# Patient Record
Sex: Male | Born: 1935
Health system: Southern US, Community
[De-identification: ages and names within clinical notes are randomized; demographics above are authoritative.]

## PROBLEM LIST (undated history)

## (undated) DIAGNOSIS — J90 Pleural effusion, not elsewhere classified: Secondary | ICD-10-CM

## (undated) DIAGNOSIS — R55 Syncope and collapse: Secondary | ICD-10-CM

## (undated) DIAGNOSIS — I4819 Other persistent atrial fibrillation: Secondary | ICD-10-CM

## (undated) DIAGNOSIS — N400 Enlarged prostate without lower urinary tract symptoms: Secondary | ICD-10-CM

## (undated) DIAGNOSIS — I10 Essential (primary) hypertension: Secondary | ICD-10-CM

## (undated) DIAGNOSIS — Z8719 Personal history of other diseases of the digestive system: Secondary | ICD-10-CM

## (undated) DIAGNOSIS — K219 Gastro-esophageal reflux disease without esophagitis: Secondary | ICD-10-CM

## (undated) DIAGNOSIS — I5032 Chronic diastolic (congestive) heart failure: Secondary | ICD-10-CM

## (undated) DIAGNOSIS — N281 Cyst of kidney, acquired: Secondary | ICD-10-CM

## (undated) DIAGNOSIS — I428 Other cardiomyopathies: Secondary | ICD-10-CM

## (undated) DIAGNOSIS — T8859XA Other complications of anesthesia, initial encounter: Secondary | ICD-10-CM

## (undated) DIAGNOSIS — G629 Polyneuropathy, unspecified: Secondary | ICD-10-CM

## (undated) DIAGNOSIS — T4145XA Adverse effect of unspecified anesthetic, initial encounter: Secondary | ICD-10-CM

## (undated) DIAGNOSIS — I5042 Chronic combined systolic (congestive) and diastolic (congestive) heart failure: Secondary | ICD-10-CM

## (undated) DIAGNOSIS — C4490 Unspecified malignant neoplasm of skin, unspecified: Secondary | ICD-10-CM

## (undated) DIAGNOSIS — M1990 Unspecified osteoarthritis, unspecified site: Secondary | ICD-10-CM

## (undated) DIAGNOSIS — I2510 Atherosclerotic heart disease of native coronary artery without angina pectoris: Secondary | ICD-10-CM

## (undated) DIAGNOSIS — R0600 Dyspnea, unspecified: Secondary | ICD-10-CM

## (undated) DIAGNOSIS — I441 Atrioventricular block, second degree: Secondary | ICD-10-CM

## (undated) HISTORY — PX: APPENDECTOMY: SHX54

## (undated) HISTORY — DX: Chronic combined systolic (congestive) and diastolic (congestive) heart failure: I50.42

## (undated) HISTORY — PX: CATARACT EXTRACTION, BILATERAL: SHX1313

## (undated) HISTORY — PX: TOTAL HIP ARTHROPLASTY: SHX124

## (undated) HISTORY — PX: BACK SURGERY: SHX140

## (undated) HISTORY — PX: LUMBAR DISC SURGERY: SHX700

## (undated) HISTORY — PX: CHOLECYSTECTOMY: SHX55

## (undated) HISTORY — PX: SKIN CANCER EXCISION: SHX779

## (undated) HISTORY — PX: JOINT REPLACEMENT: SHX530

## (undated) HISTORY — DX: Other cardiomyopathies: I42.8

## (undated) HISTORY — PX: TONSILLECTOMY: SUR1361

## (undated) HISTORY — DX: Atherosclerotic heart disease of native coronary artery without angina pectoris: I25.10

## (undated) HISTORY — DX: Other persistent atrial fibrillation: I48.19

---

## 1997-12-01 ENCOUNTER — Other Ambulatory Visit: Admission: RE | Admit: 1997-12-01 | Discharge: 1997-12-01 | Payer: Self-pay | Admitting: Urology

## 1998-12-29 ENCOUNTER — Ambulatory Visit (HOSPITAL_COMMUNITY): Admission: RE | Admit: 1998-12-29 | Discharge: 1998-12-29 | Payer: Self-pay | Admitting: Gastroenterology

## 2001-03-09 ENCOUNTER — Encounter: Payer: Self-pay | Admitting: Family Medicine

## 2001-03-09 ENCOUNTER — Encounter: Admission: RE | Admit: 2001-03-09 | Discharge: 2001-03-09 | Payer: Self-pay | Admitting: Family Medicine

## 2001-03-16 ENCOUNTER — Encounter: Payer: Self-pay | Admitting: Family Medicine

## 2001-03-16 ENCOUNTER — Encounter: Admission: RE | Admit: 2001-03-16 | Discharge: 2001-03-16 | Payer: Self-pay | Admitting: Family Medicine

## 2001-05-18 ENCOUNTER — Encounter: Payer: Self-pay | Admitting: Neurosurgery

## 2001-05-20 ENCOUNTER — Encounter: Payer: Self-pay | Admitting: Neurosurgery

## 2001-05-20 ENCOUNTER — Inpatient Hospital Stay (HOSPITAL_COMMUNITY): Admission: RE | Admit: 2001-05-20 | Discharge: 2001-05-21 | Payer: Self-pay | Admitting: Neurosurgery

## 2008-05-11 ENCOUNTER — Encounter: Admission: RE | Admit: 2008-05-11 | Discharge: 2008-05-11 | Payer: Self-pay | Admitting: Neurosurgery

## 2008-07-12 ENCOUNTER — Encounter: Admission: RE | Admit: 2008-07-12 | Discharge: 2008-07-28 | Payer: Self-pay | Admitting: Neurology

## 2010-09-28 NOTE — Op Note (Signed)
[place]  Patient:    Frank Elliott, NOHR Visit Number: [id number] MRN: [id number]          Service Type: SUR Location: [id number] Attending Physician:  Barton Fanny Dictated by:   Hewitt Shorts, M.D. Proc. Date: 05/20/01 Admit Date:  05/20/2001                             Operative Report  PREOPERATIVE DIAGNOSIS:  Lumbar stenosis.  POSTOPERATIVE DIAGNOSIS:  Lumbar stenosis.  OPERATION PERFORMED:  L3 to S1 lumbar laminectomy with microdissection.  SURGEON:  Hewitt Shorts, M.D.  ASSISTANT:  Stefani Dama, M.D.  ANESTHESIA:  General endotracheal.  INDICATIONS FOR PROCEDURE:  The patient is a 77 year old man who presented with left lumbar radiculopathy and was found to have severe stenosis of the L4-5 level, marked stenosis at the L3-4 level and moderate stenosis at the L5-S1 level.  A decision was made to proceed with a multilevel decompressive lumbar laminectomy.  DESCRIPTION OF PROCEDURE:  The patient was brought to the operating room and placed under general endotracheal anesthesia.  The patient was turned to prone position.  The lumbar region was prepped with Betadine soap and solution and draped in a sterile fashion.  The midline was infiltrated with local anesthetic with epinephrine.  An x-ray was taken and the L3 to S1 levels identified.  A midline incision was made and carried down to subcutaneous tissues.  Bipolar cautery and electrocautery were used to maintain hemostasis. The lumbar fascia was incised bilaterally and the paraspinal muscles were dissected from the spinous processes and lamina in subperiosteal fascia. Self-retaining retractors were placed and another x-ray was taken to confirm the localization of the L3, L4, L5 and S1 spinous process and lamina.  We then proceeded with a multilevel lumbar laminectomy using double action rongeurs, the [facility] Max drill and Kerrison punches.  The microscope was  draped and brought into the field to provide additional magnification, illumination and visualization.  The remainder of the procedure was performed using microdissection and microsurgical technique.  There was a synovial cyst to the right side at L4-5 that was adherent to the dura and as the laminectomy was performed, a small rent in the dura occurred.  This was closed with a running 6-0 Prolene suture.  A watertight closure was achieved.  Tisseal was placed over the repair.  The laminectomy was extended from the inferior aspect of L3 to the superior aspect of S1 and laterally so as to decompress the foramina. Exuberant ligamentum flavum was carefully removed and in the end, good decompression was achieved throughout.  Hemostasis was established with the use of Gelfoam soaked in thrombin and once decompression was completed and hemostasis established, we proceeded with closure.  The paraspinal muscles were approximated with interrupted undyed 1 Vicryl suture and the vertebral fascia was closed with interrupted undyed 1 Vicryl sutures and the subcutaneous and subcuticular layer were closed with interrupted inverted 2-0 undyed Vicryl sutures.  Skin edges were approximated with Dermabond.  The patient tolerated the procedure well.  Estimated blood loss for this procedure was 300 cc.  Sponge, needle and instrument counts were correct.  Following surgery, the patient was turned back to supine position to be reversed from anesthetic, extubated and transferred to the recovery room for further care. ictated by:   Hewitt Shorts, M.D. Attending Physician:  Barton Fanny DD:  05/20/01 TD:  05/20/01  Job: [id number]

## 2011-09-09 DIAGNOSIS — K219 Gastro-esophageal reflux disease without esophagitis: Secondary | ICD-10-CM | POA: Diagnosis not present

## 2011-09-09 DIAGNOSIS — N401 Enlarged prostate with lower urinary tract symptoms: Secondary | ICD-10-CM | POA: Diagnosis not present

## 2011-09-09 DIAGNOSIS — I10 Essential (primary) hypertension: Secondary | ICD-10-CM | POA: Diagnosis not present

## 2011-09-09 DIAGNOSIS — Z125 Encounter for screening for malignant neoplasm of prostate: Secondary | ICD-10-CM | POA: Diagnosis not present

## 2011-09-09 DIAGNOSIS — Z1331 Encounter for screening for depression: Secondary | ICD-10-CM | POA: Diagnosis not present

## 2011-10-18 DIAGNOSIS — J209 Acute bronchitis, unspecified: Secondary | ICD-10-CM | POA: Diagnosis not present

## 2011-10-18 DIAGNOSIS — N401 Enlarged prostate with lower urinary tract symptoms: Secondary | ICD-10-CM | POA: Diagnosis not present

## 2011-10-31 DIAGNOSIS — Z85828 Personal history of other malignant neoplasm of skin: Secondary | ICD-10-CM | POA: Diagnosis not present

## 2011-10-31 DIAGNOSIS — L570 Actinic keratosis: Secondary | ICD-10-CM | POA: Diagnosis not present

## 2011-11-02 DIAGNOSIS — K5900 Constipation, unspecified: Secondary | ICD-10-CM | POA: Diagnosis not present

## 2011-11-02 DIAGNOSIS — R110 Nausea: Secondary | ICD-10-CM | POA: Diagnosis not present

## 2011-11-08 DIAGNOSIS — R110 Nausea: Secondary | ICD-10-CM | POA: Diagnosis not present

## 2011-11-08 DIAGNOSIS — R197 Diarrhea, unspecified: Secondary | ICD-10-CM | POA: Diagnosis not present

## 2011-11-15 DIAGNOSIS — R110 Nausea: Secondary | ICD-10-CM | POA: Diagnosis not present

## 2011-11-17 ENCOUNTER — Emergency Department (HOSPITAL_BASED_OUTPATIENT_CLINIC_OR_DEPARTMENT_OTHER): Payer: Medicare Other

## 2011-11-17 ENCOUNTER — Emergency Department (HOSPITAL_BASED_OUTPATIENT_CLINIC_OR_DEPARTMENT_OTHER)
Admission: EM | Admit: 2011-11-17 | Discharge: 2011-11-18 | Disposition: A | Discharge status: Home / Self Care | Payer: Medicare Other | Attending: Emergency Medicine | Admitting: Emergency Medicine

## 2011-11-17 ENCOUNTER — Encounter (HOSPITAL_BASED_OUTPATIENT_CLINIC_OR_DEPARTMENT_OTHER): Payer: Self-pay | Admitting: *Deleted

## 2011-11-17 DIAGNOSIS — N509 Disorder of male genital organs, unspecified: Secondary | ICD-10-CM | POA: Diagnosis not present

## 2011-11-17 DIAGNOSIS — Z79899 Other long term (current) drug therapy: Secondary | ICD-10-CM | POA: Diagnosis not present

## 2011-11-17 DIAGNOSIS — K219 Gastro-esophageal reflux disease without esophagitis: Secondary | ICD-10-CM | POA: Diagnosis not present

## 2011-11-17 DIAGNOSIS — I10 Essential (primary) hypertension: Secondary | ICD-10-CM | POA: Insufficient documentation

## 2011-11-17 DIAGNOSIS — N23 Unspecified renal colic: Secondary | ICD-10-CM | POA: Diagnosis not present

## 2011-11-17 DIAGNOSIS — N201 Calculus of ureter: Secondary | ICD-10-CM | POA: Diagnosis not present

## 2011-11-17 DIAGNOSIS — K5730 Diverticulosis of large intestine without perforation or abscess without bleeding: Secondary | ICD-10-CM | POA: Diagnosis not present

## 2011-11-17 DIAGNOSIS — R319 Hematuria, unspecified: Secondary | ICD-10-CM | POA: Diagnosis not present

## 2011-11-17 HISTORY — DX: Polyneuropathy, unspecified: G62.9

## 2011-11-17 HISTORY — DX: Gastro-esophageal reflux disease without esophagitis: K21.9

## 2011-11-17 HISTORY — DX: Essential (primary) hypertension: I10

## 2011-11-17 HISTORY — DX: Benign prostatic hyperplasia without lower urinary tract symptoms: N40.0

## 2011-11-17 LAB — URINALYSIS, ROUTINE W REFLEX MICROSCOPIC
Bilirubin Urine: NEGATIVE
Ketones, ur: 15 mg/dL — AB
Protein, ur: NEGATIVE mg/dL
Urobilinogen, UA: 2 mg/dL — ABNORMAL HIGH (ref 0.0–1.0)

## 2011-11-17 NOTE — ED Notes (Signed)
Pt c/o testicular pain since 3 pm with some pain with urination

## 2011-11-17 NOTE — ED Notes (Signed)
MD at bedside. 

## 2011-11-17 NOTE — ED Provider Notes (Signed)
History     CSN: [id number]  Arrival date & time 11/17/11  2245   First MD Initiated Contact with Patient 11/17/11 2322      Chief Complaint  Patient presents with  . Dysuria    (Consider location/radiation/quality/duration/timing/severity/associated sxs/prior treatment) HPI This is an 78 year old male who has been on finasteride for 7 months due to BPH. He is complaining of dysuria and urinating small amounts since this afternoon. The dysuria is described as pain inside his penis when he urinates. He denies fever or chills. He has nausea but this is chronic and he has an upper endoscopy scheduled later this week to evaluate that. The discomfort is moderate. He is not aware of any hematuria.  Past Medical History  Diagnosis Date  . GERD (gastroesophageal reflux disease)   . Hypertension   . Neuropathy   . BPH (benign prostatic hyperplasia)     Past Surgical History  Procedure Date  . Joint replacement   . Appendectomy   . Cholecystectomy   . Tonsillectomy   . Back surgery     History reviewed. No pertinent family history.  History  Substance Use Topics  . Smoking status: Not on file  . Smokeless tobacco: Not on file  . Alcohol Use: No      Review of Systems  All other systems reviewed and are negative.    Allergies  Amoxicillin; Aspirin; Sulfa antibiotics; and Whiskey  Home Medications   Current Outpatient Rx  Name Route Sig Dispense Refill  . AMLODIPINE BESYLATE 10 MG PO TABS Oral Take 10 mg by mouth daily.    Marland Kitchen FINASTERIDE 5 MG PO TABS Oral Take 5 mg by mouth daily.    Marland Kitchen GABAPENTIN 100 MG PO CAPS Oral Take 100 mg by mouth 3 (three) times daily.    Marland Kitchen OMEPRAZOLE 10 MG PO CPDR Oral Take 10 mg by mouth daily.      BP 147/86  Pulse 88  Temp 97.8 F (36.6 C) (Oral)  Resp 19  Ht 5\' 9"  (1.753 m)  Wt 168 lb (76.204 kg)  BMI 24.81 kg/m2  SpO2 99%  Physical Exam General: Well-developed, well-nourished male in no acute distress; appearance consistent  with age of record HENT: normocephalic, atraumatic Eyes: pupils equal round and reactive to light; extraocular muscles intact; lens implants Neck: supple Heart: regular rate and rhythm Lungs: clear to auscultation bilaterally Abdomen: soft; nondistended; nontender; no masses or hepatosplenomegaly; bowel sounds present GU: No flank tenderness; no testicular tenderness or mass; Tanner 4 male, circumcised; urethral meatal stenosis Rectal: Normal sphincter tone; prostate mildly tender, enlarged but symmetric Extremities: No deformity; full range of motion; possible edema of lower leg Neurologic: Awake, alert, mildly confused; motor function intact in all extremities and symmetric; no facial droop Skin: Warm and dry Psychiatric: Normal mood and affect    ED Course  Procedures (including critical care time)    MDM   Nursing notes and vitals signs, including pulse oximetry, reviewed.  Summary of this visit's results, reviewed by myself:  Labs:  Results for orders placed during the hospital encounter of 11/17/11  URINALYSIS, ROUTINE W REFLEX MICROSCOPIC      Component Value Range   Color, Urine YELLOW  YELLOW   APPearance CLEAR  CLEAR   Specific Gravity, Urine 1.015  1.005 - 1.030   pH 6.5  5.0 - 8.0   Glucose, UA NEGATIVE  NEGATIVE mg/dL   Hgb urine dipstick LARGE (*) NEGATIVE   Bilirubin Urine NEGATIVE  NEGATIVE  Ketones, ur 15 (*) NEGATIVE mg/dL   Protein, ur NEGATIVE  NEGATIVE mg/dL   Urobilinogen, UA 2.0 (*) 0.0 - 1.0 mg/dL   Nitrite NEGATIVE  NEGATIVE   Leukocytes, UA NEGATIVE  NEGATIVE  URINE MICROSCOPIC-ADD ON      Component Value Range   Squamous Epithelial / LPF RARE  RARE   WBC, UA 0-2  <3 WBC/hpf   RBC / HPF TOO NUMEROUS TO COUNT  <3 RBC/hpf   Bacteria, UA RARE  RARE   Ct Abdomen Pelvis Wo Contrast  11/18/2011  *RADIOLOGY REPORT*  Clinical Data: Hematuria and testicular pain.  CT ABDOMEN AND PELVIS WITHOUT CONTRAST  Technique:  Multidetector CT imaging of the  abdomen and pelvis was performed following the standard protocol without intravenous contrast.  Comparison: None.  Findings: Several low density lesions are associated with both kidneys.  Some are central and others are in the parenchyma.  Some of these can be characterized as simple cysts.  It would be difficult to exclude hydronephrosis.  No evidence of hydroureter.  2 mm calculus at the left ureteropelvic junction.  Post cholecystectomy.  Liver, spleen, pancreas, adrenal glands are within normal limits.  Diverticulosis of the sigmoid colon without evidence of diverticulitis.  Degenerative and postoperative changes in the lumbar spine.  L4 compression fracture of indeterminate age.  There is a collection of calcifications within the mesentery on image 45 associated with soft tissue stranding but no evidence of an associated mass.  This is of unknown significance.  It is felt to represent a benign finding.  IMPRESSION: 2 mm calculus at the left ureteral vesicle junction.  There are several hypodensities in both kidneys likely representing benign cysts.  Left sided hydronephrosis is difficult to exclude.  Original Report Authenticated By: Donavan Burnet, M.D.             Hanley Seamen, MD 11/18/11 [phone]

## 2011-11-18 MED ORDER — HYDROCODONE-ACETAMINOPHEN 5-325 MG PO TABS: 1.0000 | ORAL_TABLET | Freq: Four times a day (QID) | ORAL | Status: AC | PRN

## 2011-11-18 MED ORDER — IBUPROFEN 400 MG PO TABS
400.0000 mg | ORAL_TABLET | Freq: Once | ORAL | Status: AC
  Administered 2011-11-18: 400 mg via ORAL
  Filled 2011-11-18: qty 1

## 2011-11-18 MED ORDER — IBUPROFEN 400 MG PO TABS: ORAL_TABLET | ORAL | Status: DC

## 2011-11-19 LAB — URINE CULTURE
Colony Count: NO GROWTH
Culture: NO GROWTH

## 2011-11-21 DIAGNOSIS — N201 Calculus of ureter: Secondary | ICD-10-CM | POA: Diagnosis not present

## 2011-11-21 DIAGNOSIS — N281 Cyst of kidney, acquired: Secondary | ICD-10-CM | POA: Diagnosis not present

## 2011-11-21 DIAGNOSIS — N401 Enlarged prostate with lower urinary tract symptoms: Secondary | ICD-10-CM | POA: Diagnosis not present

## 2011-11-22 DIAGNOSIS — R110 Nausea: Secondary | ICD-10-CM | POA: Diagnosis not present

## 2011-11-27 DIAGNOSIS — K2960 Other gastritis without bleeding: Secondary | ICD-10-CM | POA: Diagnosis not present

## 2011-11-27 DIAGNOSIS — K222 Esophageal obstruction: Secondary | ICD-10-CM | POA: Diagnosis not present

## 2011-11-27 DIAGNOSIS — Z1211 Encounter for screening for malignant neoplasm of colon: Secondary | ICD-10-CM | POA: Diagnosis not present

## 2011-11-27 DIAGNOSIS — K319 Disease of stomach and duodenum, unspecified: Secondary | ICD-10-CM | POA: Diagnosis not present

## 2011-11-28 DIAGNOSIS — R110 Nausea: Secondary | ICD-10-CM | POA: Diagnosis not present

## 2011-11-28 DIAGNOSIS — Z96649 Presence of unspecified artificial hip joint: Secondary | ICD-10-CM | POA: Diagnosis not present

## 2011-11-28 DIAGNOSIS — R109 Unspecified abdominal pain: Secondary | ICD-10-CM | POA: Diagnosis not present

## 2011-12-12 DIAGNOSIS — K649 Unspecified hemorrhoids: Secondary | ICD-10-CM | POA: Diagnosis not present

## 2012-01-01 DIAGNOSIS — Z1211 Encounter for screening for malignant neoplasm of colon: Secondary | ICD-10-CM | POA: Diagnosis not present

## 2012-01-01 DIAGNOSIS — K6289 Other specified diseases of anus and rectum: Secondary | ICD-10-CM | POA: Diagnosis not present

## 2012-01-01 DIAGNOSIS — K599 Functional intestinal disorder, unspecified: Secondary | ICD-10-CM | POA: Diagnosis not present

## 2012-01-01 DIAGNOSIS — D126 Benign neoplasm of colon, unspecified: Secondary | ICD-10-CM | POA: Diagnosis not present

## 2012-01-23 DIAGNOSIS — Z23 Encounter for immunization: Secondary | ICD-10-CM | POA: Diagnosis not present

## 2012-02-06 DIAGNOSIS — H40019 Open angle with borderline findings, low risk, unspecified eye: Secondary | ICD-10-CM | POA: Diagnosis not present

## 2012-02-06 DIAGNOSIS — Z961 Presence of intraocular lens: Secondary | ICD-10-CM | POA: Diagnosis not present

## 2012-03-10 DIAGNOSIS — R609 Edema, unspecified: Secondary | ICD-10-CM | POA: Diagnosis not present

## 2012-03-10 DIAGNOSIS — N401 Enlarged prostate with lower urinary tract symptoms: Secondary | ICD-10-CM | POA: Diagnosis not present

## 2012-03-10 DIAGNOSIS — G609 Hereditary and idiopathic neuropathy, unspecified: Secondary | ICD-10-CM | POA: Diagnosis not present

## 2012-03-10 DIAGNOSIS — I10 Essential (primary) hypertension: Secondary | ICD-10-CM | POA: Diagnosis not present

## 2012-04-01 DIAGNOSIS — R0602 Shortness of breath: Secondary | ICD-10-CM | POA: Diagnosis not present

## 2012-04-06 DIAGNOSIS — R0609 Other forms of dyspnea: Secondary | ICD-10-CM | POA: Diagnosis not present

## 2012-04-06 DIAGNOSIS — R609 Edema, unspecified: Secondary | ICD-10-CM | POA: Diagnosis not present

## 2012-04-06 DIAGNOSIS — R0989 Other specified symptoms and signs involving the circulatory and respiratory systems: Secondary | ICD-10-CM | POA: Diagnosis not present

## 2012-04-06 DIAGNOSIS — J90 Pleural effusion, not elsewhere classified: Secondary | ICD-10-CM | POA: Diagnosis not present

## 2012-04-06 DIAGNOSIS — Z79899 Other long term (current) drug therapy: Secondary | ICD-10-CM | POA: Diagnosis not present

## 2012-04-16 DIAGNOSIS — I509 Heart failure, unspecified: Secondary | ICD-10-CM | POA: Diagnosis not present

## 2012-04-16 DIAGNOSIS — J90 Pleural effusion, not elsewhere classified: Secondary | ICD-10-CM | POA: Diagnosis not present

## 2012-04-17 ENCOUNTER — Emergency Department (HOSPITAL_COMMUNITY): Payer: Medicare Other

## 2012-04-17 ENCOUNTER — Encounter (HOSPITAL_COMMUNITY): Payer: Self-pay | Admitting: *Deleted

## 2012-04-17 ENCOUNTER — Inpatient Hospital Stay (HOSPITAL_COMMUNITY)
Admission: EM | Admit: 2012-04-17 | Discharge: 2012-04-20 | DRG: 292 | Disposition: A | Discharge status: Home Health | Payer: Medicare Other | Attending: Internal Medicine | Admitting: Internal Medicine

## 2012-04-17 DIAGNOSIS — I509 Heart failure, unspecified: Secondary | ICD-10-CM | POA: Diagnosis not present

## 2012-04-17 DIAGNOSIS — I10 Essential (primary) hypertension: Secondary | ICD-10-CM | POA: Diagnosis present

## 2012-04-17 DIAGNOSIS — J4489 Other specified chronic obstructive pulmonary disease: Secondary | ICD-10-CM | POA: Diagnosis not present

## 2012-04-17 DIAGNOSIS — K219 Gastro-esophageal reflux disease without esophagitis: Secondary | ICD-10-CM | POA: Diagnosis present

## 2012-04-17 DIAGNOSIS — R0602 Shortness of breath: Secondary | ICD-10-CM

## 2012-04-17 DIAGNOSIS — I5021 Acute systolic (congestive) heart failure: Principal | ICD-10-CM | POA: Diagnosis present

## 2012-04-17 DIAGNOSIS — G589 Mononeuropathy, unspecified: Secondary | ICD-10-CM | POA: Diagnosis present

## 2012-04-17 DIAGNOSIS — Z79899 Other long term (current) drug therapy: Secondary | ICD-10-CM

## 2012-04-17 DIAGNOSIS — Z886 Allergy status to analgesic agent status: Secondary | ICD-10-CM

## 2012-04-17 DIAGNOSIS — N400 Enlarged prostate without lower urinary tract symptoms: Secondary | ICD-10-CM | POA: Diagnosis present

## 2012-04-17 DIAGNOSIS — Z880 Allergy status to penicillin: Secondary | ICD-10-CM

## 2012-04-17 DIAGNOSIS — I428 Other cardiomyopathies: Secondary | ICD-10-CM | POA: Diagnosis present

## 2012-04-17 DIAGNOSIS — Z881 Allergy status to other antibiotic agents status: Secondary | ICD-10-CM

## 2012-04-17 DIAGNOSIS — J449 Chronic obstructive pulmonary disease, unspecified: Secondary | ICD-10-CM | POA: Diagnosis not present

## 2012-04-17 DIAGNOSIS — Z882 Allergy status to sulfonamides status: Secondary | ICD-10-CM

## 2012-04-17 DIAGNOSIS — I5032 Chronic diastolic (congestive) heart failure: Secondary | ICD-10-CM | POA: Diagnosis present

## 2012-04-17 HISTORY — DX: Pleural effusion, not elsewhere classified: J90

## 2012-04-17 HISTORY — DX: Dyspnea, unspecified: R06.00

## 2012-04-17 LAB — POCT I-STAT TROPONIN I: Troponin i, poc: 0.03 ng/mL (ref 0.00–0.08)

## 2012-04-17 LAB — URINALYSIS, ROUTINE W REFLEX MICROSCOPIC
Hgb urine dipstick: NEGATIVE
Protein, ur: NEGATIVE mg/dL
Specific Gravity, Urine: 1.02 (ref 1.005–1.030)
Urobilinogen, UA: 1 mg/dL (ref 0.0–1.0)

## 2012-04-17 LAB — COMPREHENSIVE METABOLIC PANEL
ALT: 15 U/L (ref 0–53)
BUN: 24 mg/dL — ABNORMAL HIGH (ref 6–23)
Calcium: 9.1 mg/dL (ref 8.4–10.5)
Creatinine, Ser: 1.1 mg/dL (ref 0.50–1.35)
GFR calc Af Amer: 70 mL/min — ABNORMAL LOW (ref >90)
Glucose, Bld: 105 mg/dL — ABNORMAL HIGH (ref 70–99)
Sodium: 141 mEq/L (ref 135–145)
Total Protein: 6.6 g/dL (ref 6.0–8.3)

## 2012-04-17 LAB — CBC WITH DIFFERENTIAL/PLATELET
Eosinophils Absolute: 0.2 10*3/uL (ref 0.0–0.7)
Eosinophils Relative: 2 % (ref 0–5)
Lymphs Abs: 1.4 10*3/uL (ref 0.7–4.0)
MCH: 34.7 pg — ABNORMAL HIGH (ref 26.0–34.0)
MCV: 96.8 fL (ref 78.0–100.0)
Monocytes Absolute: 0.6 10*3/uL (ref 0.1–1.0)
Platelets: 254 10*3/uL (ref 150–400)
RBC: 4.32 MIL/uL (ref 4.22–5.81)

## 2012-04-17 LAB — PRO B NATRIURETIC PEPTIDE: Pro B Natriuretic peptide (BNP): 6386 pg/mL — ABNORMAL HIGH (ref 0–450)

## 2012-04-17 MED ORDER — FUROSEMIDE 10 MG/ML IJ SOLN
40.0000 mg | Freq: Once | INTRAMUSCULAR | Status: AC
  Administered 2012-04-18: 40 mg via INTRAVENOUS
  Filled 2012-04-17: qty 4

## 2012-04-17 NOTE — ED Notes (Signed)
Pt was dx with right sided pleural effusion on 11/20, family was told it was fairly large.  Tonight when checked by nurse, pt was tachycardia and SOB on exertion.  Has lost approx 10 lbs in 1 week.

## 2012-04-17 NOTE — ED Notes (Signed)
MD at bedside. 

## 2012-04-17 NOTE — ED Provider Notes (Addendum)
History     CSN: [id number]  Arrival date & time 04/17/12  2107   First MD Initiated Contact with Patient 04/17/12 2303      Chief Complaint  Patient presents with  . Tachycardia  . Pleural Effusion    (Consider location/radiation/quality/duration/timing/severity/associated sxs/prior treatment) HPI... increasing shortness of breath over the past several weeks. Seen by primary care Dr. and placed on Lasix for "fluid in his lungs."  10 pound weight loss in past week.  No previous MI or congestive heart failure. Exertion makes symptoms worse. Severity is moderate  Past Medical History  Diagnosis Date  . GERD (gastroesophageal reflux disease)   . Hypertension   . Neuropathy   . BPH (benign prostatic hyperplasia)   . Pleural effusion     dx 04/01/12  . Dyspnea     Past Surgical History  Procedure Date  . Joint replacement   . Appendectomy   . Cholecystectomy   . Tonsillectomy   . Back surgery     History reviewed. No pertinent family history.  History  Substance Use Topics  . Smoking status: Not on file  . Smokeless tobacco: Not on file  . Alcohol Use: No      Review of Systems  All other systems reviewed and are negative.    Allergies  Aspirin; Whiskey; Amoxicillin; Avelox; Lisinopril; Penicillins; Sulfa antibiotics; and Zithromax  Home Medications   Current Outpatient Rx  Name  Route  Sig  Dispense  Refill  . GABAPENTIN 300 MG PO CAPS   Oral   Take 300 mg by mouth 3 (three) times daily.         Marland Kitchen OMEPRAZOLE 20 MG PO CPDR   Oral   Take 20 mg by mouth 2 (two) times daily.         Marland Kitchen CALCIUM POLYCARBOPHIL 625 MG PO TABS   Oral   Take 625 mg by mouth at bedtime as needed. For constipation         . ALIGN PO   Oral   Take 1 capsule by mouth daily.         Marland Kitchen TAMSULOSIN HCL 0.4 MG PO CAPS   Oral   Take 0.4 mg by mouth daily.         . TRIAMCINOLONE ACETONIDE 55 MCG/ACT NA INHA   Nasal   Place 2 sprays into the nose at bedtime.            BP 154/86  Pulse 101  Temp 98.2 F (36.8 C)  Resp 21  SpO2 97%  Physical Exam  Nursing note and vitals reviewed. Constitutional: He is oriented to person, place, and time. He appears well-developed and well-nourished.       Patient is thin, no obvious dyspnea  HENT:  Head: Normocephalic and atraumatic.  Eyes: Conjunctivae normal and EOM are normal. Pupils are equal, round, and reactive to light.  Neck: Normal range of motion. Neck supple.  Cardiovascular: Normal rate, regular rhythm and normal heart sounds.   Pulmonary/Chest: Effort normal and breath sounds normal.  Abdominal: Soft. Bowel sounds are normal.  Musculoskeletal: Normal range of motion.  Neurological: He is alert and oriented to person, place, and time.  Skin: Skin is warm and dry.       2+ peripheral edema  Psychiatric: He has a normal mood and affect.    ED Course  Procedures (including critical care time)  Labs Reviewed  URINALYSIS, ROUTINE W REFLEX MICROSCOPIC - Abnormal; Notable for the following:  Bilirubin Urine SMALL (*)     All other components within normal limits  CBC WITH DIFFERENTIAL - Abnormal; Notable for the following:    MCH 34.7 (*)     All other components within normal limits  COMPREHENSIVE METABOLIC PANEL - Abnormal; Notable for the following:    Glucose, Bld 105 (*)     BUN 24 (*)     GFR calc non Af Amer 60 (*)     GFR calc Af Amer 70 (*)     All other components within normal limits  POCT I-STAT TROPONIN I  PRO B NATRIURETIC PEPTIDE   Dg Chest 2 View  04/17/2012  *RADIOLOGY REPORT*  Clinical Data: Shortness of breath.  Tachycardia.  CHEST - 2 VIEW  Comparison: 04/01/2012.  Findings: The cardiac silhouette remains borderline enlarged. Increased prominence of the pulmonary vasculature and interstitial markings with Kerley lines.  Small bilateral pleural effusions without significant change.  The lungs remain mildly hyperexpanded. Thoracic spine degenerative changes.   IMPRESSION: Mildly progressive changes of congestive heart failure superimposed on COPD.   Original Report Authenticated By: Beckie Salts, M.D.      No diagnosis found.    MDM  Suspect congestive heart failure versus pleural effusion.  Will most likely admit patient secondary to age and worsening symptoms  0015     chest x-ray suggests mild failure.  IV Lasix.  Admit to hospitalist      Donnetta Hutching, MD 04/17/12 7846  Donnetta Hutching, MD 04/18/12 [phone]

## 2012-04-18 ENCOUNTER — Encounter (HOSPITAL_COMMUNITY): Payer: Self-pay | Admitting: Nurse Practitioner

## 2012-04-18 DIAGNOSIS — J449 Chronic obstructive pulmonary disease, unspecified: Secondary | ICD-10-CM | POA: Diagnosis present

## 2012-04-18 DIAGNOSIS — Z880 Allergy status to penicillin: Secondary | ICD-10-CM | POA: Diagnosis not present

## 2012-04-18 DIAGNOSIS — I509 Heart failure, unspecified: Secondary | ICD-10-CM | POA: Diagnosis not present

## 2012-04-18 DIAGNOSIS — Z886 Allergy status to analgesic agent status: Secondary | ICD-10-CM | POA: Diagnosis not present

## 2012-04-18 DIAGNOSIS — K219 Gastro-esophageal reflux disease without esophagitis: Secondary | ICD-10-CM | POA: Diagnosis present

## 2012-04-18 DIAGNOSIS — I10 Essential (primary) hypertension: Secondary | ICD-10-CM | POA: Diagnosis not present

## 2012-04-18 DIAGNOSIS — R0602 Shortness of breath: Secondary | ICD-10-CM | POA: Diagnosis not present

## 2012-04-18 DIAGNOSIS — I5032 Chronic diastolic (congestive) heart failure: Secondary | ICD-10-CM | POA: Diagnosis present

## 2012-04-18 DIAGNOSIS — I5021 Acute systolic (congestive) heart failure: Secondary | ICD-10-CM | POA: Diagnosis not present

## 2012-04-18 DIAGNOSIS — I428 Other cardiomyopathies: Secondary | ICD-10-CM | POA: Diagnosis present

## 2012-04-18 DIAGNOSIS — Z79899 Other long term (current) drug therapy: Secondary | ICD-10-CM | POA: Diagnosis not present

## 2012-04-18 DIAGNOSIS — J4489 Other specified chronic obstructive pulmonary disease: Secondary | ICD-10-CM | POA: Diagnosis not present

## 2012-04-18 DIAGNOSIS — Z881 Allergy status to other antibiotic agents status: Secondary | ICD-10-CM | POA: Diagnosis not present

## 2012-04-18 DIAGNOSIS — N400 Enlarged prostate without lower urinary tract symptoms: Secondary | ICD-10-CM | POA: Diagnosis present

## 2012-04-18 DIAGNOSIS — Z882 Allergy status to sulfonamides status: Secondary | ICD-10-CM | POA: Diagnosis not present

## 2012-04-18 DIAGNOSIS — G589 Mononeuropathy, unspecified: Secondary | ICD-10-CM | POA: Diagnosis present

## 2012-04-18 LAB — CBC
HCT: 39.8 % (ref 39.0–52.0)
MCH: 32.7 pg (ref 26.0–34.0)
MCHC: 34.2 g/dL (ref 30.0–36.0)
MCV: 95.7 fL (ref 78.0–100.0)
Platelets: 231 10*3/uL (ref 150–400)
RDW: 13.3 % (ref 11.5–15.5)
WBC: 7 10*3/uL (ref 4.0–10.5)

## 2012-04-18 LAB — BASIC METABOLIC PANEL
BUN: 22 mg/dL (ref 6–23)
CO2: 27 mEq/L (ref 19–32)
Calcium: 8.7 mg/dL (ref 8.4–10.5)
Chloride: 105 mEq/L (ref 96–112)
Creatinine, Ser: 1.14 mg/dL (ref 0.50–1.35)
Glucose, Bld: 86 mg/dL (ref 70–99)

## 2012-04-18 MED ORDER — FLUTICASONE PROPIONATE 50 MCG/ACT NA SUSP
1.0000 | Freq: Every day | NASAL | Status: DC
  Administered 2012-04-20: 1 via NASAL
  Filled 2012-04-18: qty 16

## 2012-04-18 MED ORDER — CALCIUM POLYCARBOPHIL 625 MG PO TABS
625.0000 mg | ORAL_TABLET | Freq: Every evening | ORAL | Status: DC | PRN
  Administered 2012-04-18 – 2012-04-20 (×2): 625 mg via ORAL
  Filled 2012-04-18 (×3): qty 1

## 2012-04-18 MED ORDER — ENOXAPARIN SODIUM 30 MG/0.3ML ~~LOC~~ SOLN
30.0000 mg | SUBCUTANEOUS | Status: DC
  Administered 2012-04-18 – 2012-04-19 (×2): 30 mg via SUBCUTANEOUS
  Filled 2012-04-18 (×2): qty 0.3

## 2012-04-18 MED ORDER — HYDROCODONE-ACETAMINOPHEN 5-325 MG PO TABS: 1.0000 | ORAL_TABLET | Freq: Four times a day (QID) | ORAL | Status: DC | PRN

## 2012-04-18 MED ORDER — FUROSEMIDE 10 MG/ML IJ SOLN
40.0000 mg | Freq: Three times a day (TID) | INTRAMUSCULAR | Status: DC
  Administered 2012-04-18 – 2012-04-19 (×4): 40 mg via INTRAVENOUS
  Filled 2012-04-18 (×7): qty 4

## 2012-04-18 MED ORDER — ONDANSETRON HCL 4 MG/2ML IJ SOLN: 4.0000 mg | Freq: Four times a day (QID) | INTRAMUSCULAR | Status: DC | PRN

## 2012-04-18 MED ORDER — SODIUM CHLORIDE 0.9 % IJ SOLN
3.0000 mL | Freq: Two times a day (BID) | INTRAMUSCULAR | Status: DC
  Administered 2012-04-18 – 2012-04-20 (×6): 3 mL via INTRAVENOUS

## 2012-04-18 MED ORDER — PANTOPRAZOLE SODIUM 40 MG PO TBEC
40.0000 mg | DELAYED_RELEASE_TABLET | Freq: Every day | ORAL | Status: DC
  Administered 2012-04-18 – 2012-04-20 (×3): 40 mg via ORAL
  Filled 2012-04-18 (×3): qty 1

## 2012-04-18 MED ORDER — FUROSEMIDE 10 MG/ML IJ SOLN: 40.0000 mg | Freq: Two times a day (BID) | INTRAMUSCULAR | Status: DC

## 2012-04-18 MED ORDER — HYDROCODONE-ACETAMINOPHEN 5-325 MG PO TABS: 1.0000 | ORAL_TABLET | ORAL | Status: DC | PRN

## 2012-04-18 MED ORDER — POTASSIUM CHLORIDE CRYS ER 20 MEQ PO TBCR
40.0000 meq | EXTENDED_RELEASE_TABLET | Freq: Two times a day (BID) | ORAL | Status: DC
  Administered 2012-04-18 – 2012-04-20 (×5): 40 meq via ORAL
  Filled 2012-04-18 (×7): qty 2

## 2012-04-18 MED ORDER — GABAPENTIN 300 MG PO CAPS
300.0000 mg | ORAL_CAPSULE | Freq: Three times a day (TID) | ORAL | Status: DC
  Administered 2012-04-18 – 2012-04-20 (×7): 300 mg via ORAL
  Filled 2012-04-18 (×9): qty 1

## 2012-04-18 MED ORDER — ACETAMINOPHEN 325 MG PO TABS
650.0000 mg | ORAL_TABLET | Freq: Four times a day (QID) | ORAL | Status: DC | PRN
  Administered 2012-04-18 (×2): 650 mg via ORAL
  Filled 2012-04-18 (×2): qty 2

## 2012-04-18 MED ORDER — TAMSULOSIN HCL 0.4 MG PO CAPS
0.4000 mg | ORAL_CAPSULE | Freq: Every day | ORAL | Status: DC
  Administered 2012-04-18 – 2012-04-20 (×3): 0.4 mg via ORAL
  Filled 2012-04-18 (×3): qty 1

## 2012-04-18 MED ORDER — ONDANSETRON HCL 4 MG PO TABS: 4.0000 mg | ORAL_TABLET | Freq: Four times a day (QID) | ORAL | Status: DC | PRN

## 2012-04-18 MED ORDER — RISAQUAD PO CAPS
1.0000 | ORAL_CAPSULE | Freq: Every day | ORAL | Status: DC
  Administered 2012-04-18 – 2012-04-20 (×3): 1 via ORAL
  Filled 2012-04-18 (×3): qty 1

## 2012-04-18 NOTE — H&P (Signed)
[facility] History and Physical  Frank Elliott ZOX:[id number] DOB: [birth year] DOA: 04/17/2012  Referring physician: ED physician PCP: No primary provider on file.   Chief Complaint: Shortness of breath   HPI:  Pt is 78 yo male who presents with main concern or progressively worsening shortness of breath that initially started 2-3 weeks prior to admission and have been associated with generalized fatigue and poor oral intake. Shortness of breath was initially only exertional but now more frequent at rest. Pt denies fevers, chills, abdominal or urinary concerns, no similar events in the past. Pt reports seeing PCP and was started on Lasix and has lost some weight but is not sure how many pounds.  Assessment and Plan:  Principal Problem:  *SOB (shortness of breath) - likely multifactorial and secondary to progressive CHF and imposed on COPD - will admit the pt to telemetry floor - please note that pt has gotten one dose of Lasix 40 mg IV - will monitor daily weights, I's and O's - monitor renal function Active Problems:  CHF (congestive heart failure) - will obtain 2 D ECHO for further analysis - management as noted above  COPD (chronic obstructive pulmonary disease) - monitor vitals per floor protocol - pt maintaining oxygen saturations at target range   Code Status: Full Family Communication: Pt at bedside Disposition Plan: Admit to telemetry floor    Review of Systems:  Constitutional: Negative for fever, chills and malaise/fatigue. Negative for diaphoresis.  HENT: Negative for hearing loss, ear pain, nosebleeds, congestion, sore throat, neck pain, tinnitus and ear discharge.   Eyes: Negative for blurred vision, double vision, photophobia, pain, discharge and redness.  Respiratory: Negative for cough, hemoptysis, sputum production, positive for shortness of breath   Cardiovascular: Negative for chest pain, palpitations, orthopnea, claudication and leg swelling.   Gastrointestinal: Negative for nausea, vomiting and abdominal pain. Negative for heartburn, constipation, blood in stool and melena.  Genitourinary: Negative for dysuria, urgency, frequency, hematuria and flank pain.  Musculoskeletal: Negative for myalgias, back pain, joint pain and falls.  Skin: Negative for itching and rash.  Neurological: Negative for dizziness and weakness. Negative for tingling, tremors, sensory change, speech change, focal weakness, loss of consciousness and headaches.  Endo/Heme/Allergies: Negative for environmental allergies and polydipsia. Does not bruise/bleed easily.  Psychiatric/Behavioral: Negative for suicidal ideas. The patient is not nervous/anxious.      Past Medical History  Diagnosis Date  . GERD (gastroesophageal reflux disease)   . Hypertension   . Neuropathy   . BPH (benign prostatic hyperplasia)   . Pleural effusion     dx 04/01/12  . Dyspnea     Past Surgical History  Procedure Date  . Joint replacement   . Appendectomy   . Cholecystectomy   . Tonsillectomy   . Back surgery     Social History:  does not have a smoking history on file. He does not have any smokeless tobacco history on file. He reports that he does not drink alcohol. His drug history not on file.  Allergies  Allergen Reactions  . Aspirin Anaphylaxis, Hives and Swelling  . Whiskey (Alcohol) Anaphylaxis and Hives  . Amoxicillin Other (See Comments)    Causes sore throat  . Avelox (Moxifloxacin Hcl In Nacl) Other (See Comments)    Caused diarrhea & constipation  . Lisinopril Cough  . Penicillins Other (See Comments)    Unknown reaction  . Sulfa Antibiotics Hives  . Zithromax (Azithromycin) Other (See Comments)    Sore throat  No family history of cancers.  Prior to Admission medications   Medication Sig Start Date End Date Taking? Authorizing Provider  gabapentin (NEURONTIN) 300 MG capsule Take 300 mg by mouth 3 (three) times daily.   Yes Historical Provider,  MD  omeprazole (PRILOSEC) 20 MG capsule Take 20 mg by mouth 2 (two) times daily.   Yes Historical Provider, MD  polycarbophil (FIBERCON) 625 MG tablet Take 625 mg by mouth at bedtime as needed. For constipation   Yes Historical Provider, MD  Probiotic Product (ALIGN PO) Take 1 capsule by mouth daily.   Yes Historical Provider, MD  Tamsulosin HCl (FLOMAX) 0.4 MG CAPS Take 0.4 mg by mouth daily.   Yes Historical Provider, MD  triamcinolone (NASACORT) 55 MCG/ACT nasal inhaler Place 2 sprays into the nose at bedtime.   Yes Historical Provider, MD    Physical Exam: Filed Vitals:   04/17/12 2300 04/17/12 2330 04/18/12 0000 04/18/12 0003  BP: 146/88 141/97 138/91 138/91  Pulse: 98 100 95 96  Temp:    98.5 F (36.9 C)  TempSrc:    Oral  Resp: 21 20 23 23   SpO2: 97% 99% 100% 99%    Physical Exam  Constitutional: Appears well-developed and well-nourished. No distress.  HENT: Normocephalic. External right and left ear normal. Oropharynx is clear and moist.  Eyes: Conjunctivae and EOM are normal. PERRLA, no scleral icterus.  Neck: Normal ROM. Neck supple. No JVD. No tracheal deviation. No thyromegaly.  CVS: RRR, S1/S2 +, no murmurs, no gallops, no carotid bruit.  Pulmonary: Bibasilar crackles, no wheezing Abdominal: Soft. BS +,  no distension, tenderness, rebound or guarding.  Musculoskeletal: Normal range of motion. No edema and no tenderness.  Lymphadenopathy: No lymphadenopathy noted, cervical, inguinal. Neuro: Alert. Normal reflexes, muscle tone coordination. No cranial nerve deficit. Skin: Skin is warm and dry. No rash noted. Not diaphoretic. No erythema. No pallor.  Psychiatric: Normal mood and affect. Behavior, judgment, thought content normal.   Labs on Admission:  Basic Metabolic Panel:  Lab 04/17/12 9604  NA 141  K 3.7  CL 105  CO2 22  GLUCOSE 105*  BUN 24*  CREATININE 1.10  CALCIUM 9.1  MG --  PHOS --   Liver Function Tests:  Lab 04/17/12 2229  AST 20  ALT 15   ALKPHOS 82  BILITOT 0.6  PROT 6.6  ALBUMIN 3.6   CBC:  Lab 04/17/12 2229  WBC 8.4  NEUTROABS 6.2  HGB 15.0  HCT 41.8  MCV 96.8  PLT 254   Radiological Exams on Admission: Dg Chest 2 View  04/17/2012  *RADIOLOGY REPORT*  Clinical Data: Shortness of breath.  Tachycardia.  CHEST - 2 VIEW  Comparison: 04/01/2012.  Findings: The cardiac silhouette remains borderline enlarged. Increased prominence of the pulmonary vasculature and interstitial markings with Kerley lines.  Small bilateral pleural effusions without significant change.  The lungs remain mildly hyperexpanded. Thoracic spine degenerative changes.  IMPRESSION: Mildly progressive changes of congestive heart failure superimposed on COPD.   Original Report Authenticated By: Beckie Salts, M.D.     EKG: Normal sinus rhythm, no ST/T wave changes  Debbora Presto, MD  [facility] Pager [phone]  If 7PM-7AM, please contact night-coverage [email or web address] Password [email or web address] 04/18/2012, 12:16 AM

## 2012-04-18 NOTE — Progress Notes (Signed)
Pt seen and examined, admitted earlier this am with Dyspnea/CHF exacerbation WIll continue lasix 40mg  Q8, I/Os, weights Request ECHO report from [facility] cardiology  Zannie Cove, MD [phone]

## 2012-04-18 NOTE — ED Notes (Signed)
Family at bedside. 

## 2012-04-19 LAB — BASIC METABOLIC PANEL
BUN: 24 mg/dL — ABNORMAL HIGH (ref 6–23)
Calcium: 9.1 mg/dL (ref 8.4–10.5)
Chloride: 102 mEq/L (ref 96–112)
Creatinine, Ser: 1.35 mg/dL (ref 0.50–1.35)
GFR calc Af Amer: 54 mL/min — ABNORMAL LOW (ref >90)
GFR calc non Af Amer: 47 mL/min — ABNORMAL LOW (ref >90)

## 2012-04-19 MED ORDER — ENOXAPARIN SODIUM 40 MG/0.4ML ~~LOC~~ SOLN
40.0000 mg | SUBCUTANEOUS | Status: DC
  Administered 2012-04-20: 40 mg via SUBCUTANEOUS
  Filled 2012-04-19: qty 0.4

## 2012-04-19 MED ORDER — FUROSEMIDE 40 MG PO TABS
40.0000 mg | ORAL_TABLET | Freq: Two times a day (BID) | ORAL | Status: DC
  Administered 2012-04-19 – 2012-04-20 (×2): 40 mg via ORAL
  Filled 2012-04-19 (×5): qty 1

## 2012-04-19 NOTE — Progress Notes (Signed)
[facility]             Progress Note   Subjective: Breathing much better, wants to go home  Objective: Vital signs in last 24 hours: Temp:  [97.2 F (36.2 C)-98.3 F (36.8 C)] 97.2 F (36.2 C) (12/08 0502) Pulse Rate:  [78-82] 78  (12/08 0502) Resp:  [18-20] 18  (12/08 0502) BP: (115-120)/(67-74) 115/67 mmHg (12/08 0502) SpO2:  [95 %-98 %] 98 % (12/08 0502) Weight:  [73.5 kg (162 lb 0.6 oz)] 73.5 kg (162 lb 0.6 oz) (12/08 0502) Weight change: -1.797 kg (-3 lb 15.4 oz) Last BM Date: 04/17/12  Intake/Output from previous day: 12/07 0701 - 12/08 0700 In: 1080 [P.O.:1080] Out: 3100 [Urine:3100] Total I/O In: -  Out: 100 [Urine:100]   Physical Exam: General: Alert, awake, oriented x3, in no acute distress. HEENT: No bruits, no goiter. Heart: Regular rate and rhythm, without murmurs, rubs, gallops. Lungs: Clear to auscultation bilaterally. Abdomen: Soft, nontender, nondistended, positive bowel sounds. Extremities: No clubbing cyanosis or edema with positive pedal pulses. Neuro: Grossly intact, nonfocal.    Lab Results: Basic Metabolic Panel:  Basename 04/19/12 0632 04/18/12 0535  NA 141 142  K 3.9 3.4*  CL 102 105  CO2 27 27  GLUCOSE 99 86  BUN 24* 22  CREATININE 1.35 1.14  CALCIUM 9.1 8.7  MG -- --  PHOS -- --   Liver Function Tests:  [facility] 04/17/12 2229  AST 20  ALT 15  ALKPHOS 82  BILITOT 0.6  PROT 6.6  ALBUMIN 3.6   No results found for this basename: LIPASE:2,AMYLASE:2 in the last 72 hours No results found for this basename: AMMONIA:2 in the last 72 hours CBC:  Basename 04/18/12 0535 04/17/12 2229  WBC 7.0 8.4  NEUTROABS -- 6.2  HGB 13.6 15.0  HCT 39.8 41.8  MCV 95.7 96.8  PLT 231 254   Cardiac Enzymes: No results found for this basename: CKTOTAL:3,CKMB:3,CKMBINDEX:3,TROPONINI:3 in the last 72 hours BNP:  [facility] 04/17/12 2229  PROBNP 6386.0*   D-Dimer: No results found for this basename: DDIMER:2 in the last 72  hours CBG: No results found for this basename: GLUCAP:6 in the last 72 hours Hemoglobin A1C: No results found for this basename: HGBA1C in the last 72 hours Fasting Lipid Panel: No results found for this basename: CHOL,HDL,LDLCALC,TRIG,CHOLHDL,LDLDIRECT in the last 72 hours Thyroid Function Tests: No results found for this basename: TSH,T4TOTAL,FREET4,T3FREE,THYROIDAB in the last 72 hours Anemia Panel: No results found for this basename: VITAMINB12,FOLATE,FERRITIN,TIBC,IRON,RETICCTPCT in the last 72 hours Coagulation: No results found for this basename: LABPROT:2,INR:2 in the last 72 hours Urine Drug Screen: Drugs of Abuse  No results found for this basename: labopia, cocainscrnur, labbenz, amphetmu, thcu, labbarb    Alcohol Level: No results found for this basename: ETH:2 in the last 72 hours Urinalysis:  Basename 04/17/12 2242  COLORURINE YELLOW  LABSPEC 1.020  PHURINE 5.5  GLUCOSEU NEGATIVE  HGBUR NEGATIVE  BILIRUBINUR SMALL*  KETONESUR NEGATIVE  PROTEINUR NEGATIVE  UROBILINOGEN 1.0  NITRITE NEGATIVE  LEUKOCYTESUR NEGATIVE    No results found for this or any previous visit (from the past 240 hour(s)).  Studies/Results: Dg Chest 2 View  04/17/2012  *RADIOLOGY REPORT*  Clinical Data: Shortness of breath.  Tachycardia.  CHEST - 2 VIEW  Comparison: 04/01/2012.  Findings: The cardiac silhouette remains borderline enlarged. Increased prominence of the pulmonary vasculature and interstitial markings with Kerley lines.  Small bilateral pleural effusions without significant change.  The lungs remain mildly hyperexpanded. Thoracic spine  degenerative changes.  IMPRESSION: Mildly progressive changes of congestive heart failure superimposed on COPD.   Original Report Authenticated By: Beckie Salts, M.D.     Medications: Scheduled Meds:   . acidophilus  1 capsule Oral Daily  . enoxaparin (LOVENOX) injection  30 mg Subcutaneous Q24H  . fluticasone  1 spray Each Nare Daily  .  furosemide  40 mg Intravenous Q8H  . furosemide  40 mg Oral Q12H  . gabapentin  300 mg Oral TID  . pantoprazole  40 mg Oral Daily  . potassium chloride  40 mEq Oral BID  . sodium chloride  3 mL Intravenous Q12H  . Tamsulosin HCl  0.4 mg Oral Daily   Continuous Infusions:  PRN Meds:.acetaminophen, HYDROcodone-acetaminophen, ondansetron (ZOFRAN) IV, ondansetron, polycarbophil  Assessment/Plan:  *1. CHF exacerbation, likely diastolic,  ECHO done on Thursday at [facility], will get report Monday Diuresing well, change lasix to PO  - will monitor daily weights, I's and O's  - monitor renal function   2. COPD (chronic obstructive pulmonary disease)  - stable  3. HTN: stable  4. GERD: PPI  DVT proph: lovenox  Code Status: Full  Family Communication: Pt at bedside  Disposition Plan: Admit to telemetry floor    Time spent coordinating care:   LOS: 2 days   [facility] Pager: [phone] 04/19/2012, 11:46 AM

## 2012-04-20 DIAGNOSIS — I10 Essential (primary) hypertension: Secondary | ICD-10-CM | POA: Diagnosis present

## 2012-04-20 MED ORDER — LOSARTAN POTASSIUM 25 MG PO TABS: 25.0000 mg | ORAL_TABLET | Freq: Every day | ORAL | Status: DC

## 2012-04-20 MED ORDER — METOPROLOL TARTRATE 12.5 MG HALF TABLET
12.5000 mg | ORAL_TABLET | Freq: Two times a day (BID) | ORAL | Status: DC
  Administered 2012-04-20: 12.5 mg via ORAL
  Filled 2012-04-20 (×2): qty 1

## 2012-04-20 MED ORDER — POTASSIUM CHLORIDE CRYS ER 20 MEQ PO TBCR: 40.0000 meq | EXTENDED_RELEASE_TABLET | Freq: Two times a day (BID) | ORAL | Status: DC

## 2012-04-20 MED ORDER — FUROSEMIDE 40 MG PO TABS: 40.0000 mg | ORAL_TABLET | Freq: Two times a day (BID) | ORAL | Status: DC

## 2012-04-20 NOTE — Discharge Summary (Signed)
Physician Discharge Summary  Patient ID: Frank Elliott MRN: [id number] DOB/AGE: 01/08/1926 78 y.o.  Admit date: 04/17/2012 Discharge date: 04/20/2012  Primary Care Physician:  Dr.Fried  Cardiologist: Dr.Henry Halina Andreas cardiology  Disposition and Follow-up:  Dr.Smith, [facility] cardiology on  Wednesday or Thursday, office will call Bmet in 2-3 days at Follow up  Discharge Diagnoses:   Acute Systolic CHF Cardiomyopathy: EF 20-25% HTN COPD (chronic obstructive pulmonary disease) GERD     Medication List     As of 04/20/2012  9:38 AM    TAKE these medications         ALIGN PO   Take 1 capsule by mouth daily.      furosemide 40 MG tablet   Commonly known as: LASIX   Take 1 tablet (40 mg total) by mouth every 12 (twelve) hours.      gabapentin 300 MG capsule   Commonly known as: NEURONTIN   Take 300 mg by mouth 3 (three) times daily.      losartan 25 MG tablet   Commonly known as: COZAAR   Take 1 tablet (25 mg total) by mouth daily.      omeprazole 20 MG capsule   Commonly known as: PRILOSEC   Take 20 mg by mouth 2 (two) times daily.      polycarbophil 625 MG tablet   Commonly known as: FIBERCON   Take 625 mg by mouth at bedtime as needed. For constipation      potassium chloride SA 20 MEQ tablet   Commonly known as: K-DUR,KLOR-CON   Take 2 tablets (40 mEq total) by mouth 2 (two) times daily.      Tamsulosin HCl 0.4 MG Caps   Commonly known as: FLOMAX   Take 0.4 mg by mouth daily.      triamcinolone 55 MCG/ACT nasal inhaler   Commonly known as: NASACORT   Place 2 sprays into the nose at bedtime.         Significant Diagnostic Studies:  No results found. CHEST - 2 VIEW  Comparison: 04/01/2012.  Findings: The cardiac silhouette remains borderline enlarged. Increased prominence of the pulmonary vasculature and interstitial markings with Kerley lines. Small bilateral pleural effusions without significant change. The lungs remain mildly  hyperexpanded. Thoracic spine degenerative changes.  IMPRESSION: Mildly progressive changes of congestive heart failure superimposed on COPD.    Brief H and P: Pt is 78 yo male who presents with main concern or progressively worsening shortness of breath that initially started 2-3 weeks prior to admission and have been associated with generalized fatigue and poor oral intake. Shortness of breath was initially only exertional but now more frequent at rest. Pt denies fevers, chills, abdominal or urinary concerns, no similar events in the past. Pt reports seeing PCP and was started on Lasix and has lost some weight but is not sure how many pounds.    Hospital Course:  1. CHF exacerbation, Acute Systolic CHF ECHO done on Thursday at [facility], got report today 12/9, EF is 20-25% Diuresed well with IV lasix, 3.5L negative, weight down 11lbs, creatinine stable, normal Clinically improved, and anxious to go home since yesterday I called and discussed with Dr.Smith today, will discharge home on metoprolol, PO lasix, KCl and small dose of ARB, he will follow up with Dr.Smith in 2-3 days. Will need Bmet at follow up, defer need for ischemia eval to Dr.Smith  2. COPD (chronic obstructive pulmonary disease)  - stable   3. HTN: stable   4.  GERD: PPI      Time spent on Discharge:  Signed: [name] [facility]  04/20/2012, 9:38 AM

## 2012-04-20 NOTE — Progress Notes (Signed)
Discharge instructions, prescriptions, And appointments reviewed with pt, wife, and pastor at beside.  No questions or concerns.  Pt and wife discharged via wheelchairs from volunteer services. [address] [facility]

## 2012-04-21 NOTE — Progress Notes (Signed)
Utilization Review Completed.   Manvir Prabhu, RN, BSN Nurse Case Manager  [phone]  

## 2012-04-22 DIAGNOSIS — I5021 Acute systolic (congestive) heart failure: Secondary | ICD-10-CM | POA: Diagnosis not present

## 2012-04-22 DIAGNOSIS — G479 Sleep disorder, unspecified: Secondary | ICD-10-CM | POA: Diagnosis not present

## 2012-04-22 DIAGNOSIS — I447 Left bundle-branch block, unspecified: Secondary | ICD-10-CM | POA: Diagnosis not present

## 2012-04-22 DIAGNOSIS — N401 Enlarged prostate with lower urinary tract symptoms: Secondary | ICD-10-CM | POA: Diagnosis not present

## 2012-04-22 DIAGNOSIS — I10 Essential (primary) hypertension: Secondary | ICD-10-CM | POA: Diagnosis not present

## 2012-04-27 DIAGNOSIS — I5020 Unspecified systolic (congestive) heart failure: Secondary | ICD-10-CM | POA: Diagnosis not present

## 2012-04-27 DIAGNOSIS — I10 Essential (primary) hypertension: Secondary | ICD-10-CM | POA: Diagnosis not present

## 2012-04-27 DIAGNOSIS — N183 Chronic kidney disease, stage 3 unspecified: Secondary | ICD-10-CM | POA: Diagnosis not present

## 2012-04-27 DIAGNOSIS — J90 Pleural effusion, not elsewhere classified: Secondary | ICD-10-CM | POA: Diagnosis not present

## 2012-04-29 DIAGNOSIS — I447 Left bundle-branch block, unspecified: Secondary | ICD-10-CM | POA: Diagnosis not present

## 2012-04-29 DIAGNOSIS — I10 Essential (primary) hypertension: Secondary | ICD-10-CM | POA: Diagnosis not present

## 2012-04-29 DIAGNOSIS — I5021 Acute systolic (congestive) heart failure: Secondary | ICD-10-CM | POA: Diagnosis not present

## 2012-04-30 DIAGNOSIS — L570 Actinic keratosis: Secondary | ICD-10-CM | POA: Diagnosis not present

## 2012-05-10 ENCOUNTER — Other Ambulatory Visit (HOSPITAL_COMMUNITY): Payer: Self-pay | Admitting: Internal Medicine

## 2012-06-10 DIAGNOSIS — I447 Left bundle-branch block, unspecified: Secondary | ICD-10-CM | POA: Diagnosis not present

## 2012-06-10 DIAGNOSIS — I5022 Chronic systolic (congestive) heart failure: Secondary | ICD-10-CM | POA: Diagnosis not present

## 2012-06-10 DIAGNOSIS — I5021 Acute systolic (congestive) heart failure: Secondary | ICD-10-CM | POA: Diagnosis not present

## 2012-06-10 DIAGNOSIS — I10 Essential (primary) hypertension: Secondary | ICD-10-CM | POA: Diagnosis not present

## 2012-06-29 DIAGNOSIS — I5022 Chronic systolic (congestive) heart failure: Secondary | ICD-10-CM | POA: Diagnosis not present

## 2012-07-22 DIAGNOSIS — I5021 Acute systolic (congestive) heart failure: Secondary | ICD-10-CM | POA: Diagnosis not present

## 2012-07-22 DIAGNOSIS — I447 Left bundle-branch block, unspecified: Secondary | ICD-10-CM | POA: Diagnosis not present

## 2012-07-22 DIAGNOSIS — I10 Essential (primary) hypertension: Secondary | ICD-10-CM | POA: Diagnosis not present

## 2012-08-18 DIAGNOSIS — M25539 Pain in unspecified wrist: Secondary | ICD-10-CM | POA: Diagnosis not present

## 2012-10-07 DIAGNOSIS — K1329 Other disturbances of oral epithelium, including tongue: Secondary | ICD-10-CM | POA: Diagnosis not present

## 2012-10-07 DIAGNOSIS — K1370 Unspecified lesions of oral mucosa: Secondary | ICD-10-CM | POA: Diagnosis not present

## 2012-10-23 DIAGNOSIS — I10 Essential (primary) hypertension: Secondary | ICD-10-CM | POA: Diagnosis not present

## 2012-10-23 DIAGNOSIS — I5021 Acute systolic (congestive) heart failure: Secondary | ICD-10-CM | POA: Diagnosis not present

## 2012-10-23 DIAGNOSIS — I447 Left bundle-branch block, unspecified: Secondary | ICD-10-CM | POA: Diagnosis not present

## 2012-10-23 DIAGNOSIS — M25539 Pain in unspecified wrist: Secondary | ICD-10-CM | POA: Diagnosis not present

## 2012-10-26 DIAGNOSIS — I10 Essential (primary) hypertension: Secondary | ICD-10-CM | POA: Diagnosis not present

## 2012-10-26 DIAGNOSIS — Z1331 Encounter for screening for depression: Secondary | ICD-10-CM | POA: Diagnosis not present

## 2012-10-26 DIAGNOSIS — M25539 Pain in unspecified wrist: Secondary | ICD-10-CM | POA: Diagnosis not present

## 2012-11-06 DIAGNOSIS — I5021 Acute systolic (congestive) heart failure: Secondary | ICD-10-CM | POA: Diagnosis not present

## 2012-12-09 DIAGNOSIS — I10 Essential (primary) hypertension: Secondary | ICD-10-CM | POA: Diagnosis not present

## 2012-12-23 DIAGNOSIS — L708 Other acne: Secondary | ICD-10-CM | POA: Diagnosis not present

## 2012-12-23 DIAGNOSIS — L570 Actinic keratosis: Secondary | ICD-10-CM | POA: Diagnosis not present

## 2012-12-23 DIAGNOSIS — D485 Neoplasm of uncertain behavior of skin: Secondary | ICD-10-CM | POA: Diagnosis not present

## 2013-01-19 DIAGNOSIS — Z23 Encounter for immunization: Secondary | ICD-10-CM | POA: Diagnosis not present

## 2013-02-10 DIAGNOSIS — H40019 Open angle with borderline findings, low risk, unspecified eye: Secondary | ICD-10-CM | POA: Diagnosis not present

## 2013-02-10 DIAGNOSIS — Z961 Presence of intraocular lens: Secondary | ICD-10-CM | POA: Diagnosis not present

## 2013-04-16 ENCOUNTER — Telehealth: Payer: Self-pay

## 2013-04-16 MED ORDER — CARVEDILOL 6.25 MG PO TABS: 6.2500 mg | ORAL_TABLET | Freq: Two times a day (BID) | ORAL | Status: DC

## 2013-04-16 MED ORDER — LOSARTAN POTASSIUM 25 MG PO TABS: 25.0000 mg | ORAL_TABLET | Freq: Every day | ORAL | Status: DC

## 2013-04-16 NOTE — Telephone Encounter (Signed)
done

## 2013-04-21 ENCOUNTER — Encounter: Payer: Self-pay | Admitting: Interventional Cardiology

## 2013-04-22 ENCOUNTER — Other Ambulatory Visit: Payer: Self-pay

## 2013-04-22 ENCOUNTER — Other Ambulatory Visit: Payer: Self-pay | Admitting: *Deleted

## 2013-04-22 ENCOUNTER — Ambulatory Visit (INDEPENDENT_AMBULATORY_CARE_PROVIDER_SITE_OTHER): Payer: Medicare Other | Admitting: Interventional Cardiology

## 2013-04-22 VITALS — BP 134/76 | HR 60 | Ht 69.0 in | Wt 163.0 lb

## 2013-04-22 DIAGNOSIS — I509 Heart failure, unspecified: Secondary | ICD-10-CM

## 2013-04-22 DIAGNOSIS — I10 Essential (primary) hypertension: Secondary | ICD-10-CM

## 2013-04-22 DIAGNOSIS — I5022 Chronic systolic (congestive) heart failure: Secondary | ICD-10-CM

## 2013-04-22 DIAGNOSIS — J449 Chronic obstructive pulmonary disease, unspecified: Secondary | ICD-10-CM | POA: Diagnosis not present

## 2013-04-22 LAB — BASIC METABOLIC PANEL
CO2: 28 mEq/L (ref 19–32)
Chloride: 107 mEq/L (ref 96–112)
Sodium: 142 mEq/L (ref 135–145)

## 2013-04-22 MED ORDER — CARVEDILOL 6.25 MG PO TABS: 6.2500 mg | ORAL_TABLET | Freq: Two times a day (BID) | ORAL | Status: DC

## 2013-04-22 MED ORDER — LOSARTAN POTASSIUM 100 MG PO TABS: 100.0000 mg | ORAL_TABLET | Freq: Every day | ORAL | Status: DC

## 2013-04-22 NOTE — Telephone Encounter (Signed)
refill 

## 2013-04-22 NOTE — Patient Instructions (Signed)
Continue following a diet low in salt  You will need lab work today: BMP    We will call you with your results  Your physician recommends that you schedule a follow-up appointment in: 6 months with Dr. Katrinka Blazing

## 2013-04-22 NOTE — Progress Notes (Signed)
Patient ID: SEAMUS WAREHIME, male   DOB: 13-Jul-1925, 79 y.o.   MRN: [id number] Past Medical History  Congestive Heart Failure, systolic and EF 25% by echo 04/2012, improving to 35-40%; by echo 10/2012   BPH   GERD   Constipation      [address] [place], [place]  [address] Phone: [phone] Fax:  [phone]  Date:  04/22/2013   ID:  KAMARIUS BUCKBEE, DOB 01-29-1926, MRN [id number]  PCP:  No primary provider on file.   ASSESSMENT:  1. Chronic systolic heart failure, stable 2. Hypertension, stable  PLAN:  1. Continue current medical regimen 2. Six-month followup 3. Low salt diet   SUBJECTIVE: LONELL STAMOS is a 79 y.o. male who complains of decreased energy. He denies orthopnea and dyspnea on exertion. No significant edema. He has not had palpitations or syncope. He denies chest pain per   Wt Readings from Last 3 Encounters:  04/22/13 163 lb (73.936 kg)  04/20/12 156 lb 11.2 oz (71.079 kg)  11/17/11 168 lb (76.204 kg)     Past Medical History  Diagnosis Date  . GERD (gastroesophageal reflux disease)   . Hypertension   . Neuropathy   . BPH (benign prostatic hyperplasia)   . Pleural effusion     dx 04/01/12  . Dyspnea     Current Outpatient Prescriptions  Medication Sig Dispense Refill  . carvedilol (COREG) 6.25 MG tablet Take 1 tablet (6.25 mg total) by mouth 2 (two) times daily.  60 tablet  6  . finasteride (PROSCAR) 5 MG tablet       . furosemide (LASIX) 40 MG tablet Take 1 tablet (40 mg total) by mouth every 12 (twelve) hours.  30 tablet  0  . gabapentin (NEURONTIN) 300 MG capsule Take 300 mg by mouth 3 (three) times daily.      Marland Kitchen losartan (COZAAR) 100 MG tablet Take 100 mg by mouth daily.      Marland Kitchen omeprazole (PRILOSEC) 20 MG capsule Take 20 mg by mouth 2 (two) times daily.      . polycarbophil (FIBERCON) 625 MG tablet Take 625 mg by mouth at bedtime as needed. For constipation      . Probiotic Product (ALIGN PO) Take 1 capsule by mouth daily.       . Tamsulosin HCl (FLOMAX) 0.4 MG CAPS Take 0.4 mg by mouth daily.      Marland Kitchen triamcinolone (NASACORT) 55 MCG/ACT nasal inhaler Place 2 sprays into the nose at bedtime.       No current facility-administered medications for this visit.    Allergies:    Allergies  Allergen Reactions  . Aspirin Anaphylaxis, Hives and Swelling  . Whiskey [Alcohol] Anaphylaxis and Hives  . Amoxicillin Other (See Comments)    Causes sore throat  . Avelox [Moxifloxacin Hcl In Nacl] Other (See Comments)    Caused diarrhea & constipation  . Lisinopril Cough  . Penicillins Other (See Comments)    Unknown reaction  . Sulfa Antibiotics Hives  . Zithromax [Azithromycin] Other (See Comments)    Sore throat    Social History:  The patient  reports that he has never smoked. He has never used smokeless tobacco. He reports that he does not drink alcohol or use illicit drugs.   ROS:  Please see the history of present illness.   Appetite is good. Denies complaints.   All other systems reviewed and negative.   OBJECTIVE: VS:  BP 134/76  Pulse 60  Ht 5\' 9"  (1.753 m)  Wt 163 lb (73.936 kg)  BMI 24.06 kg/m2 Well nourished, well developed, in no acute distress, Elderly HEENT: normal Neck: JVD Flat. Carotid bruit 2+  Cardiac:  normal S1, S2; RRR; no murmur Lungs:  clear to auscultation bilaterally, no wheezing, rhonchi or rales Abd: soft, nontender, no hepatomegaly Ext: Edema absent. Pulses 2+ Skin: warm and dry Neuro:  CNs 2-12 intact, no focal abnormalities noted  EKG:  Sinus bradycardia at 60 beats per minute. LVH with strain.       Signed, Darci Needle III, MD 04/22/2013 2:00 PM

## 2013-04-25 ENCOUNTER — Encounter: Payer: Self-pay | Admitting: Interventional Cardiology

## 2013-04-25 DIAGNOSIS — I5022 Chronic systolic (congestive) heart failure: Secondary | ICD-10-CM | POA: Insufficient documentation

## 2013-04-26 ENCOUNTER — Telehealth: Payer: Self-pay

## 2013-04-26 NOTE — Telephone Encounter (Signed)
Message copied by Jarvis Newcomer on Mon Apr 26, 2013  8:27 AM ------      Message from: Verdis Prime      Created: Thu Apr 22, 2013  5:57 PM       The laboratory data is very stable. ------

## 2013-04-26 NOTE — Telephone Encounter (Signed)
pt wife aware of lab results.  The laboratory data is very stable. pt verbalized understanding.

## 2013-05-18 ENCOUNTER — Other Ambulatory Visit: Payer: Self-pay | Admitting: *Deleted

## 2013-05-18 MED ORDER — FUROSEMIDE 20 MG PO TABS: 20.0000 mg | ORAL_TABLET | Freq: Every day | ORAL | Status: DC

## 2013-10-28 ENCOUNTER — Other Ambulatory Visit: Payer: Self-pay | Admitting: *Deleted

## 2013-10-28 MED ORDER — LOSARTAN POTASSIUM 100 MG PO TABS: 100.0000 mg | ORAL_TABLET | Freq: Every day | ORAL | Status: DC

## 2013-11-01 ENCOUNTER — Ambulatory Visit (INDEPENDENT_AMBULATORY_CARE_PROVIDER_SITE_OTHER): Payer: Medicare Other | Admitting: Interventional Cardiology

## 2013-11-01 ENCOUNTER — Encounter: Payer: Self-pay | Admitting: Interventional Cardiology

## 2013-11-01 VITALS — BP 100/60 | HR 68 | Ht 69.0 in | Wt 163.0 lb

## 2013-11-01 DIAGNOSIS — I5022 Chronic systolic (congestive) heart failure: Secondary | ICD-10-CM | POA: Diagnosis not present

## 2013-11-01 DIAGNOSIS — I10 Essential (primary) hypertension: Secondary | ICD-10-CM

## 2013-11-01 DIAGNOSIS — J410 Simple chronic bronchitis: Secondary | ICD-10-CM | POA: Diagnosis not present

## 2013-11-01 MED ORDER — LOSARTAN POTASSIUM 50 MG PO TABS
50.0000 mg | ORAL_TABLET | Freq: Every day | ORAL | Status: DC
Start: 2013-11-01 — End: 2013-11-18

## 2013-11-01 NOTE — Progress Notes (Signed)
Patient ID: Frank Elliott, male   DOB: January 19, 1926, 80 y.o.   MRN: [id number]    [address] [place], [place]  [address] Phone: [phone] Fax:  [phone]  Date:  11/01/2013   ID:  Frank Elliott, DOB [birth year], MRN [id number]  PCP:  No primary provider on file.   ASSESSMENT:  1. Chronic systolic heart failure, stable with last EF in the 35-40% range nearly one year ago 2. Fatigue, possibly do to relatively low blood pressure 3. Blood pressure, well controlled  PLAN:  1. Decrease losartan to 50 mg daily 2. 2-D Doppler echocardiogram 3. Clinical followup in 6 months 4. If decreasing losartan does not improve fatigue, we'll need to look for other sources   SUBJECTIVE: Frank Elliott is a 80 y.o. male who complains of fatigue and lightheadedness particularly orthostatic. He denies orthopnea. There is exertional dyspnea. This is improved compared to when we first diagnosed heart failure. He has not had edema. His appetite is good.   Wt Readings from Last 3 Encounters:  11/01/13 163 lb (73.936 kg)  04/22/13 163 lb (73.936 kg)  04/20/12 156 lb 11.2 oz (71.079 kg)     Past Medical History  Diagnosis Date  . GERD (gastroesophageal reflux disease)   . Hypertension   . Neuropathy   . BPH (benign prostatic hyperplasia)   . Pleural effusion     dx 04/01/12  . Dyspnea     Current Outpatient Prescriptions  Medication Sig Dispense Refill  . carvedilol (COREG) 6.25 MG tablet Take 1 tablet (6.25 mg total) by mouth 2 (two) times daily.  60 tablet  6  . finasteride (PROSCAR) 5 MG tablet Take 5 mg by mouth daily.       . fluticasone (FLONASE) 50 MCG/ACT nasal spray Place 1 spray into both nostrils daily.       . furosemide (LASIX) 20 MG tablet Take 1 tablet (20 mg total) by mouth daily.  30 tablet  6  . gabapentin (NEURONTIN) 300 MG capsule Take 300 mg by mouth 3 (three) times daily.      Marland Kitchen losartan (COZAAR) 100 MG tablet Take 1 tablet (100 mg total) by mouth daily.   30 tablet  0  . omeprazole (PRILOSEC) 20 MG capsule Take 20 mg by mouth 2 (two) times daily.      . polycarbophil (FIBERCON) 625 MG tablet Take 625 mg by mouth at bedtime as needed. For constipation      . Probiotic Product (ALIGN PO) Take 1 capsule by mouth daily.      Marland Kitchen triamcinolone (NASACORT) 55 MCG/ACT nasal inhaler Place 2 sprays into the nose at bedtime.       No current facility-administered medications for this visit.    Allergies:    Allergies  Allergen Reactions  . Aspirin Anaphylaxis, Hives and Swelling  . Whiskey [Alcohol] Anaphylaxis and Hives  . Amoxicillin Other (See Comments)    Causes sore throat  . Avelox [Moxifloxacin Hcl In Nacl] Other (See Comments)    Caused diarrhea & constipation  . Lisinopril Cough  . Penicillins Other (See Comments)    Unknown reaction  . Sulfa Antibiotics Hives  . Zithromax [Azithromycin] Other (See Comments)    Sore throat    Social History:  The patient  reports that he has never smoked. He has never used smokeless tobacco. He reports that he does not drink alcohol or use illicit drugs.   ROS:  Please see the history of  present illness.     All other systems reviewed and negative.   OBJECTIVE: VS:  BP 100/60  Pulse 68  Ht 5\' 9"  (1.753 m)  Wt 163 lb (73.936 kg)  BMI 24.06 kg/m2 Well nourished, well developed, in no acute distress, elderly but healthy appearing HEENT: normal Neck: JVD flat. Carotid bruit absent  Cardiac:  normal S1, S2; RRR; no murmur Lungs:  clear to auscultation bilaterally, no wheezing, rhonchi or rales Abd: soft, nontender, no hepatomegaly Ext: Edema absent. Pulses 2+ Skin: warm and dry Neuro:  CNs 2-12 intact, no focal abnormalities noted  EKG:  Not repeated       Signed, Illene Labrador III, MD 11/01/2013 4:06 PM

## 2013-11-01 NOTE — Patient Instructions (Signed)
Your physician has recommended you make the following change in your medication:  1) DECREASE Losartan to 50mg  daily  Your physician has requested that you have an echocardiogram. Echocardiography is a painless test that uses sound waves to create images of your heart. It provides your doctor with information about the size and shape of your heart and how well your heart's chambers and valves are working. This procedure takes approximately one hour. There are no restrictions for this procedure.  Your physician wants you to follow-up in: 6 months You will receive a reminder letter in the mail two months in advance. If you don't receive a letter, please call our office to schedule the follow-up appointment.

## 2013-11-02 ENCOUNTER — Ambulatory Visit (HOSPITAL_COMMUNITY): Payer: Medicare Other | Attending: Cardiology | Admitting: Cardiology

## 2013-11-02 ENCOUNTER — Encounter: Payer: Self-pay | Admitting: Interventional Cardiology

## 2013-11-02 DIAGNOSIS — J449 Chronic obstructive pulmonary disease, unspecified: Secondary | ICD-10-CM | POA: Insufficient documentation

## 2013-11-02 DIAGNOSIS — I509 Heart failure, unspecified: Secondary | ICD-10-CM | POA: Insufficient documentation

## 2013-11-02 DIAGNOSIS — I10 Essential (primary) hypertension: Secondary | ICD-10-CM | POA: Diagnosis not present

## 2013-11-02 DIAGNOSIS — R0602 Shortness of breath: Secondary | ICD-10-CM | POA: Diagnosis not present

## 2013-11-02 DIAGNOSIS — R0609 Other forms of dyspnea: Secondary | ICD-10-CM | POA: Insufficient documentation

## 2013-11-02 DIAGNOSIS — J4489 Other specified chronic obstructive pulmonary disease: Secondary | ICD-10-CM | POA: Insufficient documentation

## 2013-11-02 DIAGNOSIS — I5022 Chronic systolic (congestive) heart failure: Secondary | ICD-10-CM

## 2013-11-02 DIAGNOSIS — R0989 Other specified symptoms and signs involving the circulatory and respiratory systems: Secondary | ICD-10-CM | POA: Insufficient documentation

## 2013-11-02 NOTE — Progress Notes (Signed)
Echo performed. 

## 2013-11-03 ENCOUNTER — Telehealth: Payer: Self-pay

## 2013-11-03 NOTE — Telephone Encounter (Signed)
Message copied by [facility]ar Laundry on Wed Nov 03, 2013  3:40 PM ------      Message from: Daneen Schick      Created: Wed Nov 03, 2013  1:43 PM       Frank Elliott  heart function has returned to normal and that we can further modify his medication by discontinuing furosemide. Please have him to call if he starts having edema or shortness of breath ------

## 2013-11-03 NOTE — Telephone Encounter (Signed)
pt called back.pt given echo and Dr.Smith's recommendations.Mr. Frank Elliott  heart function has returned to normal and that we can further modify his medication by discontinuing furosemide. Please have him to call if he starts having edema or shortness of breath.pt verbalized understanding.

## 2013-11-03 NOTE — Telephone Encounter (Signed)
called to give pt echo results and Dr.Smith's recommendations.lmtcb

## 2013-11-10 ENCOUNTER — Telehealth: Payer: Self-pay | Admitting: Interventional Cardiology

## 2013-11-10 NOTE — Telephone Encounter (Signed)
pt adv that Ispoke with Dr.Allred about his 3lb weight gain x1 wk after stopping Lasix. Pt instructed to continue to weigh daily. pt should use his current weigh as baseline. if weight goes up more than 3lbs, sob or edema devlops pt should call the office. Pt verbalized understanding

## 2013-11-10 NOTE — Telephone Encounter (Signed)
returned pt call. pt sts that since lasix was d/c 1 wk ago he is up 3lbs in weight. pt denies edema, sob. pt sts that he is concerned about weight gain and wnts to know if should resume lasix. adv pt Dr.Smith is out of the office, I will talk with another physician in the office and call back with their recommendations, pt agreeable with plan and verbalized understanding.

## 2013-11-10 NOTE — Telephone Encounter (Signed)
Patient has gained 3lbs within a weeks time. He wants to know if he should be put back on his Lasix. Please call and advise.

## 2013-11-13 NOTE — Telephone Encounter (Signed)
I agree with this approach. 

## 2013-11-18 ENCOUNTER — Other Ambulatory Visit: Payer: Self-pay | Admitting: *Deleted

## 2013-11-18 DIAGNOSIS — I10 Essential (primary) hypertension: Secondary | ICD-10-CM

## 2013-11-18 MED ORDER — LOSARTAN POTASSIUM 50 MG PO TABS: 50.0000 mg | ORAL_TABLET | Freq: Every day | ORAL | Status: DC

## 2013-11-24 NOTE — Telephone Encounter (Signed)
Spoke with Frank Elliott and he thinks his weight is about 161 lbs, which is about the same as what it was 2 weeks ago. Frank Elliott c/o of lower extremity edema in his feet and ankles and he gets "swimmy headed" at times (same as what he has had in the past). Frank Elliott denies SOB. Frank Elliott wants to know if Dr. [name] wants him to go back on fluid pill or if he can just stay off of it.

## 2013-11-24 NOTE — Telephone Encounter (Signed)
New Prob    Pt states he has been retaining fluid. Pt also mentions decreased appetite. He is requesting to speak to a nurse. Please call.

## 2013-11-26 NOTE — Telephone Encounter (Signed)
If ankle are without swelling when he awakens each morning and there is no worsening or new shortness of breath, he can stay of lasix. Needs to monitor weight, edema, and breathing. Call if any changes.

## 2013-11-29 DIAGNOSIS — R350 Frequency of micturition: Secondary | ICD-10-CM | POA: Diagnosis not present

## 2013-11-29 DIAGNOSIS — N410 Acute prostatitis: Secondary | ICD-10-CM | POA: Diagnosis not present

## 2013-11-29 NOTE — Telephone Encounter (Addendum)
Spoke with pt and he feels SOB is about the same and weight today was around 160lbs. Pt thinks his ankles and feet are slightly swollen. Pt will continue to monitor and call back if symptoms worsen.

## 2013-12-14 DIAGNOSIS — L259 Unspecified contact dermatitis, unspecified cause: Secondary | ICD-10-CM | POA: Diagnosis not present

## 2013-12-14 DIAGNOSIS — L570 Actinic keratosis: Secondary | ICD-10-CM | POA: Diagnosis not present

## 2013-12-18 ENCOUNTER — Other Ambulatory Visit: Payer: Self-pay | Admitting: Interventional Cardiology

## 2014-01-03 ENCOUNTER — Telehealth: Payer: Self-pay | Admitting: Interventional Cardiology

## 2014-01-03 DIAGNOSIS — I10 Essential (primary) hypertension: Secondary | ICD-10-CM

## 2014-01-03 MED ORDER — LOSARTAN POTASSIUM 100 MG PO TABS: 100.0000 mg | ORAL_TABLET | Freq: Every day | ORAL | Status: DC

## 2014-01-03 NOTE — Telephone Encounter (Signed)
New problem   Pt need to speak to nurse about his blood pressure being high. Please call pt.

## 2014-01-03 NOTE — Telephone Encounter (Signed)
Dr Johnsie Cancel DOD aware of pt's of high  BP, and recommend for pt to increased Losartan to 100 mg once a day.A prescription send to pt's pharmacy . Pt is aware.

## 2014-01-03 NOTE — Telephone Encounter (Signed)
Pt states that for the last 2 to 3 days his BP has been high usually has been 409 systolic, now is runing 811/91. Pt took his BP medication at 8:00 AM this morning. His BP now is 176/81. Pt said that Dr. [name] decreased his Lozartan medication from 100 mg to 50 mg on 11/01/13, because of C/O of fatigue. Pt states he  is fatigue since he was diagnosed with systolic heart failure.

## 2014-01-08 ENCOUNTER — Encounter: Payer: Self-pay | Admitting: Interventional Cardiology

## 2014-01-25 DIAGNOSIS — Z23 Encounter for immunization: Secondary | ICD-10-CM | POA: Diagnosis not present

## 2014-02-07 DIAGNOSIS — F4321 Adjustment disorder with depressed mood: Secondary | ICD-10-CM | POA: Diagnosis not present

## 2014-02-07 DIAGNOSIS — IMO0002 Reserved for concepts with insufficient information to code with codable children: Secondary | ICD-10-CM | POA: Diagnosis not present

## 2014-02-07 DIAGNOSIS — I10 Essential (primary) hypertension: Secondary | ICD-10-CM | POA: Diagnosis not present

## 2014-02-07 DIAGNOSIS — N401 Enlarged prostate with lower urinary tract symptoms: Secondary | ICD-10-CM | POA: Diagnosis not present

## 2014-02-07 DIAGNOSIS — D7589 Other specified diseases of blood and blood-forming organs: Secondary | ICD-10-CM | POA: Diagnosis not present

## 2014-02-07 DIAGNOSIS — R21 Rash and other nonspecific skin eruption: Secondary | ICD-10-CM | POA: Diagnosis not present

## 2014-02-07 DIAGNOSIS — N138 Other obstructive and reflux uropathy: Secondary | ICD-10-CM | POA: Diagnosis not present

## 2014-02-10 ENCOUNTER — Telehealth: Payer: Self-pay | Admitting: Interventional Cardiology

## 2014-02-10 NOTE — Telephone Encounter (Signed)
I would recommend that he speak to his primary care physician. If he is not short of breath with swelling I doubt this has anything to do with a heart.

## 2014-02-10 NOTE — Telephone Encounter (Signed)
Returned pt call. Pt sts that he has loss 3lbs in a 1 month period. pt weight this morning was 153lb. Pt denies sob or edema. Pt sts that he is concerned about his weight loss. Pt sts that he has been under a lot of stress lately, he son lost his job and he has been trying to help him out financially.pt measures his bp reguarly and his systolic usually runs 088'P-103'P. Pt st that a couple of weeks ago he did not have much of an appetite and was not eating much.pt sts that his appetite has returned to normal. Pt losartan was increased back to 100mg  daily and a month ago because of elevated bp and he relates the increase to his weight lost.adv pt that I dont think the increase and weight loss are related. Pt complains of being fatigued all the time fatigued all the time.adv pt to f/u with his pcp for weight loss. Adv pt I would f/u an update to Dr.Smith and call back with his recommendations. Pt agreeable and verbalized understanding.

## 2014-02-10 NOTE — Telephone Encounter (Signed)
New message     Talk to the nurse.  Pt is losing weight and he does not know why.  He was put on lorsartan and do not know if it is making him lose weight.  Please call

## 2014-02-11 NOTE — Telephone Encounter (Signed)
called to give pt Dr.Smith recommendations for pt to f/u with his pcp.pt sts that he did see Dr.Fried yessterday and he gave his was given a mediication to increase his appetite and something f.or anxiety.pt did not know the names of the medications because he has not picked them up as yet. Adv pt to bring all his meds with him at his next appt with Dr.Smith so we can make sure his med list id updated. Pt adv to call if cardiac symptoms develop. Pt agreeable and verbalized understanding.

## 2014-02-28 DIAGNOSIS — R21 Rash and other nonspecific skin eruption: Secondary | ICD-10-CM | POA: Diagnosis not present

## 2014-03-16 DIAGNOSIS — L859 Epidermal thickening, unspecified: Secondary | ICD-10-CM | POA: Diagnosis not present

## 2014-03-16 DIAGNOSIS — M2022 Hallux rigidus, left foot: Secondary | ICD-10-CM | POA: Diagnosis not present

## 2014-03-22 DIAGNOSIS — Z636 Dependent relative needing care at home: Secondary | ICD-10-CM | POA: Diagnosis not present

## 2014-03-22 DIAGNOSIS — N401 Enlarged prostate with lower urinary tract symptoms: Secondary | ICD-10-CM | POA: Diagnosis not present

## 2014-03-22 DIAGNOSIS — R21 Rash and other nonspecific skin eruption: Secondary | ICD-10-CM | POA: Diagnosis not present

## 2014-04-27 ENCOUNTER — Encounter: Payer: Self-pay | Admitting: Interventional Cardiology

## 2014-04-27 ENCOUNTER — Ambulatory Visit (INDEPENDENT_AMBULATORY_CARE_PROVIDER_SITE_OTHER): Payer: Medicare Other | Admitting: Interventional Cardiology

## 2014-04-27 VITALS — BP 160/68 | HR 68 | Ht 69.0 in | Wt 160.8 lb

## 2014-04-27 DIAGNOSIS — I5032 Chronic diastolic (congestive) heart failure: Secondary | ICD-10-CM | POA: Diagnosis not present

## 2014-04-27 DIAGNOSIS — I5022 Chronic systolic (congestive) heart failure: Secondary | ICD-10-CM

## 2014-04-27 NOTE — Patient Instructions (Signed)
Your physician recommends that you continue on your current medications as directed. Please refer to the Current Medication list given to you today.  Your physician wants you to follow-up in: 6-9 months You will receive a reminder letter in the mail two months in advance. If you don't receive a letter, please call our office to schedule the follow-up appointment.  

## 2014-04-27 NOTE — Progress Notes (Signed)
Patient ID: ISIAH SCHEEL, male   DOB: [birth year], 80 y.o.   MRN: [id number]    [address], [address], [place]  [address] Phone: [phone] Fax:  [phone]  Date:  04/27/2014   ID:  PEARLEY BARANEK, DOB 12/05/1925, MRN [id number]  PCP:  No primary care provider on file.   ASSESSMENT:  1. Chronic diastolic heart failure with conversion from systolic heart failure with EF in the 20s to normal EF in June 2015 with no heart failure symptoms 2. Hypertension, borderline controlled 3. Resolution of dyspnea  PLAN:  1. Continue current medical regimen and follow blood pressure closely 2. Blood pressure continues to be higher than 140/90, consider adding back a very low dose of diuretic 3. Clinical follow-up in one year   SUBJECTIVE: Abishai REEGAN BOUFFARD is a 80 y.o. male who is doing well. LV function has recovered back to normal systolic function. He has no dyspnea. He denies orthopnea PND. Both he and his wife spoke to me about taking her own is a patient. She sees Dr. Irish Lack. I will believe it would be appropriate for her to start seeing me as Dr. Irish Lack would be a better long-term physician for her.  He denies anorexia, orthopnea, PND, syncope, and palpitations.   Wt Readings from Last 3 Encounters:  04/27/14 160 lb 12.8 oz (72.938 kg)  11/01/13 163 lb (73.936 kg)  04/22/13 163 lb (73.936 kg)     Past Medical History  Diagnosis Date  . GERD (gastroesophageal reflux disease)   . Hypertension   . Neuropathy   . BPH (benign prostatic hyperplasia)   . Pleural effusion     dx 04/01/12  . Dyspnea     Current Outpatient Prescriptions  Medication Sig Dispense Refill  . carvedilol (COREG) 6.25 MG tablet Take 1 tablet (6.25 mg total) by mouth 2 (two) times daily. 60 tablet 6  . fluticasone (FLONASE) 50 MCG/ACT nasal spray Place 1 spray into both nostrils daily.     Marland Kitchen gabapentin (NEURONTIN) 300 MG capsule Take 300 mg by mouth 3 (three) times daily.    Marland Kitchen losartan  (COZAAR) 100 MG tablet Take 1 tablet (100 mg total) by mouth daily. 30 tablet 5  . omeprazole (PRILOSEC) 20 MG capsule Take 20 mg by mouth 2 (two) times daily.    . Probiotic Product (ALIGN PO) Take 1 capsule by mouth daily.     No current facility-administered medications for this visit.    Allergies:    Allergies  Allergen Reactions  . Aspirin Anaphylaxis, Hives and Swelling  . Whiskey [Alcohol] Anaphylaxis and Hives  . Amoxicillin Other (See Comments)    Causes sore throat  . Avelox [Moxifloxacin Hcl In Nacl] Other (See Comments)    Caused diarrhea & constipation  . Lisinopril Cough  . Penicillins Other (See Comments)    Unknown reaction  . Sulfa Antibiotics Hives  . Zithromax [Azithromycin] Other (See Comments)    Sore throat    Social History:  The patient  reports that he has never smoked. He has never used smokeless tobacco. He reports that he does not drink alcohol or use illicit drugs.   ROS:  Please see the history of present illness.   He was his diet very closely. His wife is concerned that some days he will need if his weight gets too high.   All other systems reviewed and negative.   OBJECTIVE: VS:  BP 160/68 mmHg  Pulse 68  Ht 5'  9" (1.753 m)  Wt 160 lb 12.8 oz (72.938 kg)  BMI 23.74 kg/m2 Well nourished, well developed, in no acute distress, elderly with difficulty ambulating HEENT: normal Neck: JVD flat lying at 30. Carotid bruit without bruits  Cardiac:  normal S1, S2; RRR; no murmur Lungs:  clear to auscultation bilaterally, no wheezing, rhonchi or rales Abd: soft, nontender, no hepatomegaly Ext: Edema none. Pulses 1-2+ bilateral Skin: warm and dry Neuro:  CNs 2-12 intact, no focal abnormalities noted  EKG:  Not repeated       Signed, Illene Labrador III, MD 04/27/2014 3:51 PM

## 2014-05-18 ENCOUNTER — Other Ambulatory Visit: Payer: Self-pay

## 2014-05-18 MED ORDER — CARVEDILOL 6.25 MG PO TABS: 6.2500 mg | ORAL_TABLET | Freq: Two times a day (BID) | ORAL | Status: DC

## 2014-06-14 DIAGNOSIS — L570 Actinic keratosis: Secondary | ICD-10-CM | POA: Diagnosis not present

## 2014-06-21 DIAGNOSIS — Z636 Dependent relative needing care at home: Secondary | ICD-10-CM | POA: Diagnosis not present

## 2014-06-21 DIAGNOSIS — N401 Enlarged prostate with lower urinary tract symptoms: Secondary | ICD-10-CM | POA: Diagnosis not present

## 2014-07-04 ENCOUNTER — Other Ambulatory Visit: Payer: Self-pay | Admitting: Cardiovascular Disease

## 2014-07-18 DIAGNOSIS — R509 Fever, unspecified: Secondary | ICD-10-CM | POA: Diagnosis not present

## 2014-07-21 ENCOUNTER — Emergency Department (HOSPITAL_BASED_OUTPATIENT_CLINIC_OR_DEPARTMENT_OTHER)
Admission: EM | Admit: 2014-07-21 | Discharge: 2014-07-22 | Disposition: A | Discharge status: Home / Self Care | Payer: Medicare Other | Attending: Emergency Medicine | Admitting: Emergency Medicine

## 2014-07-21 ENCOUNTER — Emergency Department (HOSPITAL_BASED_OUTPATIENT_CLINIC_OR_DEPARTMENT_OTHER): Payer: Medicare Other

## 2014-07-21 ENCOUNTER — Encounter (HOSPITAL_BASED_OUTPATIENT_CLINIC_OR_DEPARTMENT_OTHER): Payer: Self-pay | Admitting: *Deleted

## 2014-07-21 DIAGNOSIS — Z79899 Other long term (current) drug therapy: Secondary | ICD-10-CM | POA: Insufficient documentation

## 2014-07-21 DIAGNOSIS — I10 Essential (primary) hypertension: Secondary | ICD-10-CM | POA: Diagnosis not present

## 2014-07-21 DIAGNOSIS — E860 Dehydration: Secondary | ICD-10-CM | POA: Diagnosis not present

## 2014-07-21 DIAGNOSIS — R110 Nausea: Secondary | ICD-10-CM | POA: Diagnosis not present

## 2014-07-21 DIAGNOSIS — Z87438 Personal history of other diseases of male genital organs: Secondary | ICD-10-CM | POA: Diagnosis not present

## 2014-07-21 DIAGNOSIS — K219 Gastro-esophageal reflux disease without esophagitis: Secondary | ICD-10-CM | POA: Insufficient documentation

## 2014-07-21 DIAGNOSIS — Z7951 Long term (current) use of inhaled steroids: Secondary | ICD-10-CM | POA: Insufficient documentation

## 2014-07-21 DIAGNOSIS — J111 Influenza due to unidentified influenza virus with other respiratory manifestations: Secondary | ICD-10-CM | POA: Insufficient documentation

## 2014-07-21 DIAGNOSIS — R05 Cough: Secondary | ICD-10-CM | POA: Diagnosis present

## 2014-07-21 DIAGNOSIS — Z880 Allergy status to penicillin: Secondary | ICD-10-CM | POA: Diagnosis not present

## 2014-07-21 DIAGNOSIS — G629 Polyneuropathy, unspecified: Secondary | ICD-10-CM | POA: Insufficient documentation

## 2014-07-21 DIAGNOSIS — R059 Cough, unspecified: Secondary | ICD-10-CM

## 2014-07-21 DIAGNOSIS — J101 Influenza due to other identified influenza virus with other respiratory manifestations: Secondary | ICD-10-CM | POA: Diagnosis not present

## 2014-07-21 LAB — URINALYSIS, ROUTINE W REFLEX MICROSCOPIC
Bilirubin Urine: NEGATIVE
Glucose, UA: NEGATIVE mg/dL
Hgb urine dipstick: NEGATIVE
Ketones, ur: NEGATIVE mg/dL
LEUKOCYTES UA: NEGATIVE
NITRITE: NEGATIVE
PROTEIN: NEGATIVE mg/dL
Specific Gravity, Urine: 1.006 (ref 1.005–1.030)
UROBILINOGEN UA: 1 mg/dL (ref 0.0–1.0)
pH: 6.5 (ref 5.0–8.0)

## 2014-07-21 LAB — CBC WITH DIFFERENTIAL/PLATELET
BASOS ABS: 0 10*3/uL (ref 0.0–0.1)
Basophils Relative: 0 % (ref 0–1)
EOS ABS: 0.3 10*3/uL (ref 0.0–0.7)
EOS PCT: 7 % — AB (ref 0–5)
HEMATOCRIT: 40.8 % (ref 39.0–52.0)
HEMOGLOBIN: 13.8 g/dL (ref 13.0–17.0)
LYMPHS ABS: 1.8 10*3/uL (ref 0.7–4.0)
Lymphocytes Relative: 47 % — ABNORMAL HIGH (ref 12–46)
MCH: 32.9 pg (ref 26.0–34.0)
MCHC: 33.8 g/dL (ref 30.0–36.0)
MCV: 97.4 fL (ref 78.0–100.0)
MONO ABS: 0.5 10*3/uL (ref 0.1–1.0)
Monocytes Relative: 13 % — ABNORMAL HIGH (ref 3–12)
NEUTROS ABS: 1.3 10*3/uL — AB (ref 1.7–7.7)
Neutrophils Relative %: 33 % — ABNORMAL LOW (ref 43–77)
PLATELETS: 163 10*3/uL (ref 150–400)
RBC: 4.19 MIL/uL — ABNORMAL LOW (ref 4.22–5.81)
RDW: 12.6 % (ref 11.5–15.5)
WBC: 3.9 10*3/uL — ABNORMAL LOW (ref 4.0–10.5)

## 2014-07-21 LAB — COMPREHENSIVE METABOLIC PANEL
ALK PHOS: 71 U/L (ref 39–117)
ALT: 15 U/L (ref 0–53)
ANION GAP: 7 (ref 5–15)
AST: 26 U/L (ref 0–37)
Albumin: 3.6 g/dL (ref 3.5–5.2)
BUN: 22 mg/dL (ref 6–23)
CO2: 25 mmol/L (ref 19–32)
CREATININE: 1.17 mg/dL (ref 0.50–1.35)
Calcium: 8.4 mg/dL (ref 8.4–10.5)
Chloride: 107 mmol/L (ref 96–112)
GFR calc Af Amer: 63 mL/min — ABNORMAL LOW (ref >90)
GFR, EST NON AFRICAN AMERICAN: 55 mL/min — AB (ref >90)
GLUCOSE: 103 mg/dL — AB (ref 70–99)
POTASSIUM: 4.1 mmol/L (ref 3.5–5.1)
SODIUM: 139 mmol/L (ref 135–145)
Total Bilirubin: 0.7 mg/dL (ref 0.3–1.2)
Total Protein: 6.3 g/dL (ref 6.0–8.3)

## 2014-07-21 LAB — LIPASE, BLOOD: Lipase: 36 U/L (ref 11–59)

## 2014-07-21 MED ORDER — SODIUM CHLORIDE 0.9 % IV BOLUS (SEPSIS)
500.0000 mL | Freq: Once | INTRAVENOUS | Status: AC
  Administered 2014-07-21: 500 mL via INTRAVENOUS

## 2014-07-21 MED ORDER — ONDANSETRON 4 MG PO TBDP: 4.0000 mg | ORAL_TABLET | Freq: Three times a day (TID) | ORAL | Status: DC | PRN

## 2014-07-21 MED ORDER — ONDANSETRON HCL 4 MG/2ML IJ SOLN
4.0000 mg | Freq: Once | INTRAMUSCULAR | Status: AC
  Administered 2014-07-21: 4 mg via INTRAVENOUS
  Filled 2014-07-21: qty 2

## 2014-07-21 MED ORDER — SODIUM CHLORIDE 0.9 % IV SOLN
INTRAVENOUS | Status: DC
  Administered 2014-07-21: 22:00:00 via INTRAVENOUS

## 2014-07-21 NOTE — ED Provider Notes (Signed)
CSN: [id number]     Arrival date & time 07/21/14  1839 History  This chart was scribed for Fredia Sorrow, MD by Rayfield Citizen, ED Scribe. This patient was seen in room MH07/MH07 and the patient's care was started at 8:32 PM.   Level 5 Caveat; Confusion   Chief Complaint  Patient presents with  . Influenza   Patient is a 81 y.o. male presenting with flu symptoms. The history is provided by the patient. No language interpreter was used.  Influenza Presenting symptoms: cough, fever and nausea   Presenting symptoms: no diarrhea, no headaches, no myalgias, no sore throat and no vomiting   Severity:  Moderate Onset quality:  Gradual Duration:  5 days Progression:  Worsening Chronicity:  New Relieved by:  None tried Worsened by:  Nothing tried Ineffective treatments:  Prescription medications Associated symptoms: decreased appetite and decreased physical activity   Associated symptoms: no chills and no congestion   Risk factors: being elderly and sick contacts      HPI Comments: Frank Elliott is a 81 y.o. male with past medical history of HTN, pleural effusion, dyspnea who presents to the Emergency Department complaining of 5 days of flu-like symptoms. Patient explains that his symptoms began with "high fever," dry cough and weakness. He was seen by PCP 3 days PTA and diagnosed with flu; prescribed Tamiflu and hycodan. He states he has been compliant with this medication since. Family reports that patient's continues to complain of general malaise; his symptoms have "worsened" and he now complains of nausea, appetite change, and insomnia, in addition to his original symptoms. He denies vomiting, generalized myalgias, chest pain, headache. Patient is able to utilize his extremities normally; family denies unilateral weakness.   Family explains that patient's wife became ill with similar symptoms 6 days PTA; she has since improved and her symptoms have resolved without treatment.   PCP is Dr.  Freddrick March in [place]. He has had a flu shot this year.   Past Medical History  Diagnosis Date  . GERD (gastroesophageal reflux disease)   . Hypertension   . Neuropathy   . BPH (benign prostatic hyperplasia)   . Pleural effusion     dx 04/01/12  . Dyspnea    Past Surgical History  Procedure Laterality Date  . Joint replacement    . Appendectomy    . Cholecystectomy    . Tonsillectomy    . Back surgery     Family History  Problem Relation Age of Onset  . Heart disease Father    History  Substance Use Topics  . Smoking status: Never Smoker   . Smokeless tobacco: Never Used  . Alcohol Use: No    Review of Systems  Constitutional: Positive for fever and decreased appetite. Negative for chills.  HENT: Negative for congestion and sore throat.   Respiratory: Positive for cough.   Cardiovascular: Negative for chest pain.  Gastrointestinal: Positive for nausea. Negative for vomiting, abdominal pain and diarrhea.  Genitourinary: Negative for dysuria, frequency and hematuria.  Musculoskeletal: Negative for myalgias and back pain.  Skin: Negative for rash.  Neurological: Negative for headaches.  Hematological: Does not bruise/bleed easily.  Psychiatric/Behavioral: Negative for confusion.   Allergies  Aspirin; Whiskey; Amoxicillin; Avelox; Lisinopril; Penicillins; Sulfa antibiotics; and Zithromax  Home Medications   Prior to Admission medications   Medication Sig Start Date End Date Taking? Authorizing Provider  clotrimazole (LOTRIMIN) 1 % cream Apply 1 application topically 2 (two) times daily.   Yes Historical Provider,  MD  tamsulosin (FLOMAX) 0.4 MG CAPS capsule Take 0.4 mg by mouth.   Yes Historical Provider, MD  carvedilol (COREG) 6.25 MG tablet Take 1 tablet (6.25 mg total) by mouth 2 (two) times daily. 05/18/14   Belva Crome, MD  fluticasone (FLONASE) 50 MCG/ACT nasal spray Place 1 spray into both nostrils daily.  07/27/13   Historical Provider, MD  gabapentin (NEURONTIN)  300 MG capsule Take 300 mg by mouth 3 (three) times daily.    Historical Provider, MD  losartan (COZAAR) 100 MG tablet TAKE 1 TABLET BY MOUTH EVERY DAY 07/05/14   Belva Crome, MD  omeprazole (PRILOSEC) 20 MG capsule Take 20 mg by mouth 2 (two) times daily.    Historical Provider, MD  ondansetron (ZOFRAN ODT) 4 MG disintegrating tablet Take 1 tablet (4 mg total) by mouth every 8 (eight) hours as needed for nausea or vomiting. 07/21/14   Fredia Sorrow, MD  Probiotic Product (ALIGN PO) Take 1 capsule by mouth daily.    Historical Provider, MD   BP 164/71 mmHg  Pulse 65  Temp(Src) 98.1 F (36.7 C) (Oral)  Resp 18  Ht 5' 9.5" (1.765 m)  Wt 158 lb (71.668 kg)  BMI 23.01 kg/m2  SpO2 99% Physical Exam  Constitutional: He is oriented to person, place, and time. He appears well-developed and well-nourished.  HENT:  Head: Normocephalic and atraumatic.  Eyes: EOM are normal. Pupils are equal, round, and reactive to light. No scleral icterus.  Neck: No tracheal deviation present.  Cardiovascular: Normal rate, regular rhythm and normal heart sounds.   No murmur heard. Pulmonary/Chest: Effort normal and breath sounds normal. He has no wheezes. He has no rales.  Abdominal: Soft. Bowel sounds are normal. There is no tenderness.  Musculoskeletal: Normal range of motion. He exhibits no edema.  Neurological: He is alert and oriented to person, place, and time.  Skin: Skin is warm and dry.  Psychiatric: He has a normal mood and affect. His behavior is normal.  Nursing note and vitals reviewed.   ED Course  Procedures   DIAGNOSTIC STUDIES: Oxygen Saturation is 98% on RA, normal by my interpretation.    COORDINATION OF CARE: 8:53 PM Discussed treatment plan with pt at bedside and pt agreed to plan.   Labs Review Labs Reviewed  COMPREHENSIVE METABOLIC PANEL - Abnormal; Notable for the following:    Glucose, Bld 103 (*)    GFR calc non Af Amer 55 (*)    GFR calc Af Amer 63 (*)    All other  components within normal limits  CBC WITH DIFFERENTIAL/PLATELET - Abnormal; Notable for the following:    WBC 3.9 (*)    RBC 4.19 (*)    Neutrophils Relative % 33 (*)    Neutro Abs 1.3 (*)    Lymphocytes Relative 47 (*)    Monocytes Relative 13 (*)    Eosinophils Relative 7 (*)    All other components within normal limits  LIPASE, BLOOD  URINALYSIS, ROUTINE W REFLEX MICROSCOPIC   Results for orders placed or performed during the hospital encounter of 07/21/14  Comprehensive metabolic panel  Result Value Ref Range   Sodium 139 135 - 145 mmol/L   Potassium 4.1 3.5 - 5.1 mmol/L   Chloride 107 96 - 112 mmol/L   CO2 25 19 - 32 mmol/L   Glucose, Bld 103 (H) 70 - 99 mg/dL   BUN 22 6 - 23 mg/dL   Creatinine, Ser 1.17 0.50 - 1.35 mg/dL  Calcium 8.4 8.4 - 10.5 mg/dL   Total Protein 6.3 6.0 - 8.3 g/dL   Albumin 3.6 3.5 - 5.2 g/dL   AST 26 0 - 37 U/L   ALT 15 0 - 53 U/L   Alkaline Phosphatase 71 39 - 117 U/L   Total Bilirubin 0.7 0.3 - 1.2 mg/dL   GFR calc non Af Amer 55 (L) >90 mL/min   GFR calc Af Amer 63 (L) >90 mL/min   Anion gap 7 5 - 15  Lipase, blood  Result Value Ref Range   Lipase 36 11 - 59 U/L  CBC with Differential/Platelet  Result Value Ref Range   WBC 3.9 (L) 4.0 - 10.5 K/uL   RBC 4.19 (L) 4.22 - 5.81 MIL/uL   Hemoglobin 13.8 13.0 - 17.0 g/dL   HCT 40.8 39.0 - 52.0 %   MCV 97.4 78.0 - 100.0 fL   MCH 32.9 26.0 - 34.0 pg   MCHC 33.8 30.0 - 36.0 g/dL   RDW 12.6 11.5 - 15.5 %   Platelets 163 150 - 400 K/uL   Neutrophils Relative % 33 (L) 43 - 77 %   Neutro Abs 1.3 (L) 1.7 - 7.7 K/uL   Lymphocytes Relative 47 (H) 12 - 46 %   Lymphs Abs 1.8 0.7 - 4.0 K/uL   Monocytes Relative 13 (H) 3 - 12 %   Monocytes Absolute 0.5 0.1 - 1.0 K/uL   Eosinophils Relative 7 (H) 0 - 5 %   Eosinophils Absolute 0.3 0.0 - 0.7 K/uL   Basophils Relative 0 0 - 1 %   Basophils Absolute 0.0 0.0 - 0.1 K/uL  Urinalysis, Routine w reflex microscopic  Result Value Ref Range   Color, Urine  YELLOW YELLOW   APPearance CLEAR CLEAR   Specific Gravity, Urine 1.006 1.005 - 1.030   pH 6.5 5.0 - 8.0   Glucose, UA NEGATIVE NEGATIVE mg/dL   Hgb urine dipstick NEGATIVE NEGATIVE   Bilirubin Urine NEGATIVE NEGATIVE   Ketones, ur NEGATIVE NEGATIVE mg/dL   Protein, ur NEGATIVE NEGATIVE mg/dL   Urobilinogen, UA 1.0 0.0 - 1.0 mg/dL   Nitrite NEGATIVE NEGATIVE   Leukocytes, UA NEGATIVE NEGATIVE     Imaging Review Dg Chest 2 View  07/21/2014   CLINICAL DATA:  Flu-like symptoms for 1 week.  Cough.  EXAM: CHEST  2 VIEW  COMPARISON:  04/17/2012  FINDINGS: Normal heart size and pulmonary vascularity. Emphysematous changes in the lungs with central interstitial pattern suggesting chronic bronchitis. No focal airspace disease or consolidation in the lungs. No blunting of costophrenic angles. No pneumothorax. Degenerative changes in the spine and shoulders.  IMPRESSION: Emphysematous changes in the lungs with chronic bronchitic change. No evidence of active pulmonary disease.   Electronically Signed   By: Lucienne Capers M.D.   On: 07/21/2014 22:27     EKG Interpretation None      MDM   Final diagnoses:  Cough  Influenza  Dehydration   Patient with history of influenza confirmed by equal physicians in [facility]. Patient on Tamiflu. Patient stating that he's feeling very weak and fatigue. Nauseated not eating or drinking well. Workup here without evidence of pneumonia. No pulmonary edema. Labs without significant abnormalities. No evidence urinary tract infection no significant leukocytosis. No sniffing and anemia. No electrolyte abnormalities. Kidney function is normal. Would recommend further hydration. Patient received IV hydration here with improvement. Will treat with Zofran. Have him continue his Tamiflu. Follow-up with his doctor. Return for any new or  worse symptoms here.    I personally performed the services described in this documentation, which was scribed in my presence. The  recorded information has been reviewed and is accurate.       Fredia Sorrow, MD 07/22/14 0001

## 2014-07-21 NOTE — ED Notes (Signed)
pts son left to get some food. He will return.

## 2014-07-21 NOTE — ED Notes (Signed)
Pt c/o flu like symptoms x 1 week. He saw his PCP 3 days ago and was prescribed tamiflu and hycodan. Pt sts he has been taking the tamiflu but is feeling worse. He is unable to sleep or eat.

## 2014-07-22 NOTE — Discharge Instructions (Signed)
Take Zofran as needed for the nausea. Probably okay to continue your Tamiflu. Return for any new or worse symptoms. Today's workup without any significant findings. Lab work was normal. Chest x-ray negative for pneumonia or any fluid on your lungs. Would recommend increasing liquid intake. If you're not eating well would recommend liquids with sugar. Make appointment to follow-up with your doctor early part of next week. Return here for any new or worse symptoms.

## 2014-09-13 DIAGNOSIS — L570 Actinic keratosis: Secondary | ICD-10-CM | POA: Diagnosis not present

## 2014-09-13 DIAGNOSIS — L821 Other seborrheic keratosis: Secondary | ICD-10-CM | POA: Diagnosis not present

## 2014-09-13 DIAGNOSIS — D225 Melanocytic nevi of trunk: Secondary | ICD-10-CM | POA: Diagnosis not present

## 2014-09-13 DIAGNOSIS — L814 Other melanin hyperpigmentation: Secondary | ICD-10-CM | POA: Diagnosis not present

## 2014-09-14 ENCOUNTER — Other Ambulatory Visit: Payer: Self-pay

## 2014-09-14 MED ORDER — CARVEDILOL 6.25 MG PO TABS: 6.2500 mg | ORAL_TABLET | Freq: Two times a day (BID) | ORAL | Status: DC

## 2014-10-24 ENCOUNTER — Ambulatory Visit (INDEPENDENT_AMBULATORY_CARE_PROVIDER_SITE_OTHER): Payer: Medicare Other | Admitting: Interventional Cardiology

## 2014-10-24 ENCOUNTER — Encounter: Payer: Self-pay | Admitting: Interventional Cardiology

## 2014-10-24 VITALS — BP 150/72 | HR 60 | Ht 69.5 in | Wt 161.2 lb

## 2014-10-24 DIAGNOSIS — J410 Simple chronic bronchitis: Secondary | ICD-10-CM | POA: Diagnosis not present

## 2014-10-24 DIAGNOSIS — I10 Essential (primary) hypertension: Secondary | ICD-10-CM | POA: Diagnosis not present

## 2014-10-24 DIAGNOSIS — I5022 Chronic systolic (congestive) heart failure: Secondary | ICD-10-CM

## 2014-10-24 MED ORDER — LOSARTAN POTASSIUM 100 MG PO TABS: 100.0000 mg | ORAL_TABLET | Freq: Every day | ORAL | Status: DC

## 2014-10-24 NOTE — Progress Notes (Signed)
Cardiology Office Note   Date:  10/24/2014   ID:  Frank Elliott, DOB 12/01/1925, MRN [id number]  PCP:  No primary care provider on file.  Cardiologist:  Sinclair Grooms, MD   Chief Complaint  Patient presents with  . Follow-up    chronic systolic heart failure      History of Present Illness: Frank Elliott is a 81 y.o. male who presents for systolic heart failure, hypertension, and elderly and frail.  He complains his energy levels are now with a used to be. His wife has Parkinson's and he has to care for her needs. He still does R work. He denies orthopnea and PND. There are no episodes of chest pain or syncope. His current medical regimen has been stable without side effects.  Laboratory data performed in March 2016 revealed a potassium of 4.1 creatinine of 1.17    Past Medical History  Diagnosis Date  . GERD (gastroesophageal reflux disease)   . Hypertension   . Neuropathy   . BPH (benign prostatic hyperplasia)   . Pleural effusion     dx 04/01/12  . Dyspnea     Past Surgical History  Procedure Laterality Date  . Joint replacement    . Appendectomy    . Cholecystectomy    . Tonsillectomy    . Back surgery       Current Outpatient Prescriptions  Medication Sig Dispense Refill  . carvedilol (COREG) 6.25 MG tablet Take 1 tablet (6.25 mg total) by mouth 2 (two) times daily. 60 tablet 3  . clotrimazole (LOTRIMIN) 1 % cream Apply 1 application topically 2 (two) times daily.    . Cyanocobalamin (VITAMIN B-12 PO) Take 1 tablet by mouth daily.    . fluticasone (FLONASE) 50 MCG/ACT nasal spray Place 1 spray into both nostrils daily.     Marland Kitchen gabapentin (NEURONTIN) 300 MG capsule Take 300 mg by mouth 3 (three) times daily.    Marland Kitchen losartan (COZAAR) 100 MG tablet TAKE 1 TABLET BY MOUTH EVERY DAY 30 tablet 5  . omeprazole (PRILOSEC) 20 MG capsule Take 20 mg by mouth 2 (two) times daily.    . Probiotic Product (ALIGN PO) Take 1 capsule by mouth daily.    . tamsulosin  (FLOMAX) 0.4 MG CAPS capsule Take 0.4 mg by mouth.     No current facility-administered medications for this visit.    Allergies:   Aspirin; Whiskey; Amoxicillin; Avelox; Lisinopril; Penicillins; Sulfa antibiotics; and Zithromax    Social History:  The patient  reports that he has never smoked. He has never used smokeless tobacco. He reports that he does not drink alcohol or use illicit drugs.   Family History:  The patient's family history includes Heart disease in his father.    ROS:  Please see the history of present illness.   Otherwise, review of systems are positive for none.   All other systems are reviewed and negative.    PHYSICAL EXAM: VS:  BP 150/72 mmHg  Pulse 60  Ht 5' 9.5" (1.765 m)  Wt 73.12 kg (161 lb 3.2 oz)  BMI 23.47 kg/m2 , BMI Body mass index is 23.47 kg/(m^2). GEN: Well nourished, well developed, in no acute distress HEENT: normal Neck: no JVD, carotid bruits, or masses Cardiac: RRR; no murmurs, rubs, or gallops,no edema  Respiratory:  clear to auscultation bilaterally, normal work of breathing GI: soft, nontender, nondistended, + BS MS: no deformity or atrophy Skin: warm and dry, no rash Neuro:  Strength  and sensation are intact Psych: euthymic mood, full affect   EKG:  EKG is ordered today. The ekg ordered today demonstrates left ventricular hypertrophy, normal sinus rhythm, nonspecific ST-T wave abnormality.   Recent Labs: 07/21/2014: ALT 15; BUN 22; Creatinine, Ser 1.17; Hemoglobin 13.8; Platelets 163; Potassium 4.1; Sodium 139    Lipid Panel No results found for: CHOL, TRIG, HDL, CHOLHDL, VLDL, LDLCALC, LDLDIRECT    Wt Readings from Last 3 Encounters:  10/24/14 73.12 kg (161 lb 3.2 oz)  07/21/14 71.668 kg (158 lb)  04/27/14 72.938 kg (160 lb 12.8 oz)      Other studies Reviewed: Additional studies/ records that were reviewed today include:    ASSESSMENT AND PLAN:  Chronic systolic heart failure -no evidence of volume  overload  Essential hypertension- controlled  Left ventricular hypertrophy -      Current medicines are reviewed at length with the patient today.  The patient does not have concerns regarding medicines.  The following changes have been made:  no change  Labs/ tests ordered today include:  No orders of the defined types were placed in this encounter.     Disposition:   FU with HS in 1 year  Signed, Sinclair Grooms, MD  10/24/2014 3:36 PM    [facility] [place], [place], [place]  [address] Phone: [phone]; Fax: [phone]

## 2014-10-24 NOTE — Patient Instructions (Signed)
Medication Instructions:  Your physician recommends that you continue on your current medications as directed. Please refer to the Current Medication list given to you today.   Labwork: NONE  Testing/Procedures: NONE  Follow-Up: Your physician wants you to follow-up in: 12 months with Dr. [name]. You will receive a reminder letter in the mail two months in advance. If you don't receive a letter, please call our office to schedule the follow-up appointment.   Any Other Special Instructions Will Be Listed Below (If Applicable).

## 2014-10-25 DIAGNOSIS — M2022 Hallux rigidus, left foot: Secondary | ICD-10-CM | POA: Diagnosis not present

## 2014-12-30 DIAGNOSIS — F329 Major depressive disorder, single episode, unspecified: Secondary | ICD-10-CM | POA: Diagnosis not present

## 2014-12-30 DIAGNOSIS — I5022 Chronic systolic (congestive) heart failure: Secondary | ICD-10-CM | POA: Diagnosis not present

## 2014-12-30 DIAGNOSIS — J449 Chronic obstructive pulmonary disease, unspecified: Secondary | ICD-10-CM | POA: Diagnosis not present

## 2014-12-30 DIAGNOSIS — N183 Chronic kidney disease, stage 3 (moderate): Secondary | ICD-10-CM | POA: Diagnosis not present

## 2014-12-30 DIAGNOSIS — N400 Enlarged prostate without lower urinary tract symptoms: Secondary | ICD-10-CM | POA: Diagnosis not present

## 2014-12-30 DIAGNOSIS — I10 Essential (primary) hypertension: Secondary | ICD-10-CM | POA: Diagnosis not present

## 2015-01-04 ENCOUNTER — Encounter: Payer: Self-pay | Admitting: Interventional Cardiology

## 2015-01-19 ENCOUNTER — Other Ambulatory Visit: Payer: Self-pay

## 2015-01-19 MED ORDER — CARVEDILOL 6.25 MG PO TABS: 6.2500 mg | ORAL_TABLET | Freq: Two times a day (BID) | ORAL | Status: DC

## 2015-01-19 NOTE — Telephone Encounter (Signed)
Belva Crome, MD at 10/24/2014 3:36 PM  carvedilol (COREG) 6.25 MG tablet Take 1 tablet (6.25 mg total) by mouth 2 (two) times daily Current medicines are reviewed at length with the patient today. The patient does not have concerns regarding medicines.  The following changes have been made: no change

## 2015-03-01 DIAGNOSIS — Z23 Encounter for immunization: Secondary | ICD-10-CM | POA: Diagnosis not present

## 2015-03-17 DIAGNOSIS — L812 Freckles: Secondary | ICD-10-CM | POA: Diagnosis not present

## 2015-03-17 DIAGNOSIS — L570 Actinic keratosis: Secondary | ICD-10-CM | POA: Diagnosis not present

## 2015-03-17 DIAGNOSIS — L821 Other seborrheic keratosis: Secondary | ICD-10-CM | POA: Diagnosis not present

## 2015-03-17 DIAGNOSIS — D225 Melanocytic nevi of trunk: Secondary | ICD-10-CM | POA: Diagnosis not present

## 2015-03-29 ENCOUNTER — Telehealth: Payer: Self-pay | Admitting: Interventional Cardiology

## 2015-03-29 MED ORDER — FUROSEMIDE 20 MG PO TABS: 20.0000 mg | ORAL_TABLET | Freq: Every day | ORAL | Status: DC

## 2015-03-29 NOTE — Telephone Encounter (Signed)
Returned call to "Frank Elliott" at the tel# provided. lmtcb   Called pt home # and spoke with Frank Elliott pt CNA.Gilberto Better that the pt is asleep. The  pt has been swelling in his right foot and ankle for about a week. Pt does weigh regularly, but she was unable to provide weights. Pt denies sob. sts that the pt also has some puffy ness under his eyes. Pt is not complaining of any pain in his foot. No chest pain, palpitations, fever, chills. Adv her that Dr.Smith is not in the office. I will talk with another provider and call back with their recommendation.   Sarah aware of Sandria Senter recommendation start Lasix 20mg  daily for 3 days. Call the office on Mon 11/21 to give an update on pt swelling. Call sooner if symptoms worsen or pt is not tolerating med. Rx sent to pt pharmacy. Sarah verbalized understanding to the instruction given and will let pt and pt wife know. FYI fwd to Dr.Smith

## 2015-03-29 NOTE — Telephone Encounter (Signed)
F/u    Wife calling concerning previous message.

## 2015-03-29 NOTE — Telephone Encounter (Signed)
Pt c/o swelling: STAT is pt has developed SOB within 24 hours  1. How long have you been experiencing swelling?   2. Where is the swelling located? Right foot/ankle   3.  Are you currently taking a "fluid pill"? Wife stated she does not think so  4.  Are you currently SOB? no  5.  Have you traveled recently?no

## 2015-04-03 ENCOUNTER — Telehealth: Payer: Self-pay | Admitting: Interventional Cardiology

## 2015-04-03 DIAGNOSIS — M25471 Effusion, right ankle: Secondary | ICD-10-CM

## 2015-04-03 NOTE — Telephone Encounter (Signed)
Returned call to pt nursing aide Judson Roch. She sts that pt has not had any improvement since taking Lasix 20mg  qd x 3days. Pt still has swelling in his right foot and ankle. They are unable to report weights and vs. Pt does not have any other symptoms. No sob, chest pain, palpitations. Pt does not complain of pain in that foot, she sts that pt has not injured that foot. Ladona Horns that I will tale with a provider in the office and call back with their recommendation Sarah verbalized understanding.

## 2015-04-03 NOTE — Telephone Encounter (Signed)
Follow Up  Frank Elliott returned the call

## 2015-04-03 NOTE — Telephone Encounter (Signed)
Frank Elliott aware of Kathrene Alu recommendation. Per Tera Helper, NP(Flex) Pt should have a LE Venous Dopp to r/o dvt, if normal pt should f/u with his pcp. Ladona Horns that a scheduler from our office will call them to schedule. She verbalized understanding.

## 2015-04-03 NOTE — Telephone Encounter (Signed)
New message     Pt c/o swelling: STAT is pt has developed SOB within 24 hours  1. How long have you been experiencing swelling? Last week 2. Where is the swelling located?  Rt foot and rt ankle 3.  Are you currently taking a "fluid pill"? yes 4.  Are you currently SOB? No 5.  Have you traveled recently?no Rt foot and ankle still swollen after starting fluid pill last wed.  Please advise

## 2015-04-03 NOTE — Telephone Encounter (Signed)
Per Tera Helper, NP(Flex) Pt should have a LE Venous Dopp to r/o dvt, if normal pt should f/u with his pcp.  Called to give Frank Elliott.,NP recommendation. lmtcb

## 2015-04-05 ENCOUNTER — Ambulatory Visit (HOSPITAL_COMMUNITY)
Admission: RE | Admit: 2015-04-05 | Discharge: 2015-04-05 | Disposition: A | Discharge status: Home / Self Care | Payer: Medicare Other | Source: Ambulatory Visit | Attending: Interventional Cardiology | Admitting: Interventional Cardiology

## 2015-04-05 DIAGNOSIS — I10 Essential (primary) hypertension: Secondary | ICD-10-CM | POA: Insufficient documentation

## 2015-04-05 DIAGNOSIS — M25471 Effusion, right ankle: Secondary | ICD-10-CM | POA: Diagnosis not present

## 2015-04-10 NOTE — Telephone Encounter (Signed)
Called to give pt LE results. Pt request that I give his results to his caregiver Andee Poles. Daniell aware of results.No DVT noted Adv her Per Tera Helper, NP(Flex) Pt should have a LE Venous Dopp to r/o dvt, if normal pt should f/u with his pcp. She  Verbalized understanding and will let pt know

## 2015-04-13 DIAGNOSIS — H6120 Impacted cerumen, unspecified ear: Secondary | ICD-10-CM | POA: Diagnosis not present

## 2015-04-13 DIAGNOSIS — I5022 Chronic systolic (congestive) heart failure: Secondary | ICD-10-CM | POA: Diagnosis not present

## 2015-04-14 ENCOUNTER — Telehealth: Payer: Self-pay | Admitting: Interventional Cardiology

## 2015-04-14 NOTE — Telephone Encounter (Signed)
NewMEssage  Pt requesting to speak w/ RN- did not specify nature of call. Please call back and discuss.

## 2015-04-14 NOTE — Telephone Encounter (Signed)
LMTCB 04-14-15

## 2015-05-23 DIAGNOSIS — R32 Unspecified urinary incontinence: Secondary | ICD-10-CM | POA: Diagnosis not present

## 2015-05-23 DIAGNOSIS — J069 Acute upper respiratory infection, unspecified: Secondary | ICD-10-CM | POA: Diagnosis not present

## 2015-05-23 DIAGNOSIS — B372 Candidiasis of skin and nail: Secondary | ICD-10-CM | POA: Diagnosis not present

## 2015-05-23 DIAGNOSIS — N400 Enlarged prostate without lower urinary tract symptoms: Secondary | ICD-10-CM | POA: Diagnosis not present

## 2015-05-26 DIAGNOSIS — R05 Cough: Secondary | ICD-10-CM | POA: Diagnosis not present

## 2015-06-15 DIAGNOSIS — L570 Actinic keratosis: Secondary | ICD-10-CM | POA: Diagnosis not present

## 2015-07-11 DIAGNOSIS — M79672 Pain in left foot: Secondary | ICD-10-CM | POA: Diagnosis not present

## 2015-09-14 DIAGNOSIS — L820 Inflamed seborrheic keratosis: Secondary | ICD-10-CM | POA: Diagnosis not present

## 2015-09-14 DIAGNOSIS — L570 Actinic keratosis: Secondary | ICD-10-CM | POA: Diagnosis not present

## 2015-10-10 ENCOUNTER — Other Ambulatory Visit: Payer: Self-pay | Admitting: Interventional Cardiology

## 2015-10-13 ENCOUNTER — Other Ambulatory Visit: Payer: Self-pay | Admitting: *Deleted

## 2015-10-13 MED ORDER — CARVEDILOL 6.25 MG PO TABS: 6.2500 mg | ORAL_TABLET | Freq: Two times a day (BID) | ORAL | Status: DC

## 2015-12-15 DIAGNOSIS — L578 Other skin changes due to chronic exposure to nonionizing radiation: Secondary | ICD-10-CM | POA: Diagnosis not present

## 2015-12-15 DIAGNOSIS — L570 Actinic keratosis: Secondary | ICD-10-CM | POA: Diagnosis not present

## 2015-12-19 ENCOUNTER — Ambulatory Visit (INDEPENDENT_AMBULATORY_CARE_PROVIDER_SITE_OTHER): Payer: Medicare Other | Admitting: Interventional Cardiology

## 2015-12-19 ENCOUNTER — Encounter: Payer: Self-pay | Admitting: Interventional Cardiology

## 2015-12-19 VITALS — BP 146/72 | HR 52 | Ht 69.5 in | Wt 164.2 lb

## 2015-12-19 DIAGNOSIS — I10 Essential (primary) hypertension: Secondary | ICD-10-CM

## 2015-12-19 DIAGNOSIS — I5022 Chronic systolic (congestive) heart failure: Secondary | ICD-10-CM

## 2015-12-19 DIAGNOSIS — R600 Localized edema: Secondary | ICD-10-CM | POA: Insufficient documentation

## 2015-12-19 NOTE — Progress Notes (Signed)
Cardiology Office Note    Date:  12/19/2015   ID:  Frank Elliott, DOB 08-17-1925, MRN [id number]  PCP:  [name], MD  Cardiologist: Sinclair Grooms, MD   Chief Complaint  Patient presents with  . Congestive Heart Failure    History of Present Illness:  Frank Elliott is a 82 y.o. male who follows up for systolic heart failure converted to chronic diastolic heart failure, chronic right bundle branch block, hypertension, and pleural effusion.  Has bilateral lower extremity edema. Lasix was tried for this by PCP without improvement. Otherwise no complaints. He has not had syncope. He denies dyspnea. Appetite is been stable.  Past Medical History:  Diagnosis Date  . BPH (benign prostatic hyperplasia)   . Dyspnea   . GERD (gastroesophageal reflux disease)   . Hypertension   . Neuropathy ([place])   . Pleural effusion    dx 04/01/12    Past Surgical History:  Procedure Laterality Date  . APPENDECTOMY    . BACK SURGERY    . CHOLECYSTECTOMY    . JOINT REPLACEMENT    . TONSILLECTOMY      Current Medications: Outpatient Medications Prior to Visit  Medication Sig Dispense Refill  . carvedilol (COREG) 6.25 MG tablet Take 1 tablet (6.25 mg total) by mouth 2 (two) times daily. Please call and schedule a one year follow up appointment 180 tablet 0  . Cyanocobalamin (VITAMIN B-12 PO) Take 1 tablet by mouth daily.    . fluticasone (FLONASE) 50 MCG/ACT nasal spray Place 1 spray into both nostrils daily.     Marland Kitchen gabapentin (NEURONTIN) 300 MG capsule Take 300 mg by mouth 3 (three) times daily.    Marland Kitchen losartan (COZAAR) 100 MG tablet TAKE 1 TABLET BY MOUTH EVERY DAY 90 tablet 3  . omeprazole (PRILOSEC) 20 MG capsule Take 20 mg by mouth 2 (two) times daily.    . Probiotic Product (ALIGN PO) Take 1 capsule by mouth daily.    . clotrimazole (LOTRIMIN) 1 % cream Apply 1 application topically 2 (two) times daily.    . furosemide (LASIX) 20 MG tablet Take 1 tablet (20 mg total) by mouth  daily. (Patient not taking: Reported on 12/19/2015) 3 tablet 0  . losartan (COZAAR) 100 MG tablet Take 1 tablet (100 mg total) by mouth daily. Please call and schedule a one year follow up appointment (Patient not taking: Reported on 12/19/2015) 90 tablet 0  . tamsulosin (FLOMAX) 0.4 MG CAPS capsule Take 0.4 mg by mouth.     No facility-administered medications prior to visit.      Allergies:   Aspirin; Whiskey [alcohol]; Amoxicillin; Avelox [moxifloxacin hcl in nacl]; Lisinopril; Penicillins; Sulfa antibiotics; and Zithromax [azithromycin]   Social History   Social History  . Marital status: Married    Spouse name: N/A  . Number of children: N/A  . Years of education: N/A   Social History Main Topics  . Smoking status: Never Smoker  . Smokeless tobacco: Never Used  . Alcohol use No  . Drug use: No  . Sexual activity: Not Asked   Other Topics Concern  . None   Social History Narrative  . None     Family History:  The patient's family history includes Heart disease in his father.   ROS:   Please see the history of present illness.    Difficulty with ambulation due to weakness and balance disorder.  All other systems reviewed and are negative.   PHYSICAL EXAM:  VS:  BP (!) 146/72   Pulse (!) 52   Ht 5' 9.5" (1.765 m)   Wt 164 lb 3.2 oz (74.5 kg)   BMI 23.90 kg/m    GEN: Well nourished, well developed, in no acute distress  HEENT: normal  Neck: no JVD, carotid bruits, or masses Cardiac: RRR; no murmurs, rubs, or gallops. There is bilateral 2+ ankle to mid shin edema. Varicose veins are noted. There is cyanosis of the toes. Pulses are 2+ and symmetric at the dorsalis pedis bilaterally.  Respiratory:  clear to auscultation bilaterally, normal work of breathing GI: soft, nontender, nondistended, + BS MS: no deformity or atrophy  Skin: warm and dry, no rash Neuro:  Alert and Oriented x 3, Strength and sensation are intact Psych: euthymic mood, full affect  Wt Readings  from Last 3 Encounters:  12/19/15 164 lb 3.2 oz (74.5 kg)  10/24/14 161 lb 3.2 oz (73.1 kg)  07/21/14 158 lb (71.7 kg)      Studies/Labs Reviewed:   EKG:  EKG  Sinus bradycardia with, left anterior hemiblock, and when compared to prior tracings  Recent Labs: No results found for requested labs within last 8760 hours.   Lipid Panel No results found for: CHOL, TRIG, HDL, CHOLHDL, VLDL, LDLCALC, LDLDIRECT  Additional studies/ records that were reviewed today include:  Echocardiogram 11/02/13:  Study Conclusions  - Left ventricle: The cavity size was normal. There was mild focal basal hypertrophy of the septum. Systolic function was normal. The estimated ejection fraction was in the range of 50% to 55%. Wall motion was normal; there were no regional wall motion abnormalities. Doppler parameters are consistent with abnormal left ventricular relaxation (grade 1 diastolic dysfunction). - Left atrium: The atrium was mildly dilated.  ASSESSMENT:    1. Chronic systolic heart failure ([place])   2. Essential hypertension   3. Bilateral lower extremity edema      PLAN:  In order of problems listed above:  1. Lastly echocardiogram as noted above. EF is improved to greater than 50%. He now has chronic diastolic dysfunction without evidence of volume overload. We will continue medical regimen as listed. 2. Well controlled blood pressure on the current medical regimen 3. This is due to venous insufficiency and varicose veins. I've prescribed moderate tension knee-high support stockings and leg elevation.    Medication Adjustments/Labs and Tests Ordered: Current medicines are reviewed at length with the patient today.  Concerns regarding medicines are outlined above.  Medication changes, Labs and Tests ordered today are listed in the Patient Instructions below. There are no Patient Instructions on file for this visit.   Signed, Sinclair Grooms, MD  12/19/2015 3:09 PM    [facility] [place], [place], [place]  [address] Phone: [phone]; Fax: [phone]

## 2015-12-19 NOTE — Patient Instructions (Signed)
Medication Instructions:  Your physician recommends that you continue on your current medications as directed. Please refer to the Current Medication list given to you today.   Labwork: None ordered  Testing/Procedures: None ordered  Follow-Up: Your physician wants you to follow-up in: 1 year with Dr.Smith You will receive a reminder letter in the mail two months in advance. If you don't receive a letter, please call our office to schedule the follow-up appointment.   Any Other Special Instructions Will Be Listed Below (If Applicable). You have been given an Rx for compression stockings to be worn daily.  Elevate your legs as much as you can when sitting.     If you need a refill on your cardiac medications before your next appointment, please call your pharmacy.

## 2016-01-10 ENCOUNTER — Other Ambulatory Visit: Payer: Self-pay | Admitting: Interventional Cardiology

## 2016-01-12 DIAGNOSIS — Z23 Encounter for immunization: Secondary | ICD-10-CM | POA: Diagnosis not present

## 2016-01-26 DIAGNOSIS — L820 Inflamed seborrheic keratosis: Secondary | ICD-10-CM | POA: Diagnosis not present

## 2016-01-26 DIAGNOSIS — L570 Actinic keratosis: Secondary | ICD-10-CM | POA: Diagnosis not present

## 2016-01-26 DIAGNOSIS — L905 Scar conditions and fibrosis of skin: Secondary | ICD-10-CM | POA: Diagnosis not present

## 2016-05-04 DIAGNOSIS — H1089 Other conjunctivitis: Secondary | ICD-10-CM | POA: Diagnosis not present

## 2016-05-22 DIAGNOSIS — J069 Acute upper respiratory infection, unspecified: Secondary | ICD-10-CM | POA: Diagnosis not present

## 2016-05-22 DIAGNOSIS — R05 Cough: Secondary | ICD-10-CM | POA: Diagnosis not present

## 2016-06-02 ENCOUNTER — Emergency Department (HOSPITAL_COMMUNITY): Payer: Medicare Other

## 2016-06-02 ENCOUNTER — Encounter (HOSPITAL_COMMUNITY): Payer: Self-pay

## 2016-06-02 ENCOUNTER — Emergency Department (HOSPITAL_COMMUNITY)
Admission: EM | Admit: 2016-06-02 | Discharge: 2016-06-02 | Disposition: A | Discharge status: Home / Self Care | Payer: Medicare Other | Attending: Emergency Medicine | Admitting: Emergency Medicine

## 2016-06-02 DIAGNOSIS — J449 Chronic obstructive pulmonary disease, unspecified: Secondary | ICD-10-CM | POA: Insufficient documentation

## 2016-06-02 DIAGNOSIS — Z79899 Other long term (current) drug therapy: Secondary | ICD-10-CM | POA: Diagnosis not present

## 2016-06-02 DIAGNOSIS — I10 Essential (primary) hypertension: Secondary | ICD-10-CM | POA: Diagnosis not present

## 2016-06-02 DIAGNOSIS — R112 Nausea with vomiting, unspecified: Secondary | ICD-10-CM | POA: Insufficient documentation

## 2016-06-02 DIAGNOSIS — R935 Abnormal findings on diagnostic imaging of other abdominal regions, including retroperitoneum: Secondary | ICD-10-CM | POA: Insufficient documentation

## 2016-06-02 DIAGNOSIS — R402431 Glasgow coma scale score 3-8, in the field [EMT or ambulance]: Secondary | ICD-10-CM | POA: Diagnosis not present

## 2016-06-02 DIAGNOSIS — R001 Bradycardia, unspecified: Secondary | ICD-10-CM | POA: Diagnosis not present

## 2016-06-02 DIAGNOSIS — R404 Transient alteration of awareness: Secondary | ICD-10-CM | POA: Diagnosis not present

## 2016-06-02 DIAGNOSIS — N281 Cyst of kidney, acquired: Secondary | ICD-10-CM | POA: Diagnosis not present

## 2016-06-02 LAB — I-STAT CHEM 8, ED
BUN: 35 mg/dL — AB (ref 6–20)
CALCIUM ION: 1.13 mmol/L — AB (ref 1.15–1.40)
Chloride: 107 mmol/L (ref 101–111)
Creatinine, Ser: 1.3 mg/dL — ABNORMAL HIGH (ref 0.61–1.24)
Glucose, Bld: 119 mg/dL — ABNORMAL HIGH (ref 65–99)
HCT: 41 % (ref 39.0–52.0)
Hemoglobin: 13.9 g/dL (ref 13.0–17.0)
Potassium: 4.4 mmol/L (ref 3.5–5.1)
Sodium: 143 mmol/L (ref 135–145)
TCO2: 28 mmol/L (ref 0–100)

## 2016-06-02 LAB — COMPREHENSIVE METABOLIC PANEL
ALT: 10 U/L — AB (ref 17–63)
AST: 18 U/L (ref 15–41)
Albumin: 3.4 g/dL — ABNORMAL LOW (ref 3.5–5.0)
Alkaline Phosphatase: 86 U/L (ref 38–126)
Anion gap: 8 (ref 5–15)
BUN: 31 mg/dL — ABNORMAL HIGH (ref 6–20)
CHLORIDE: 107 mmol/L (ref 101–111)
CO2: 26 mmol/L (ref 22–32)
CREATININE: 1.29 mg/dL — AB (ref 0.61–1.24)
Calcium: 8.8 mg/dL — ABNORMAL LOW (ref 8.9–10.3)
GFR calc Af Amer: 55 mL/min — ABNORMAL LOW (ref >60)
GFR calc non Af Amer: 48 mL/min — ABNORMAL LOW (ref >60)
GLUCOSE: 123 mg/dL — AB (ref 65–99)
POTASSIUM: 4.3 mmol/L (ref 3.5–5.1)
Sodium: 141 mmol/L (ref 135–145)
Total Bilirubin: 0.6 mg/dL (ref 0.3–1.2)
Total Protein: 6.2 g/dL — ABNORMAL LOW (ref 6.5–8.1)

## 2016-06-02 LAB — CBC WITH DIFFERENTIAL/PLATELET
BASOS ABS: 0 10*3/uL (ref 0.0–0.1)
BASOS PCT: 0 %
EOS PCT: 8 %
Eosinophils Absolute: 0.6 10*3/uL (ref 0.0–0.7)
HCT: 43.6 % (ref 39.0–52.0)
Hemoglobin: 14.9 g/dL (ref 13.0–17.0)
Lymphocytes Relative: 9 %
Lymphs Abs: 0.7 10*3/uL (ref 0.7–4.0)
MCH: 33.3 pg (ref 26.0–34.0)
MCHC: 34.2 g/dL (ref 30.0–36.0)
MCV: 97.3 fL (ref 78.0–100.0)
Monocytes Absolute: 0.5 10*3/uL (ref 0.1–1.0)
Monocytes Relative: 7 %
Neutro Abs: 5.7 10*3/uL (ref 1.7–7.7)
Neutrophils Relative %: 76 %
PLATELETS: 256 10*3/uL (ref 150–400)
RBC: 4.48 MIL/uL (ref 4.22–5.81)
RDW: 12.9 % (ref 11.5–15.5)
WBC: 7.5 10*3/uL (ref 4.0–10.5)

## 2016-06-02 LAB — URINALYSIS, ROUTINE W REFLEX MICROSCOPIC
Bilirubin Urine: NEGATIVE
Glucose, UA: NEGATIVE mg/dL
HGB URINE DIPSTICK: NEGATIVE
Ketones, ur: NEGATIVE mg/dL
Leukocytes, UA: NEGATIVE
Nitrite: NEGATIVE
PH: 5 (ref 5.0–8.0)
Protein, ur: NEGATIVE mg/dL
SPECIFIC GRAVITY, URINE: 1.026 (ref 1.005–1.030)

## 2016-06-02 LAB — LIPASE, BLOOD: Lipase: 32 U/L (ref 11–51)

## 2016-06-02 MED ORDER — PROMETHAZINE HCL 25 MG/ML IJ SOLN
25.0000 mg | Freq: Once | INTRAMUSCULAR | Status: AC
  Administered 2016-06-02: 25 mg via INTRAMUSCULAR
  Filled 2016-06-02: qty 1

## 2016-06-02 MED ORDER — SODIUM CHLORIDE 0.9 % IV BOLUS (SEPSIS)
1000.0000 mL | Freq: Once | INTRAVENOUS | Status: AC
  Administered 2016-06-02: 1000 mL via INTRAVENOUS

## 2016-06-02 MED ORDER — PROMETHAZINE HCL 25 MG PO TABS: 25.0000 mg | ORAL_TABLET | Freq: Three times a day (TID) | ORAL | 0 refills | Status: DC | PRN

## 2016-06-02 MED ORDER — IOPAMIDOL (ISOVUE-300) INJECTION 61%
INTRAVENOUS | Status: AC
  Administered 2016-06-02: 100 mL
  Filled 2016-06-02: qty 100

## 2016-06-02 MED ORDER — ONDANSETRON HCL 4 MG/2ML IJ SOLN
4.0000 mg | Freq: Once | INTRAMUSCULAR | Status: AC
  Administered 2016-06-02: 4 mg via INTRAVENOUS
  Filled 2016-06-02: qty 2

## 2016-06-02 NOTE — ED Notes (Signed)
Helping pt get ready to go. Pt waiting on his son to arrive to pick him up.

## 2016-06-02 NOTE — ED Provider Notes (Signed)
[facility] DEPT Provider Note   CSN: [id number] Arrival date & time: 06/02/16  [id number]   By signing my name below, I, Frank Elliott, attest that this documentation has been prepared under the direction and in the presence of Frank Balls, MD. Electronically signed, Frank Elliott, ED Scribe. 06/02/16. 3:36 AM.   History   Chief Complaint Chief Complaint  Patient presents with  . Nausea  . Emesis   HPI  HPI Comments: Frank Elliott is a 83 y.o. male who presents to the Emergency Department complaining of N/V x 4-5 hours. Nurse reports pt vomited en route with EMS. Pt notes Hx of similar symptoms, normally relieved with fenergen and abx. Pt denies abdominal pain and diarrhea.    Past Medical History:  Diagnosis Date  . BPH (benign prostatic hyperplasia)   . Dyspnea   . GERD (gastroesophageal reflux disease)   . Hypertension   . Neuropathy ([place])   . Pleural effusion    dx 04/01/12    Patient Active Problem List   Diagnosis Date Noted  . Bilateral lower extremity edema 12/19/2015  . Chronic systolic heart failure ([place]) 04/25/2013  . HTN (hypertension) 04/20/2012  . COPD (chronic obstructive pulmonary disease) ([place]) 04/18/2012    Past Surgical History:  Procedure Laterality Date  . APPENDECTOMY    . BACK SURGERY    . CHOLECYSTECTOMY    . JOINT REPLACEMENT    . TONSILLECTOMY         Home Medications    Prior to Admission medications   Medication Sig Start Date End Date Taking? Authorizing Provider  carvedilol (COREG) 6.25 MG tablet TAKE 1 TABLET BY MOUTH TWICE A DAY 01/10/16  Yes Belva Crome, MD  Cyanocobalamin (VITAMIN B-12 PO) Take 1,000 mcg by mouth daily.    Yes Historical Provider, MD  finasteride (PROSCAR) 5 MG tablet Take 5 mg by mouth daily.   Yes Historical Provider, MD  fluticasone (FLONASE) 50 MCG/ACT nasal spray Place 1 spray into both nostrils daily as needed for allergies.  07/27/13  Yes Historical Provider, MD  gabapentin (NEURONTIN) 300 MG  capsule Take 300 mg by mouth 3 (three) times daily.   Yes Historical Provider, MD  losartan (COZAAR) 100 MG tablet TAKE 1 TABLET BY MOUTH EVERY DAY 10/10/15  Yes Belva Crome, MD  omeprazole (PRILOSEC) 20 MG capsule Take 20 mg by mouth 2 (two) times daily.   Yes Historical Provider, MD  Probiotic Product (ALIGN PO) Take 1 capsule by mouth daily.   Yes Historical Provider, MD  promethazine (PHENERGAN) 25 MG tablet Take 1 tablet (25 mg total) by mouth every 8 (eight) hours as needed for nausea or vomiting. 06/02/16   Frank Balls, MD    Family History Family History  Problem Relation Age of Onset  . Heart disease Father     Social History Social History  Substance Use Topics  . Smoking status: Never Smoker  . Smokeless tobacco: Never Used  . Alcohol use No     Allergies   Aspirin; Whiskey [alcohol]; Amoxicillin; Avelox [moxifloxacin hcl in nacl]; Lisinopril; Penicillins; Sulfa antibiotics; and Zithromax [azithromycin]   Review of Systems Review of Systems A complete 10 system review of systems was obtained and all systems are negative except as noted in the HPI and PMH.    Physical Exam Updated Vital Signs BP 188/76   Pulse (!) 43   Temp 98.5 F (36.9 C) (Oral)   Resp 20   Ht 5\' 9"  (1.753 m)  Wt 165 lb (74.8 kg)   SpO2 97%   BMI 24.37 kg/m   Physical Exam  Constitutional: He is oriented to person, place, and time. Vital signs are normal. He appears well-developed and well-nourished.  Non-toxic appearance. He does not appear ill. No distress.  HENT:  Head: Normocephalic and atraumatic.  Nose: Nose normal.  Mouth/Throat: Oropharynx is clear and moist. No oropharyngeal exudate.  Eyes: Conjunctivae and EOM are normal. Pupils are equal, round, and reactive to light. No scleral icterus.  Neck: Normal range of motion. Neck supple. No tracheal deviation, no edema, no erythema and normal range of motion present. No thyroid mass and no thyromegaly present.  Cardiovascular:  Regular rhythm, S1 normal, S2 normal, normal heart sounds, intact distal pulses and normal pulses.  Bradycardia present.  Exam reveals no gallop and no friction rub.   No murmur heard. Trace bilateral LE edema.  Pulmonary/Chest: Effort normal and breath sounds normal. No respiratory distress. He has no wheezes. He has no rhonchi. He has no rales.  Abdominal: Soft. Normal appearance and bowel sounds are normal. He exhibits no distension, no ascites and no mass. There is no hepatosplenomegaly. There is no tenderness. There is no rebound, no guarding and no CVA tenderness.  Musculoskeletal: Normal range of motion. He exhibits no edema or tenderness.  Lymphadenopathy:    He has no cervical adenopathy.  Neurological: He is alert and oriented to person, place, and time. He has normal strength. No cranial nerve deficit or sensory deficit.  Skin: Skin is warm, dry and intact. No petechiae and no rash noted. He is not diaphoretic. No erythema. No pallor.  Nursing note and vitals reviewed.    ED Treatments / Results  DIAGNOSTIC STUDIES: Oxygen Saturation is 98% on RA, normal by my interpretation.    COORDINATION OF CARE: 3:36 AM Discussed treatment plan with pt at bedside and pt agreed to plan.  Labs (all labs ordered are listed, but only abnormal results are displayed) Labs Reviewed  COMPREHENSIVE METABOLIC PANEL - Abnormal; Notable for the following:       Result Value   Glucose, Bld 123 (*)    BUN 31 (*)    Creatinine, Ser 1.29 (*)    Calcium 8.8 (*)    Total Protein 6.2 (*)    Albumin 3.4 (*)    ALT 10 (*)    GFR calc non Af Amer 48 (*)    GFR calc Af Amer 55 (*)    All other components within normal limits  I-STAT CHEM 8, ED - Abnormal; Notable for the following:    BUN 35 (*)    Creatinine, Ser 1.30 (*)    Glucose, Bld 119 (*)    Calcium, Ion 1.13 (*)    All other components within normal limits  CBC WITH DIFFERENTIAL/PLATELET  LIPASE, BLOOD  URINALYSIS, ROUTINE W REFLEX  MICROSCOPIC    EKG  EKG Interpretation None       Radiology Ct Abdomen Pelvis W Contrast  Result Date: 06/02/2016 CLINICAL DATA:  Nausea and vomiting since last night.  No pain. EXAM: CT ABDOMEN AND PELVIS WITH CONTRAST TECHNIQUE: Multidetector CT imaging of the abdomen and pelvis was performed using the standard protocol following bolus administration of intravenous contrast. CONTRAST:  146mL ISOVUE-300 IOPAMIDOL (ISOVUE-300) INJECTION 61% COMPARISON:  November 17, 2011 FINDINGS: Lower chest: No acute abnormality. Hepatobiliary: Previous cholecystectomy. Portal veins and liver are otherwise normal. Pancreas: Unremarkable. No pancreatic ductal dilatation or surrounding inflammatory changes. Spleen: Normal in size without  focal abnormality. Adrenals/Urinary Tract: The adrenal glands are normal. Bilateral renal cysts and peripelvic cysts are identified with no perinephric stranding or definitive hydronephrosis. Ureters are normal in caliber with no ureteral stones. The bladder is also normal. Stomach/Bowel: There is a small hiatal hernia. The stomach is otherwise normal. There is a duodenal diverticulum. The small bowel is otherwise normal. Colonic diverticulosis is seen in the sigmoid colon with no diverticulitis. The appendix is not seen but there is no secondary evidence of appendicitis. Vascular/Lymphatic: Atherosclerotic changes seen in the non aneurysmal aorta and iliac vessels. No adenopathy. Reproductive: Prostate is unremarkable. Other: There is a fat containing right inguinal hernia. Musculoskeletal: A compression fracture of L4 is unchanged in nonacute. Degenerative changes. No other bony abnormalities. IMPRESSION: 1. Bilateral renal cysts and peripelvic cysts with no hydronephrosis, renal stones, or ureteral stones. 2. No cause for nausea and vomiting identified. The appendix is not seen but there is no secondary evidence of appendicitis. 3. Atherosclerotic change in the non aneurysmal aorta and  iliac vessels. 4. Fat containing right inguinal hernia. 5. Chronic compression fracture of L4, unchanged since 2013. Electronically Signed   By: Dorise Bullion III M.D   On: 06/02/2016 07:42    Procedures Procedures (including critical care time)  Medications Ordered in ED Medications  promethazine (PHENERGAN) injection 25 mg (not administered)  sodium chloride 0.9 % bolus 1,000 mL (0 mLs Intravenous Stopped 06/02/16 0700)  ondansetron (ZOFRAN) injection 4 mg (4 mg Intravenous Given 06/02/16 0339)  iopamidol (ISOVUE-300) 61 % injection (100 mLs  Contrast Given 06/02/16 [id number])     Initial Impression / Assessment and Plan / ED Course  I have reviewed the triage vital signs and the nursing notes.  Pertinent labs & imaging results that were available during my care of the patient were reviewed by me and considered in my medical decision making (see chart for details).    Patient presents to the ED for nausea and vomiting. He was given zofran but this did not work.  redosed with IM phenergan.  Will obtain CT to ensure there is no intraabd process.  Labs are normal.   CT is negative  DC with phenergan to take at home as needed.  He appears well and in NAD.  No vomiting in the ED seen.  VS remain within his normal limits and he is safe for DC.   I personally performed the services described in this documentation, which was scribed in my presence. The recorded information has been reviewed and is accurate.     Final Clinical Impressions(s) / ED Diagnoses   Final diagnoses:  Non-intractable vomiting with nausea, unspecified vomiting type    New Prescriptions New Prescriptions   PROMETHAZINE (PHENERGAN) 25 MG TABLET    Take 1 tablet (25 mg total) by mouth every 8 (eight) hours as needed for nausea or vomiting.     Frank Balls, MD 06/02/16 [phone]

## 2016-06-02 NOTE — ED Triage Notes (Signed)
Patient here by EMS for nausea and vomiting that started at 11pm 06-01-16.  No complains other than vomiting and feeling nauseated.  A&Ox4. Hx of CHF

## 2016-06-02 NOTE — ED Notes (Signed)
Patient taken to CT.

## 2016-06-10 DIAGNOSIS — H10503 Unspecified blepharoconjunctivitis, bilateral: Secondary | ICD-10-CM | POA: Diagnosis not present

## 2016-07-25 DIAGNOSIS — H5213 Myopia, bilateral: Secondary | ICD-10-CM | POA: Diagnosis not present

## 2016-07-25 DIAGNOSIS — H353131 Nonexudative age-related macular degeneration, bilateral, early dry stage: Secondary | ICD-10-CM | POA: Diagnosis not present

## 2016-07-25 DIAGNOSIS — H52203 Unspecified astigmatism, bilateral: Secondary | ICD-10-CM | POA: Diagnosis not present

## 2016-07-25 DIAGNOSIS — H524 Presbyopia: Secondary | ICD-10-CM | POA: Diagnosis not present

## 2016-07-25 DIAGNOSIS — Z961 Presence of intraocular lens: Secondary | ICD-10-CM | POA: Diagnosis not present

## 2016-07-26 DIAGNOSIS — L570 Actinic keratosis: Secondary | ICD-10-CM | POA: Diagnosis not present

## 2016-07-26 DIAGNOSIS — L309 Dermatitis, unspecified: Secondary | ICD-10-CM | POA: Diagnosis not present

## 2016-07-26 DIAGNOSIS — L820 Inflamed seborrheic keratosis: Secondary | ICD-10-CM | POA: Diagnosis not present

## 2016-07-26 DIAGNOSIS — D225 Melanocytic nevi of trunk: Secondary | ICD-10-CM | POA: Diagnosis not present

## 2016-07-26 DIAGNOSIS — L821 Other seborrheic keratosis: Secondary | ICD-10-CM | POA: Diagnosis not present

## 2016-12-05 DIAGNOSIS — R3912 Poor urinary stream: Secondary | ICD-10-CM | POA: Diagnosis not present

## 2016-12-05 DIAGNOSIS — K648 Other hemorrhoids: Secondary | ICD-10-CM | POA: Diagnosis not present

## 2016-12-05 DIAGNOSIS — I10 Essential (primary) hypertension: Secondary | ICD-10-CM | POA: Diagnosis not present

## 2016-12-05 DIAGNOSIS — N401 Enlarged prostate with lower urinary tract symptoms: Secondary | ICD-10-CM | POA: Diagnosis not present

## 2016-12-05 DIAGNOSIS — N183 Chronic kidney disease, stage 3 (moderate): Secondary | ICD-10-CM | POA: Diagnosis not present

## 2016-12-05 DIAGNOSIS — K602 Anal fissure, unspecified: Secondary | ICD-10-CM | POA: Diagnosis not present

## 2016-12-05 DIAGNOSIS — K625 Hemorrhage of anus and rectum: Secondary | ICD-10-CM | POA: Diagnosis not present

## 2016-12-19 ENCOUNTER — Ambulatory Visit (INDEPENDENT_AMBULATORY_CARE_PROVIDER_SITE_OTHER): Payer: Medicare Other | Admitting: Interventional Cardiology

## 2016-12-19 ENCOUNTER — Encounter (INDEPENDENT_AMBULATORY_CARE_PROVIDER_SITE_OTHER): Payer: Self-pay

## 2016-12-19 ENCOUNTER — Encounter: Payer: Self-pay | Admitting: Interventional Cardiology

## 2016-12-19 VITALS — BP 136/78 | HR 64 | Ht 69.0 in | Wt 164.0 lb

## 2016-12-19 DIAGNOSIS — F4321 Adjustment disorder with depressed mood: Secondary | ICD-10-CM

## 2016-12-19 DIAGNOSIS — J410 Simple chronic bronchitis: Secondary | ICD-10-CM | POA: Diagnosis not present

## 2016-12-19 DIAGNOSIS — I5022 Chronic systolic (congestive) heart failure: Secondary | ICD-10-CM

## 2016-12-19 DIAGNOSIS — R600 Localized edema: Secondary | ICD-10-CM | POA: Diagnosis not present

## 2016-12-19 DIAGNOSIS — I10 Essential (primary) hypertension: Secondary | ICD-10-CM

## 2016-12-19 NOTE — Progress Notes (Signed)
Cardiology Office Note    Date:  12/19/2016   ID:  EDIS HUISH, DOB [birth year], MRN [id number]  PCP:  [name], MD  Cardiologist: Sinclair Grooms, MD   Chief Complaint  Patient presents with  . Congestive Heart Failure    History of Present Illness:  Frank Elliott is a 83 y.o. male who follows up for systolic heart failure converted to chronic diastolic heart failure, chronic right bundle branch block with left anterior hemiblock-bifascicular block, hypertension, and pleural effusion.  Finch has lost his wife since the last visit. He voices decreased energy. He has not had swelling or shortness of breath. No episodes of syncope. Appetite is been adequate. He lives independently. He eats out for every meal. He does not try to cut. He has not had falls or difficulty with balance.Has persistent lower extremity edema but it is not different than previous.  Past Medical History:  Diagnosis Date  . BPH (benign prostatic hyperplasia)   . Dyspnea   . GERD (gastroesophageal reflux disease)   . Hypertension   . Neuropathy   . Pleural effusion    dx 04/01/12    Past Surgical History:  Procedure Laterality Date  . APPENDECTOMY    . BACK SURGERY    . CHOLECYSTECTOMY    . JOINT REPLACEMENT    . TONSILLECTOMY      Current Medications: Outpatient Medications Prior to Visit  Medication Sig Dispense Refill  . carvedilol (COREG) 6.25 MG tablet TAKE 1 TABLET BY MOUTH TWICE A DAY 180 tablet 3  . Cyanocobalamin (VITAMIN B-12 PO) Take 1,000 mcg by mouth daily.     . fluticasone (FLONASE) 50 MCG/ACT nasal spray Place 1 spray into both nostrils daily as needed for allergies.     Marland Kitchen gabapentin (NEURONTIN) 300 MG capsule Take 300 mg by mouth 3 (three) times daily.    Marland Kitchen losartan (COZAAR) 100 MG tablet TAKE 1 TABLET BY MOUTH EVERY DAY 90 tablet 3  . omeprazole (PRILOSEC) 20 MG capsule Take 20 mg by mouth 2 (two) times daily.    . Probiotic Product (ALIGN PO) Take 1 capsule by mouth  daily.    . promethazine (PHENERGAN) 25 MG tablet Take 1 tablet (25 mg total) by mouth every 8 (eight) hours as needed for nausea or vomiting. 12 tablet 0  . finasteride (PROSCAR) 5 MG tablet Take 5 mg by mouth daily.     No facility-administered medications prior to visit.      Allergies:   Aspirin; Whiskey [alcohol]; Amoxicillin; Avelox [moxifloxacin hcl in nacl]; Lisinopril; Penicillins; Sulfa antibiotics; Tape; and Zithromax [azithromycin]   Social History   Social History  . Marital status: Widowed    Spouse name: N/A  . Number of children: N/A  . Years of education: N/A   Social History Main Topics  . Smoking status: Never Smoker  . Smokeless tobacco: Never Used  . Alcohol use No  . Drug use: No  . Sexual activity: Not Asked   Other Topics Concern  . None   Social History Narrative  . None     Family History:  The patient's family history includes Heart disease in his father.   ROS:   Please see the history of present illness.    His wife died since our last office visit. He has been depressed. He has weakness and fatigue. Some shortness of breath, hearing loss, difficulty with balance, nausea, and some constipation. Also complains of excessive fatigue.  All other  systems reviewed and are negative.   PHYSICAL EXAM:   VS:  BP 136/78 (BP Location: Left Arm)   Pulse 64   Ht 5\' 9"  (1.753 m)   Wt 164 lb (74.4 kg)   BMI 24.22 kg/m    GEN: Well nourished, well developed, in no acute distress . Frail appearing. HEENT: normal  Neck: no JVD, carotid bruits, or masses Cardiac: RRR; no murmurs, rubs, or gallops. Bilateral ankle edema. Respiratory:  clear to auscultation bilaterally, normal work of breathing GI: soft, nontender, nondistended, + BS MS: no deformity or atrophy  Skin: warm and dry, no rash Neuro:  Alert and Oriented x 3, Strength and sensation are intact Psych: euthymic mood, full affect  Wt Readings from Last 3 Encounters:  12/19/16 164 lb (74.4 kg)    06/02/16 165 lb (74.8 kg)  12/19/15 164 lb 3.2 oz (74.5 kg)      Studies/Labs Reviewed:   EKG:  EKG  Sinus rhythm, right bundle branch block, left axis deviation, first-degree AV block. When compared to the prior tracing, heart rate is faster, and occasional PVCs are noted.  Recent Labs: 06/02/2016: ALT 10; BUN 35; Creatinine, Ser 1.30; Hemoglobin 13.9; Platelets 256; Potassium 4.4; Sodium 143   Lipid Panel No results found for: CHOL, TRIG, HDL, CHOLHDL, VLDL, LDLCALC, LDLDIRECT  Additional studies/ records that were reviewed today include:  Echocardiogram 11/02/13: Study Conclusions  - Left ventricle: The cavity size was normal. There was mild focal basal hypertrophy of the septum. Systolic function was normal. The estimated ejection fraction was in the range of 50% to 55%. Wall motion was normal; there were no regional wall motion abnormalities. Doppler parameters are consistent with abnormal left ventricular relaxation (grade 1 diastolic dysfunction). - Left atrium: The atrium was mildly dilated.   Recent laboratory data by Dr. Olen Pel was reviewed. Potassium, kidney function, and hemoglobin were all normal.  ASSESSMENT:    1. Chronic systolic heart failure ([place])   2. Essential hypertension   3. Simple chronic bronchitis ([place])   4. Bilateral lower extremity edema   5. Situational depression      PLAN:  In order of problems listed above:  1. I believe he still has chronic diastolic heart failure. Is no clinical evidence of volume overload. Will do a 2D- Doppler echocardiogram to reassess LV function given his complaint of fatigue. 2. Blood pressure is under excellent control. Target 140/90 mmHg or less. 3. Not addressed. 4. Persistent bilateral edema in the legs a combination of venous insufficiency and heart disease. Stable without any evidence of skin breakdown. 5. This problem was recognized and discussed with the patient. It is related to the death of his  wife of over 46 years. Encouragement was given. The normality of this situation was recognized in our conversation. If he continues to have difficulty with sleeping hard to feel sad, he may need medication therapy.  2-D Doppler echocardiogram to reassess LV function. None done since 2015. Continue medication as listed.  Greater than 50% of the time during this visit was spent in counseling and dealing with grief related to the recent loss of the patient's wife. I believe much of his current complaints are related to depression.  Medication Adjustments/Labs and Tests Ordered: Current medicines are reviewed at length with the patient today.  Concerns regarding medicines are outlined above.  Medication changes, Labs and Tests ordered today are listed in the Patient Instructions below. Patient Instructions  Medication Instructions:  None  Labwork: None  Testing/Procedures: Your  physician has requested that you have an echocardiogram. Echocardiography is a painless test that uses sound waves to create images of your heart. It provides your doctor with information about the size and shape of your heart and how well your heart's chambers and valves are working. This procedure takes approximately one hour. There are no restrictions for this procedure.   Follow-Up: Your physician wants you to follow-up in: 1 year with Dr. [name].  You will receive a reminder letter in the mail two months in advance. If you don't receive a letter, please call our office to schedule the follow-up appointment.   Any Other Special Instructions Will Be Listed Below (If Applicable).     If you need a refill on your cardiac medications before your next appointment, please call your pharmacy.      Signed, Sinclair Grooms, MD  12/19/2016 10:19 AM    [facility] [address], [place],   [address] Phone: [phone]; Fax: [phone]

## 2016-12-19 NOTE — Patient Instructions (Signed)
Medication Instructions:  None  Labwork: None  Testing/Procedures: Your physician has requested that you have an echocardiogram. Echocardiography is a painless test that uses sound waves to create images of your heart. It provides your doctor with information about the size and shape of your heart and how well your heart's chambers and valves are working. This procedure takes approximately one hour. There are no restrictions for this procedure.   Follow-Up: Your physician wants you to follow-up in: 1 year with Dr. Smith.  You will receive a reminder letter in the mail two months in advance. If you don't receive a letter, please call our office to schedule the follow-up appointment.   Any Other Special Instructions Will Be Listed Below (If Applicable).     If you need a refill on your cardiac medications before your next appointment, please call your pharmacy.   

## 2016-12-23 ENCOUNTER — Ambulatory Visit (HOSPITAL_COMMUNITY): Payer: Medicare Other | Attending: Cardiovascular Disease

## 2016-12-23 ENCOUNTER — Other Ambulatory Visit: Payer: Self-pay

## 2016-12-23 DIAGNOSIS — I420 Dilated cardiomyopathy: Secondary | ICD-10-CM | POA: Diagnosis not present

## 2016-12-23 DIAGNOSIS — I5022 Chronic systolic (congestive) heart failure: Secondary | ICD-10-CM

## 2016-12-26 ENCOUNTER — Other Ambulatory Visit: Payer: Self-pay | Admitting: Interventional Cardiology

## 2016-12-26 MED ORDER — CARVEDILOL 6.25 MG PO TABS: 6.2500 mg | ORAL_TABLET | Freq: Two times a day (BID) | ORAL | 3 refills | Status: DC

## 2016-12-26 MED ORDER — LOSARTAN POTASSIUM 100 MG PO TABS: 100.0000 mg | ORAL_TABLET | Freq: Every day | ORAL | 3 refills | Status: DC

## 2017-01-28 DIAGNOSIS — L570 Actinic keratosis: Secondary | ICD-10-CM | POA: Diagnosis not present

## 2017-01-28 DIAGNOSIS — L821 Other seborrheic keratosis: Secondary | ICD-10-CM | POA: Diagnosis not present

## 2017-01-28 DIAGNOSIS — Z85828 Personal history of other malignant neoplasm of skin: Secondary | ICD-10-CM | POA: Diagnosis not present

## 2017-04-01 DIAGNOSIS — L820 Inflamed seborrheic keratosis: Secondary | ICD-10-CM | POA: Diagnosis not present

## 2017-04-01 DIAGNOSIS — D485 Neoplasm of uncertain behavior of skin: Secondary | ICD-10-CM | POA: Diagnosis not present

## 2017-04-01 DIAGNOSIS — B356 Tinea cruris: Secondary | ICD-10-CM | POA: Diagnosis not present

## 2017-04-01 DIAGNOSIS — L570 Actinic keratosis: Secondary | ICD-10-CM | POA: Diagnosis not present

## 2017-04-08 DIAGNOSIS — Z0000 Encounter for general adult medical examination without abnormal findings: Secondary | ICD-10-CM | POA: Diagnosis not present

## 2017-04-08 DIAGNOSIS — Z23 Encounter for immunization: Secondary | ICD-10-CM | POA: Diagnosis not present

## 2017-04-08 DIAGNOSIS — I10 Essential (primary) hypertension: Secondary | ICD-10-CM | POA: Diagnosis not present

## 2017-04-08 DIAGNOSIS — K219 Gastro-esophageal reflux disease without esophagitis: Secondary | ICD-10-CM | POA: Diagnosis not present

## 2017-04-08 DIAGNOSIS — M4800 Spinal stenosis, site unspecified: Secondary | ICD-10-CM | POA: Diagnosis not present

## 2017-04-08 DIAGNOSIS — F329 Major depressive disorder, single episode, unspecified: Secondary | ICD-10-CM | POA: Diagnosis not present

## 2017-04-08 DIAGNOSIS — I5022 Chronic systolic (congestive) heart failure: Secondary | ICD-10-CM | POA: Diagnosis not present

## 2017-04-08 DIAGNOSIS — N183 Chronic kidney disease, stage 3 (moderate): Secondary | ICD-10-CM | POA: Diagnosis not present

## 2017-04-08 DIAGNOSIS — J449 Chronic obstructive pulmonary disease, unspecified: Secondary | ICD-10-CM | POA: Diagnosis not present

## 2017-07-07 DIAGNOSIS — H6123 Impacted cerumen, bilateral: Secondary | ICD-10-CM | POA: Diagnosis not present

## 2017-07-28 DIAGNOSIS — H353131 Nonexudative age-related macular degeneration, bilateral, early dry stage: Secondary | ICD-10-CM | POA: Diagnosis not present

## 2017-07-28 DIAGNOSIS — Z961 Presence of intraocular lens: Secondary | ICD-10-CM | POA: Diagnosis not present

## 2017-07-31 DIAGNOSIS — L814 Other melanin hyperpigmentation: Secondary | ICD-10-CM | POA: Diagnosis not present

## 2017-07-31 DIAGNOSIS — L309 Dermatitis, unspecified: Secondary | ICD-10-CM | POA: Diagnosis not present

## 2017-07-31 DIAGNOSIS — D229 Melanocytic nevi, unspecified: Secondary | ICD-10-CM | POA: Diagnosis not present

## 2017-07-31 DIAGNOSIS — L570 Actinic keratosis: Secondary | ICD-10-CM | POA: Diagnosis not present

## 2017-07-31 DIAGNOSIS — L821 Other seborrheic keratosis: Secondary | ICD-10-CM | POA: Diagnosis not present

## 2017-12-01 DIAGNOSIS — L821 Other seborrheic keratosis: Secondary | ICD-10-CM | POA: Diagnosis not present

## 2017-12-01 DIAGNOSIS — L814 Other melanin hyperpigmentation: Secondary | ICD-10-CM | POA: Diagnosis not present

## 2017-12-01 DIAGNOSIS — D1801 Hemangioma of skin and subcutaneous tissue: Secondary | ICD-10-CM | POA: Diagnosis not present

## 2017-12-01 DIAGNOSIS — Z85828 Personal history of other malignant neoplasm of skin: Secondary | ICD-10-CM | POA: Diagnosis not present

## 2017-12-01 DIAGNOSIS — D229 Melanocytic nevi, unspecified: Secondary | ICD-10-CM | POA: Diagnosis not present

## 2017-12-13 ENCOUNTER — Other Ambulatory Visit: Payer: Self-pay | Admitting: Interventional Cardiology

## 2017-12-16 ENCOUNTER — Other Ambulatory Visit: Payer: Self-pay | Admitting: Interventional Cardiology

## 2018-01-13 ENCOUNTER — Other Ambulatory Visit: Payer: Self-pay | Admitting: Interventional Cardiology

## 2018-01-22 ENCOUNTER — Other Ambulatory Visit: Payer: Self-pay | Admitting: *Deleted

## 2018-01-22 ENCOUNTER — Telehealth: Payer: Self-pay | Admitting: Interventional Cardiology

## 2018-01-22 MED ORDER — CARVEDILOL 6.25 MG PO TABS: 6.2500 mg | ORAL_TABLET | Freq: Two times a day (BID) | ORAL | 0 refills | Status: DC

## 2018-01-22 MED ORDER — LOSARTAN POTASSIUM 100 MG PO TABS: 100.0000 mg | ORAL_TABLET | Freq: Every day | ORAL | 0 refills | Status: DC

## 2018-01-22 NOTE — Telephone Encounter (Signed)
New Message:      *STAT* If patient is at the pharmacy, call can be transferred to refill team.   1. Which medications need to be refilled? (please list name of each medication and dose if known) carvedilol (COREG) 6.25 MG tablet  And losartan (COZAAR) 100 MG tablet  2. Which pharmacy/location (including street and city if local pharmacy) is medication to be sent to? Take 1 tablet (6.25 mg total) by mouth 2 (two) times daily. Please make overdue appt with Dr. [name] before anymore refills. 2nd attempt and Take 1 tablet (100 mg total) by mouth daily. Please make overdue appt with Dr. [name] before anymore refills. 2nd attempt  3. Do they need a 30 day or 90 day supply? 90 Days

## 2018-02-03 ENCOUNTER — Encounter (HOSPITAL_COMMUNITY): Payer: Self-pay | Admitting: Physician Assistant

## 2018-02-03 ENCOUNTER — Inpatient Hospital Stay (HOSPITAL_COMMUNITY)
Admission: EM | Admit: 2018-02-03 | Discharge: 2018-02-11 | DRG: 243 | Disposition: A | Discharge status: Skilled Nursing Facility | Payer: No Typology Code available for payment source | Attending: Cardiology | Admitting: Cardiology

## 2018-02-03 ENCOUNTER — Emergency Department (HOSPITAL_COMMUNITY): Payer: No Typology Code available for payment source

## 2018-02-03 DIAGNOSIS — R55 Syncope and collapse: Secondary | ICD-10-CM | POA: Diagnosis not present

## 2018-02-03 DIAGNOSIS — Z48812 Encounter for surgical aftercare following surgery on the circulatory system: Secondary | ICD-10-CM | POA: Diagnosis not present

## 2018-02-03 DIAGNOSIS — Z79899 Other long term (current) drug therapy: Secondary | ICD-10-CM

## 2018-02-03 DIAGNOSIS — Z96641 Presence of right artificial hip joint: Secondary | ICD-10-CM | POA: Diagnosis present

## 2018-02-03 DIAGNOSIS — I509 Heart failure, unspecified: Secondary | ICD-10-CM | POA: Diagnosis not present

## 2018-02-03 DIAGNOSIS — I110 Hypertensive heart disease with heart failure: Secondary | ICD-10-CM | POA: Diagnosis not present

## 2018-02-03 DIAGNOSIS — J449 Chronic obstructive pulmonary disease, unspecified: Secondary | ICD-10-CM | POA: Diagnosis present

## 2018-02-03 DIAGNOSIS — K219 Gastro-esophageal reflux disease without esophagitis: Secondary | ICD-10-CM | POA: Diagnosis present

## 2018-02-03 DIAGNOSIS — Y92413 State road as the place of occurrence of the external cause: Secondary | ICD-10-CM | POA: Diagnosis not present

## 2018-02-03 DIAGNOSIS — I10 Essential (primary) hypertension: Secondary | ICD-10-CM

## 2018-02-03 DIAGNOSIS — N401 Enlarged prostate with lower urinary tract symptoms: Secondary | ICD-10-CM | POA: Diagnosis present

## 2018-02-03 DIAGNOSIS — Z9049 Acquired absence of other specified parts of digestive tract: Secondary | ICD-10-CM

## 2018-02-03 DIAGNOSIS — I130 Hypertensive heart and chronic kidney disease with heart failure and stage 1 through stage 4 chronic kidney disease, or unspecified chronic kidney disease: Secondary | ICD-10-CM | POA: Diagnosis present

## 2018-02-03 DIAGNOSIS — Z950 Presence of cardiac pacemaker: Secondary | ICD-10-CM

## 2018-02-03 DIAGNOSIS — Z881 Allergy status to other antibiotic agents status: Secondary | ICD-10-CM

## 2018-02-03 DIAGNOSIS — Z880 Allergy status to penicillin: Secondary | ICD-10-CM | POA: Diagnosis not present

## 2018-02-03 DIAGNOSIS — R931 Abnormal findings on diagnostic imaging of heart and coronary circulation: Secondary | ICD-10-CM | POA: Diagnosis not present

## 2018-02-03 DIAGNOSIS — R0609 Other forms of dyspnea: Secondary | ICD-10-CM | POA: Diagnosis not present

## 2018-02-03 DIAGNOSIS — G629 Polyneuropathy, unspecified: Secondary | ICD-10-CM | POA: Diagnosis present

## 2018-02-03 DIAGNOSIS — R404 Transient alteration of awareness: Secondary | ICD-10-CM | POA: Diagnosis not present

## 2018-02-03 DIAGNOSIS — R279 Unspecified lack of coordination: Secondary | ICD-10-CM | POA: Diagnosis not present

## 2018-02-03 DIAGNOSIS — I5032 Chronic diastolic (congestive) heart failure: Secondary | ICD-10-CM | POA: Diagnosis present

## 2018-02-03 DIAGNOSIS — S0990XA Unspecified injury of head, initial encounter: Secondary | ICD-10-CM | POA: Diagnosis not present

## 2018-02-03 DIAGNOSIS — S3991XA Unspecified injury of abdomen, initial encounter: Secondary | ICD-10-CM | POA: Diagnosis not present

## 2018-02-03 DIAGNOSIS — R0789 Other chest pain: Secondary | ICD-10-CM | POA: Diagnosis not present

## 2018-02-03 DIAGNOSIS — Z886 Allergy status to analgesic agent status: Secondary | ICD-10-CM

## 2018-02-03 DIAGNOSIS — Z8249 Family history of ischemic heart disease and other diseases of the circulatory system: Secondary | ICD-10-CM

## 2018-02-03 DIAGNOSIS — N182 Chronic kidney disease, stage 2 (mild): Secondary | ICD-10-CM | POA: Diagnosis present

## 2018-02-03 DIAGNOSIS — I2510 Atherosclerotic heart disease of native coronary artery without angina pectoris: Secondary | ICD-10-CM | POA: Diagnosis not present

## 2018-02-03 DIAGNOSIS — Z882 Allergy status to sulfonamides status: Secondary | ICD-10-CM | POA: Diagnosis not present

## 2018-02-03 DIAGNOSIS — I5042 Chronic combined systolic (congestive) and diastolic (congestive) heart failure: Secondary | ICD-10-CM | POA: Diagnosis present

## 2018-02-03 DIAGNOSIS — Z743 Need for continuous supervision: Secondary | ICD-10-CM | POA: Diagnosis not present

## 2018-02-03 DIAGNOSIS — I5022 Chronic systolic (congestive) heart failure: Secondary | ICD-10-CM | POA: Diagnosis present

## 2018-02-03 DIAGNOSIS — I5030 Unspecified diastolic (congestive) heart failure: Secondary | ICD-10-CM | POA: Diagnosis not present

## 2018-02-03 DIAGNOSIS — I472 Ventricular tachycardia: Secondary | ICD-10-CM | POA: Diagnosis present

## 2018-02-03 DIAGNOSIS — Z95818 Presence of other cardiac implants and grafts: Secondary | ICD-10-CM

## 2018-02-03 DIAGNOSIS — I441 Atrioventricular block, second degree: Principal | ICD-10-CM

## 2018-02-03 DIAGNOSIS — S299XXA Unspecified injury of thorax, initial encounter: Secondary | ICD-10-CM | POA: Diagnosis not present

## 2018-02-03 DIAGNOSIS — Z955 Presence of coronary angioplasty implant and graft: Secondary | ICD-10-CM | POA: Diagnosis not present

## 2018-02-03 DIAGNOSIS — N400 Enlarged prostate without lower urinary tract symptoms: Secondary | ICD-10-CM | POA: Diagnosis not present

## 2018-02-03 DIAGNOSIS — R410 Disorientation, unspecified: Secondary | ICD-10-CM | POA: Diagnosis not present

## 2018-02-03 HISTORY — DX: Chronic diastolic (congestive) heart failure: I50.32

## 2018-02-03 HISTORY — DX: Unspecified malignant neoplasm of skin, unspecified: C44.90

## 2018-02-03 HISTORY — DX: Atrioventricular block, second degree: I44.1

## 2018-02-03 HISTORY — DX: Personal history of other diseases of the digestive system: Z87.19

## 2018-02-03 HISTORY — DX: Syncope and collapse: R55

## 2018-02-03 HISTORY — DX: Unspecified osteoarthritis, unspecified site: M19.90

## 2018-02-03 HISTORY — DX: Other complications of anesthesia, initial encounter: T88.59XA

## 2018-02-03 HISTORY — DX: Adverse effect of unspecified anesthetic, initial encounter: T41.45XA

## 2018-02-03 HISTORY — DX: Cyst of kidney, acquired: N28.1

## 2018-02-03 LAB — CBC WITH DIFFERENTIAL/PLATELET
ABS IMMATURE GRANULOCYTES: 0.1 10*3/uL (ref 0.0–0.1)
BASOS ABS: 0 10*3/uL (ref 0.0–0.1)
Basophils Relative: 0 %
EOS PCT: 1 %
Eosinophils Absolute: 0.1 10*3/uL (ref 0.0–0.7)
HCT: 39.1 % (ref 39.0–52.0)
HEMOGLOBIN: 14.2 g/dL (ref 13.0–17.0)
Immature Granulocytes: 1 %
LYMPHS PCT: 10 %
Lymphs Abs: 1.1 10*3/uL (ref 0.7–4.0)
MCH: 38.6 pg — ABNORMAL HIGH (ref 26.0–34.0)
MCHC: 36.3 g/dL — ABNORMAL HIGH (ref 30.0–36.0)
MCV: 106.3 fL — ABNORMAL HIGH (ref 78.0–100.0)
Monocytes Absolute: 0.8 10*3/uL (ref 0.1–1.0)
Monocytes Relative: 7 %
NEUTROS ABS: 8.8 10*3/uL — AB (ref 1.7–7.7)
Neutrophils Relative %: 81 %
Platelets: 218 10*3/uL (ref 150–400)
RBC: 3.68 MIL/uL — AB (ref 4.22–5.81)
RDW: 14 % (ref 11.5–15.5)
WBC: 10.8 10*3/uL — AB (ref 4.0–10.5)

## 2018-02-03 LAB — URINALYSIS, ROUTINE W REFLEX MICROSCOPIC
Bilirubin Urine: NEGATIVE
Glucose, UA: NEGATIVE mg/dL
Ketones, ur: NEGATIVE mg/dL
LEUKOCYTES UA: NEGATIVE
Nitrite: NEGATIVE
PROTEIN: 30 mg/dL — AB
Specific Gravity, Urine: 1.009 (ref 1.005–1.030)
pH: 6 (ref 5.0–8.0)

## 2018-02-03 LAB — CREATININE, SERUM
CREATININE: 1.4 mg/dL — AB (ref 0.61–1.24)
GFR calc non Af Amer: 43 mL/min — ABNORMAL LOW (ref >60)
GFR, EST AFRICAN AMERICAN: 50 mL/min — AB (ref >60)

## 2018-02-03 LAB — I-STAT CHEM 8, ED
BUN: 29 mg/dL — AB (ref 8–23)
CREATININE: 1.5 mg/dL — AB (ref 0.61–1.24)
Calcium, Ion: 1.18 mmol/L (ref 1.15–1.40)
Chloride: 105 mmol/L (ref 98–111)
Glucose, Bld: 124 mg/dL — ABNORMAL HIGH (ref 70–99)
HEMATOCRIT: 40 % (ref 39.0–52.0)
Hemoglobin: 13.6 g/dL (ref 13.0–17.0)
POTASSIUM: 4.3 mmol/L (ref 3.5–5.1)
Sodium: 141 mmol/L (ref 135–145)
TCO2: 27 mmol/L (ref 22–32)

## 2018-02-03 LAB — BASIC METABOLIC PANEL
Anion gap: 9 (ref 5–15)
BUN: 23 mg/dL (ref 8–23)
CHLORIDE: 105 mmol/L (ref 98–111)
CO2: 27 mmol/L (ref 22–32)
CREATININE: 1.38 mg/dL — AB (ref 0.61–1.24)
Calcium: 8.9 mg/dL (ref 8.9–10.3)
GFR, EST AFRICAN AMERICAN: 51 mL/min — AB (ref >60)
GFR, EST NON AFRICAN AMERICAN: 44 mL/min — AB (ref >60)
Glucose, Bld: 102 mg/dL — ABNORMAL HIGH (ref 70–99)
Potassium: 4 mmol/L (ref 3.5–5.1)
SODIUM: 141 mmol/L (ref 135–145)

## 2018-02-03 LAB — MAGNESIUM: MAGNESIUM: 2.2 mg/dL (ref 1.7–2.4)

## 2018-02-03 LAB — TSH: TSH: 0.648 u[IU]/mL (ref 0.350–4.500)

## 2018-02-03 LAB — TROPONIN I

## 2018-02-03 MED ORDER — TETANUS-DIPHTH-ACELL PERTUSSIS 5-2.5-18.5 LF-MCG/0.5 IM SUSP
0.5000 mL | Freq: Once | INTRAMUSCULAR | Status: AC
  Administered 2018-02-03: 0.5 mL via INTRAMUSCULAR
  Filled 2018-02-03: qty 0.5

## 2018-02-03 MED ORDER — PANTOPRAZOLE SODIUM 40 MG PO TBEC
40.0000 mg | DELAYED_RELEASE_TABLET | Freq: Every day | ORAL | Status: DC
  Administered 2018-02-04 – 2018-02-11 (×8): 40 mg via ORAL
  Filled 2018-02-03 (×8): qty 1

## 2018-02-03 MED ORDER — ENOXAPARIN SODIUM 40 MG/0.4ML ~~LOC~~ SOLN
40.0000 mg | SUBCUTANEOUS | Status: DC
  Administered 2018-02-03 – 2018-02-04 (×2): 40 mg via SUBCUTANEOUS
  Filled 2018-02-03 (×2): qty 0.4

## 2018-02-03 MED ORDER — LOSARTAN POTASSIUM 50 MG PO TABS
100.0000 mg | ORAL_TABLET | Freq: Every day | ORAL | Status: DC
  Administered 2018-02-04 – 2018-02-11 (×8): 100 mg via ORAL
  Filled 2018-02-03 (×8): qty 2

## 2018-02-03 MED ORDER — GABAPENTIN 300 MG PO CAPS
300.0000 mg | ORAL_CAPSULE | Freq: Three times a day (TID) | ORAL | Status: DC
  Administered 2018-02-03 – 2018-02-11 (×21): 300 mg via ORAL
  Filled 2018-02-03 (×22): qty 1

## 2018-02-03 MED ORDER — POLYETHYLENE GLYCOL 3350 17 G PO PACK
17.0000 g | PACK | Freq: Once | ORAL | Status: AC
  Administered 2018-02-03: 17 g via ORAL
  Filled 2018-02-03: qty 1

## 2018-02-03 MED ORDER — SODIUM CHLORIDE 0.9% FLUSH
3.0000 mL | Freq: Two times a day (BID) | INTRAVENOUS | Status: DC
  Administered 2018-02-03 – 2018-02-09 (×8): 3 mL via INTRAVENOUS

## 2018-02-03 MED ORDER — FLUTICASONE PROPIONATE 50 MCG/ACT NA SUSP: 1.0000 | Freq: Every day | NASAL | Status: DC | PRN

## 2018-02-03 MED ORDER — VITAMIN B-12 1000 MCG PO TABS
1000.0000 ug | ORAL_TABLET | Freq: Every day | ORAL | Status: DC
  Administered 2018-02-04 – 2018-02-11 (×8): 1000 ug via ORAL
  Filled 2018-02-03 (×8): qty 1

## 2018-02-03 MED ORDER — ZOLPIDEM TARTRATE 5 MG PO TABS
5.0000 mg | ORAL_TABLET | Freq: Every evening | ORAL | Status: DC | PRN
  Administered 2018-02-10: 5 mg via ORAL
  Filled 2018-02-03: qty 1

## 2018-02-03 MED ORDER — SODIUM CHLORIDE 0.9% FLUSH: 3.0000 mL | INTRAVENOUS | Status: DC | PRN

## 2018-02-03 MED ORDER — RISAQUAD PO CAPS
1.0000 | ORAL_CAPSULE | Freq: Every day | ORAL | Status: DC
  Administered 2018-02-04 – 2018-02-11 (×8): 1 via ORAL
  Filled 2018-02-03 (×8): qty 1

## 2018-02-03 MED ORDER — AMLODIPINE BESYLATE 5 MG PO TABS
5.0000 mg | ORAL_TABLET | Freq: Every day | ORAL | Status: DC
  Administered 2018-02-03 – 2018-02-07 (×5): 5 mg via ORAL
  Filled 2018-02-03 (×5): qty 1

## 2018-02-03 MED ORDER — ACETAMINOPHEN 325 MG PO TABS: 650.0000 mg | ORAL_TABLET | ORAL | Status: DC | PRN

## 2018-02-03 MED ORDER — NITROGLYCERIN 0.4 MG SL SUBL: 0.4000 mg | SUBLINGUAL_TABLET | SUBLINGUAL | Status: DC | PRN

## 2018-02-03 MED ORDER — ONDANSETRON HCL 4 MG/2ML IJ SOLN
4.0000 mg | Freq: Four times a day (QID) | INTRAMUSCULAR | Status: DC | PRN
  Administered 2018-02-03: 4 mg via INTRAVENOUS
  Filled 2018-02-03: qty 2

## 2018-02-03 MED ORDER — SODIUM CHLORIDE 0.9 % IV SOLN: 250.0000 mL | INTRAVENOUS | Status: DC | PRN

## 2018-02-03 MED ORDER — ALPRAZOLAM 0.25 MG PO TABS
0.2500 mg | ORAL_TABLET | Freq: Two times a day (BID) | ORAL | Status: DC | PRN
  Administered 2018-02-08 – 2018-02-11 (×2): 0.25 mg via ORAL
  Filled 2018-02-03 (×2): qty 1

## 2018-02-03 MED ORDER — IOHEXOL 300 MG/ML  SOLN
100.0000 mL | Freq: Once | INTRAMUSCULAR | Status: AC | PRN
  Administered 2018-02-03: 100 mL via INTRAVENOUS
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
## 2018-02-04 ENCOUNTER — Inpatient Hospital Stay (HOSPITAL_COMMUNITY): Payer: No Typology Code available for payment source

## 2018-02-04 DIAGNOSIS — I5030 Unspecified diastolic (congestive) heart failure: Secondary | ICD-10-CM

## 2018-02-04 DIAGNOSIS — R931 Abnormal findings on diagnostic imaging of heart and coronary circulation: Secondary | ICD-10-CM

## 2018-02-04 DIAGNOSIS — I5032 Chronic diastolic (congestive) heart failure: Secondary | ICD-10-CM

## 2018-02-04 LAB — COMPREHENSIVE METABOLIC PANEL
ALT: 13 U/L (ref 0–44)
ANION GAP: 7 (ref 5–15)
AST: 19 U/L (ref 15–41)
Albumin: 2.7 g/dL — ABNORMAL LOW (ref 3.5–5.0)
Alkaline Phosphatase: 62 U/L (ref 38–126)
BUN: 25 mg/dL — ABNORMAL HIGH (ref 8–23)
CO2: 25 mmol/L (ref 22–32)
CREATININE: 1.43 mg/dL — AB (ref 0.61–1.24)
Calcium: 8.1 mg/dL — ABNORMAL LOW (ref 8.9–10.3)
Chloride: 107 mmol/L (ref 98–111)
GFR, EST AFRICAN AMERICAN: 49 mL/min — AB (ref >60)
GFR, EST NON AFRICAN AMERICAN: 42 mL/min — AB (ref >60)
Glucose, Bld: 108 mg/dL — ABNORMAL HIGH (ref 70–99)
POTASSIUM: 4.1 mmol/L (ref 3.5–5.1)
SODIUM: 139 mmol/L (ref 135–145)
Total Bilirubin: 1.2 mg/dL (ref 0.3–1.2)
Total Protein: 5 g/dL — ABNORMAL LOW (ref 6.5–8.1)

## 2018-02-04 LAB — CBC
HEMATOCRIT: 41.7 % (ref 39.0–52.0)
HEMOGLOBIN: 14.1 g/dL (ref 13.0–17.0)
MCH: 33.4 pg (ref 26.0–34.0)
MCHC: 33.8 g/dL (ref 30.0–36.0)
MCV: 98.8 fL (ref 78.0–100.0)
PLATELETS: 218 10*3/uL (ref 150–400)
RBC: 4.22 MIL/uL (ref 4.22–5.81)
RDW: 12.5 % (ref 11.5–15.5)
WBC: 8.4 10*3/uL (ref 4.0–10.5)

## 2018-02-04 LAB — ECHOCARDIOGRAM COMPLETE: WEIGHTICAEL: 2610.25 [oz_av]

## 2018-02-04 LAB — TROPONIN I
Troponin I: <0.03 ng/mL (ref <0.03)
Troponin I: <0.03 ng/mL (ref <0.03)
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
## 2018-02-04 NOTE — Plan of Care (Signed)
  Problem: Activity: Goal: Risk for activity intolerance will decrease Outcome: Progressing   Problem: Education: Goal: Knowledge of General Education information will improve Description Including pain rating scale, medication(s)/side effects and non-pharmacologic comfort measures Outcome: Completed/Met   Problem: Clinical Measurements: Goal: Respiratory complications will improve Outcome: Completed/Met   Problem: Pain Managment: Goal: General experience of comfort will improve Outcome: Completed/Met

## 2018-02-04 NOTE — Progress Notes (Signed)
Pt. transported from ER via stretcher to 6E-06; alert and oriented x4; hard of hearing and hearing aides with family; transferred to bed. Oriented to room and call button; son in-law at bedside.

## 2018-02-04 NOTE — Progress Notes (Addendum)
  Echocardiogram 2D Echocardiogram has been performed.  Patient unable to alter position.   Frank Elliott 02/04/2018, 10:15 AM

## 2018-02-04 NOTE — Progress Notes (Signed)
Paged Dr. Paticia Stack to inform him around 0350 pt. had 16 beat run of VT- pt. resting/sleeping and no distress noted; pt. arousable and talking.

## 2018-02-04 NOTE — Plan of Care (Signed)
  Problem: Education: Goal: Knowledge of General Education information will improve Description Including pain rating scale, medication(s)/side effects and non-pharmacologic comfort measures Outcome: Progressing Note:  POC aand orders reviewed with pt.

## 2018-02-04 NOTE — Progress Notes (Signed)
CCMD called pt. ? had 2 HB-1/ 2HB-2 around 2230 and 3 beats VT at 0109 and Dr. Paticia Stack paged and RTC.

## 2018-02-05 ENCOUNTER — Other Ambulatory Visit: Payer: Self-pay

## 2018-02-05 ENCOUNTER — Encounter (HOSPITAL_COMMUNITY): Payer: Self-pay | Admitting: General Practice

## 2018-02-05 ENCOUNTER — Encounter (HOSPITAL_COMMUNITY)
Admission: EM | Disposition: A | Discharge status: Skilled Nursing Facility | Payer: Self-pay | Source: Home / Self Care | Attending: Cardiology

## 2018-02-05 DIAGNOSIS — I2510 Atherosclerotic heart disease of native coronary artery without angina pectoris: Secondary | ICD-10-CM

## 2018-02-05 HISTORY — PX: ULTRASOUND GUIDANCE FOR VASCULAR ACCESS: SHX6516

## 2018-02-05 HISTORY — PX: LEFT HEART CATH AND CORONARY ANGIOGRAPHY: CATH118249

## 2018-02-05 HISTORY — PX: CARDIAC CATHETERIZATION: SHX172

## 2018-02-05 LAB — CBC
HCT: 38.2 % — ABNORMAL LOW (ref 39.0–52.0)
Hemoglobin: 14.1 g/dL (ref 13.0–17.0)
MCH: 38.7 pg — ABNORMAL HIGH (ref 26.0–34.0)
MCHC: 36.9 g/dL — AB (ref 30.0–36.0)
MCV: 104.9 fL — ABNORMAL HIGH (ref 78.0–100.0)
PLATELETS: 224 10*3/uL (ref 150–400)
RBC: 3.64 MIL/uL — AB (ref 4.22–5.81)
RDW: 12.9 % (ref 11.5–15.5)
WBC: 8 10*3/uL (ref 4.0–10.5)

## 2018-02-05 LAB — CREATININE, SERUM
Creatinine, Ser: 1.16 mg/dL (ref 0.61–1.24)
GFR, EST NON AFRICAN AMERICAN: 54 mL/min — AB (ref >60)

## 2018-02-05 SURGERY — LEFT HEART CATH AND CORONARY ANGIOGRAPHY
Anesthesia: LOCAL

## 2018-02-05 MED ORDER — SODIUM CHLORIDE 0.9 % WEIGHT BASED INFUSION
0.6000 mL/kg/h | INTRAVENOUS | Status: AC
  Administered 2018-02-05: 0.6 mL/kg/h via INTRAVENOUS

## 2018-02-05 MED ORDER — VERAPAMIL HCL 2.5 MG/ML IV SOLN
INTRAVENOUS | Status: AC
  Filled 2018-02-05: qty 2

## 2018-02-05 MED ORDER — HEPARIN (PORCINE) IN NACL 1000-0.9 UT/500ML-% IV SOLN
INTRAVENOUS | Status: DC | PRN
  Administered 2018-02-05: 500 mL

## 2018-02-05 MED ORDER — LIDOCAINE HCL (PF) 1 % IJ SOLN
INTRAMUSCULAR | Status: DC | PRN
  Administered 2018-02-05: 2 mL

## 2018-02-05 MED ORDER — HEPARIN SODIUM (PORCINE) 1000 UNIT/ML IJ SOLN
INTRAMUSCULAR | Status: DC | PRN
  Administered 2018-02-05: 3000 [IU] via INTRAVENOUS

## 2018-02-05 MED ORDER — SODIUM CHLORIDE 0.9% FLUSH
3.0000 mL | Freq: Two times a day (BID) | INTRAVENOUS | Status: DC
  Administered 2018-02-06 – 2018-02-09 (×3): 3 mL via INTRAVENOUS

## 2018-02-05 MED ORDER — ACETAMINOPHEN 325 MG PO TABS
650.0000 mg | ORAL_TABLET | ORAL | Status: DC | PRN
  Administered 2018-02-11: 650 mg via ORAL
  Filled 2018-02-05: qty 2

## 2018-02-05 MED ORDER — SODIUM CHLORIDE 0.9 % IV SOLN: 250.0000 mL | INTRAVENOUS | Status: DC | PRN

## 2018-02-05 MED ORDER — VERAPAMIL HCL 2.5 MG/ML IV SOLN
INTRAVENOUS | Status: DC | PRN
  Administered 2018-02-05 (×2): 10 mL via INTRA_ARTERIAL

## 2018-02-05 MED ORDER — SODIUM CHLORIDE 0.9% FLUSH: 3.0000 mL | INTRAVENOUS | Status: DC | PRN

## 2018-02-05 MED ORDER — IOHEXOL 350 MG/ML SOLN
INTRAVENOUS | Status: DC | PRN
  Administered 2018-02-05: 40 mL via INTRA_ARTERIAL

## 2018-02-05 MED ORDER — SODIUM CHLORIDE 0.9% FLUSH: 3.0000 mL | Freq: Two times a day (BID) | INTRAVENOUS | Status: DC

## 2018-02-05 MED ORDER — SODIUM CHLORIDE 0.9 % WEIGHT BASED INFUSION
1.0000 mL/kg/h | INTRAVENOUS | Status: DC
  Administered 2018-02-05: 1 mL/kg/h via INTRAVENOUS

## 2018-02-05 MED ORDER — HEPARIN (PORCINE) IN NACL 1000-0.9 UT/500ML-% IV SOLN
INTRAVENOUS | Status: AC
  Filled 2018-02-05: qty 1500

## 2018-02-05 MED ORDER — LIDOCAINE HCL (PF) 1 % IJ SOLN
INTRAMUSCULAR | Status: AC
  Filled 2018-02-05: qty 30

## 2018-02-05 MED ORDER — SODIUM CHLORIDE 0.9 % WEIGHT BASED INFUSION
3.0000 mL/kg/h | INTRAVENOUS | Status: DC
  Administered 2018-02-05: 3 mL/kg/h via INTRAVENOUS

## 2018-02-05 MED ORDER — ONDANSETRON HCL 4 MG/2ML IJ SOLN: 4.0000 mg | Freq: Four times a day (QID) | INTRAMUSCULAR | Status: DC | PRN

## 2018-02-05 MED ORDER — ENOXAPARIN SODIUM 30 MG/0.3ML ~~LOC~~ SOLN: 30.0000 mg | SUBCUTANEOUS | Status: DC

## 2018-02-05 SURGICAL SUPPLY — 12 items
CATH INFINITI 4FR JL3.5 (CATHETERS) ×3 IMPLANT
CATH INFINITI JR4 5F (CATHETERS) ×3 IMPLANT
DEVICE RAD COMP TR BAND LRG (VASCULAR PRODUCTS) ×3 IMPLANT
GLIDESHEATH SLEND A-KIT 6F 22G (SHEATH) ×3 IMPLANT
GUIDEWIRE INQWIRE 1.5J.035X260 (WIRE) ×2 IMPLANT
INQWIRE 1.5J .035X260CM (WIRE) ×3
KIT HEART LEFT (KITS) ×3 IMPLANT
PACK CARDIAC CATHETERIZATION (CUSTOM PROCEDURE TRAY) ×3 IMPLANT
SHEATH PROBE COVER 6X72 (BAG) ×3 IMPLANT
TRANSDUCER W/STOPCOCK (MISCELLANEOUS) ×3 IMPLANT
TUBING CIL FLEX 10 FLL-RA (TUBING) ×3 IMPLANT
WIRE HI TORQ VERSACORE-J 145CM (WIRE) ×3 IMPLANT
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
## 2018-02-05 NOTE — Progress Notes (Signed)
Dr [name] paged and made aware of NO aspirin ordered pre-cath. Pharmacy as well notified. Leveda Anna, BSN, RN

## 2018-02-05 NOTE — Interval H&P Note (Signed)
Cath Lab Visit (complete for each Cath Lab visit)  Clinical Evaluation Leading to the Procedure:   ACS: No.  Non-ACS:    Anginal Classification: CCS II  Anti-ischemic medical therapy: No Therapy  Non-Invasive Test Results: No non-invasive testing performed  Prior CABG: No previous CABG      History and Physical Interval Note:  02/05/2018 3:04 PM  Frank Elliott  has presented today for surgery, with the diagnosis of non sustained vt abnormal echo with lv dysfunction  The various methods of treatment have been discussed with the patient and family. After consideration of risks, benefits and other options for treatment, the patient has consented to  Procedure(s): LEFT HEART CATH AND CORONARY ANGIOGRAPHY (N/A) as a surgical intervention .  The patient's history has been reviewed, patient examined, no change in status, stable for surgery.  I have reviewed the patient's chart and labs.  Questions were answered to the patient's satisfaction.     Belva Crome III
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
## 2018-02-06 ENCOUNTER — Encounter (HOSPITAL_COMMUNITY): Payer: Self-pay | Admitting: Interventional Cardiology

## 2018-02-06 ENCOUNTER — Encounter (HOSPITAL_COMMUNITY)
Admission: EM | Disposition: A | Discharge status: Skilled Nursing Facility | Payer: Self-pay | Source: Home / Self Care | Attending: Cardiology

## 2018-02-06 HISTORY — PX: PACEMAKER IMPLANT: EP1218

## 2018-02-06 LAB — SURGICAL PCR SCREEN
MRSA, PCR: NEGATIVE
STAPHYLOCOCCUS AUREUS: NEGATIVE

## 2018-02-06 SURGERY — PACEMAKER IMPLANT

## 2018-02-06 MED ORDER — SODIUM CHLORIDE 0.9 % IV SOLN
80.0000 mg | INTRAVENOUS | Status: AC
  Administered 2018-02-06: 80 mg
  Filled 2018-02-06: qty 2

## 2018-02-06 MED ORDER — SODIUM CHLORIDE 0.9% FLUSH
3.0000 mL | Freq: Two times a day (BID) | INTRAVENOUS | Status: DC
  Administered 2018-02-06: 3 mL via INTRAVENOUS

## 2018-02-06 MED ORDER — CHLORHEXIDINE GLUCONATE 4 % EX LIQD
60.0000 mL | Freq: Once | CUTANEOUS | Status: AC
  Administered 2018-02-06: 4 via TOPICAL

## 2018-02-06 MED ORDER — LIDOCAINE HCL (PF) 1 % IJ SOLN
INTRAMUSCULAR | Status: AC
  Filled 2018-02-06: qty 30

## 2018-02-06 MED ORDER — SODIUM CHLORIDE 0.9% FLUSH: 3.0000 mL | INTRAVENOUS | Status: DC | PRN

## 2018-02-06 MED ORDER — SODIUM CHLORIDE 0.9 % IV SOLN
INTRAVENOUS | Status: DC
  Administered 2018-02-06: 11:00:00 via INTRAVENOUS

## 2018-02-06 MED ORDER — HEPARIN (PORCINE) IN NACL 1000-0.9 UT/500ML-% IV SOLN
INTRAVENOUS | Status: DC | PRN
  Administered 2018-02-06: 500 mL

## 2018-02-06 MED ORDER — LIDOCAINE HCL (PF) 1 % IJ SOLN
INTRAMUSCULAR | Status: AC
  Filled 2018-02-06: qty 60

## 2018-02-06 MED ORDER — VANCOMYCIN HCL IN DEXTROSE 1-5 GM/200ML-% IV SOLN
1000.0000 mg | INTRAVENOUS | Status: AC
  Administered 2018-02-06: 1000 mg via INTRAVENOUS
  Filled 2018-02-06: qty 200

## 2018-02-06 MED ORDER — SODIUM CHLORIDE 0.9 % IV SOLN: 250.0000 mL | INTRAVENOUS | Status: DC

## 2018-02-06 MED ORDER — VANCOMYCIN HCL IN DEXTROSE 1-5 GM/200ML-% IV SOLN
INTRAVENOUS | Status: AC
  Filled 2018-02-06: qty 200

## 2018-02-06 MED ORDER — LIDOCAINE HCL (PF) 1 % IJ SOLN
INTRAMUSCULAR | Status: DC | PRN
  Administered 2018-02-06: 60 mL

## 2018-02-06 MED ORDER — SODIUM CHLORIDE 0.9 % IV SOLN
INTRAVENOUS | Status: AC
  Filled 2018-02-06: qty 2

## 2018-02-06 MED ORDER — CHLORHEXIDINE GLUCONATE 4 % EX LIQD: 60.0000 mL | Freq: Once | CUTANEOUS | Status: DC

## 2018-02-06 MED ORDER — VANCOMYCIN HCL IN DEXTROSE 1-5 GM/200ML-% IV SOLN
1000.0000 mg | Freq: Two times a day (BID) | INTRAVENOUS | Status: AC
  Administered 2018-02-07: 1000 mg via INTRAVENOUS
  Filled 2018-02-06: qty 200

## 2018-02-06 MED ORDER — HEPARIN (PORCINE) IN NACL 1000-0.9 UT/500ML-% IV SOLN
INTRAVENOUS | Status: AC
  Filled 2018-02-06: qty 500

## 2018-02-06 SURGICAL SUPPLY — 7 items
CABLE SURGICAL S-101-97-12 (CABLE) ×3 IMPLANT
LEAD TENDRIL MRI 52CM LPA1200M (Lead) ×3 IMPLANT
LEAD TENDRIL MRI 58CM LPA1200M (Lead) ×3 IMPLANT
PACEMAKER ASSURITY DR-RF (Pacemaker) ×3 IMPLANT
PAD PRO RADIOLUCENT 2001M-C (PAD) ×3 IMPLANT
SHEATH CLASSIC 8F (SHEATH) ×6 IMPLANT
TRAY PACEMAKER INSERTION (PACKS) ×3 IMPLANT
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
## 2018-02-06 NOTE — Discharge Instructions (Signed)
° ° °  Supplemental Discharge Instructions for  Pacemaker/Defibrillator Patients  Activity No heavy lifting or vigorous activity with your left/right arm for 6 to 8 weeks.  Do not raise your left/right arm above your head for one week.  Gradually raise your affected arm as drawn below.              02/10/18                    02/11/18                    02/12/18                  02/13/18 __  NO DRIVING until cleared to at your wound check visit  [place] the wound area clean and dry.  Do not get this area wet, no showers until cleared to at your wound check visit. - The tape/steri-strips on your wound will fall off; do not pull them off.  No bandage is needed on the site.  DO  NOT apply any creams, oils, or ointments to the wound area. - If you notice any drainage or discharge from the wound, any swelling or bruising at the site, or you develop a fever > 101? F after you are discharged home, call the office at once.  Special Instructions - You are still able to use cellular telephones; use the ear opposite the side where you have your pacemaker/defibrillator.  Avoid carrying your cellular phone near your device. - When traveling through airports, show security personnel your identification card to avoid being screened in the metal detectors.  Ask the security personnel to use the hand wand. - Avoid arc welding equipment, MRI testing (magnetic resonance imaging), TENS units (transcutaneous nerve stimulators).  Call the office for questions about other devices. - Avoid electrical appliances that are in poor condition or are not properly grounded. - Microwave ovens are safe to be near or to operate.  Additional information for defibrillator patients should your device go off: - If your device goes off ONCE and you feel fine afterward, notify the device clinic nurses. - If your device goes off ONCE and you do not feel well afterward, call 911. - If your device goes off TWICE, call  911. - If your device goes off THREE times in one day, call 911.  DO NOT DRIVE YOURSELF OR A FAMILY MEMBER WITH A DEFIBRILLATOR TO THE HOSPITAL--CALL 911.
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
## 2018-02-07 ENCOUNTER — Inpatient Hospital Stay (HOSPITAL_COMMUNITY): Payer: No Typology Code available for payment source

## 2018-02-07 MED ORDER — CARVEDILOL 6.25 MG PO TABS: 6.2500 mg | ORAL_TABLET | Freq: Two times a day (BID) | ORAL | Status: DC

## 2018-02-07 MED ORDER — POLYETHYLENE GLYCOL 3350 17 G PO PACK
17.0000 g | PACK | Freq: Every day | ORAL | Status: DC
  Administered 2018-02-07 – 2018-02-11 (×4): 17 g via ORAL
  Filled 2018-02-07 (×4): qty 1

## 2018-02-07 MED ORDER — CARVEDILOL 6.25 MG PO TABS
6.2500 mg | ORAL_TABLET | Freq: Two times a day (BID) | ORAL | Status: DC
  Administered 2018-02-08 – 2018-02-11 (×7): 6.25 mg via ORAL
  Filled 2018-02-07 (×7): qty 1

## 2018-02-07 NOTE — Progress Notes (Addendum)
    Pt became weak and nauseated as he was being discharged to his car. Staff returned him to his room. When I went to see him he was sleeping, saying he did not sleep at all last night. No focal complaints except mild nausea. We let him rest and then he awoke fully alert. When staff tried to take him to the rest room, he was very weak and was a 2 person assist. I spoke with the patient and his son With his weakness the son does not feel like he can handle him at this time. We will keep the patient over night and have PT evaluate him.  The son is fine with that but the patient does not want to stay. Hopefully the patient will come to terms with staying the night.   Daune Perch, AGNP-C [facility] 02/07/2018  5:28 PM

## 2018-02-07 NOTE — Progress Notes (Signed)
Doing well s/p PPM implant yesterday Family reports that he is going home to stay with son.  They feel comfortable with discharge.  CXR reveals stable leads, no ptx Device interrogation is reviewed and normal  DC to home  Routine wound care and device follow-up  Would also need close follow-up with Dr Thompson Caul team for CHF  Thompson Grayer MD, [facility] 02/07/2018 11:17 AM

## 2018-02-07 NOTE — Progress Notes (Signed)
Pt discharged by EP; complaints of "feeling like going to pass out" while sitting in wheelchair on the way to family's car to transport home. Brought back to room to lay down. Pecolia Ades paged and notified. Stated will come to evaluate. Patient and son at the bedside made aware. Elita Boone, BSN, RN
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
## 2018-02-07 NOTE — Plan of Care (Signed)
Pacemaker device interrogation reviewed. Pt to discharge home will family.

## 2018-02-08 DIAGNOSIS — R0609 Other forms of dyspnea: Secondary | ICD-10-CM
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
## 2018-02-08 NOTE — Clinical Social Work Note (Signed)
Clinical Social Work Assessment  Patient Details  Name: Frank Elliott MRN: [id number] Date of Birth: 03-23-1926  Date of referral:  02/08/18               Reason for consult:  Facility Placement                Permission sought to share information with:  Facility Sport and exercise psychologist, Family Supports Permission granted to share information::  Yes, Verbal Permission Granted  Name::     Doyel Mulkern  Agency::  SNF's  Relationship::  Son  Contact Information:  [phone]  Housing/Transportation Living arrangements for the past 2 months:  [place] of Information:  Adult Children Patient Interpreter Needed:  None Criminal Activity/Legal Involvement Pertinent to Current Situation/Hospitalization:  No - Comment as needed Significant Relationships:  Adult Children Lives with:  Self Do you feel safe going back to the place where you live?  No Need for family participation in patient care:  Yes (Comment)  Care giving concerns:  CSW received consult regarding discharge planning.  CSW spoke spoke with patient's son at bedside.  Patient has been living at home alone and driving prior to being admitted to hospital.  Patient's son stated that patient is very weak and not able to take care of himself and ambulate in his current home.     Social Worker assessment / plan:  CSW spoke with patient's son concerning patient's disposition planning.  Patient's son would like for patient to received SNF services for PT and OT at a SNF  in [place], [place].  The patient's son would prefer for the patient to be near family supports.  Patient will reside with  son after receiving rehab at the SNF.  Employment status:  Retired Forensic scientist:  Medicare PT Recommendations:  [place] / Referral to community resources:     Patient/Family's Response to care: Patient's son states agreement with discharge planning. Patient will be transported by son at  discharging to SNF if patient is able to transport by vehicle.   Patient/Family's Understanding of and Emotional Response to Diagnosis, Current Treatment, and Prognosis: Patient's son is realistic regarding medical needs and is hopeful for placement at a SNF facility.  Patient's son states agreement with discharge planning and expressed understanding of CSW role in the discharging process.  Patient's son had no questions regarding medical treatment or plan.  Emotional Assessment Appearance:  Appears stated age Attitude/Demeanor/Rapport:  Unable to Assess Affect (typically observed):  Unable to Assess Orientation:  Oriented to Self, Oriented to Place, Oriented to  Time, Oriented to Situation Alcohol / Substance use:  Not Applicable Psych involvement (Current and /or in the community):  No (Comment)  Discharge Needs  Concerns to be addressed:  Discharge Planning Concerns Readmission within the last 30 days:  No Current discharge risk:  Lives alone Barriers to Discharge:  Continued Medical Work up   [employer], [place] 02/08/2018, 5:29 PM
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
## 2018-02-09 ENCOUNTER — Encounter (HOSPITAL_COMMUNITY): Payer: Self-pay | Admitting: Cardiology

## 2018-02-09 DIAGNOSIS — I509 Heart failure, unspecified: Secondary | ICD-10-CM

## 2018-02-09 NOTE — NC FL2 (Signed)
[place] LEVEL OF CARE SCREENING TOOL     IDENTIFICATION  Patient Name: Frank Elliott Birthdate: [birth year] Sex: male Admission Date (Current Location): 02/03/2018  [facility] and [place] Number:  Herbalist and Address:         Provider Number: [id number]  Attending Physician Name and Address:  Arnoldo Lenis, MD  Relative Name and Phone Number:  Lenny Fiumara [phone]    Current Level of Care: Hospital Recommended Level of Care: [place] Prior Approval Number:    Date Approved/Denied: 02/09/18 PASRR Number: [id number]  Discharge Plan: SNF    Current Diagnoses: Patient Active Problem List   Diagnosis Date Noted  . Syncope 02/03/2018  . AV block, [place] 2 02/03/2018  . Bilateral lower extremity edema 12/19/2015  . Chronic systolic heart failure ([place]) 04/25/2013  . HTN (hypertension) 04/20/2012  . Chronic diastolic CHF (congestive heart failure) ([place]) 04/18/2012  . COPD (chronic obstructive pulmonary disease) ([place]) 04/18/2012    Orientation RESPIRATION BLADDER Height & Weight     Self, Time, Situation, Place  Normal Incontinent, External catheter(Catheter placed 02/05/18) Weight: 158 lb 9.6 oz (71.9 kg) Height:  5' 9.5" (176.5 cm)  BEHAVIORAL SYMPTOMS/MOOD NEUROLOGICAL BOWEL NUTRITION STATUS  (NA)   Continent Diet(heart healthy thin liquids)  AMBULATORY STATUS COMMUNICATION OF NEEDS Skin     Verbally Surgical wounds(left chest, non adhesive dressing)                       Personal Care Assistance Level of Assistance              Functional Limitations Info             SPECIAL CARE FACTORS FREQUENCY                       Contractures Contractures Info: Not present    Additional Factors Info  Code Status, Allergies Code Status Info: FULL Allergies Info: Aspirin, Whiskey Alcohol, Amoxicillin, Avelox Moxifloxacin Hcl In Nacl, Lisinopril, Penicillins, Sulfa Antibiotics, Tape, Zithromax  Azithromycin           Current Medications (02/09/2018):  This is the current hospital active medication list Current Facility-Administered Medications  Medication Dose Route Frequency Provider Last Rate Last Dose  . 0.9 %  sodium chloride infusion  250 mL Intravenous PRN Belva Crome, MD      . acetaminophen (TYLENOL) tablet 650 mg  650 mg Oral Q4H PRN Belva Crome, MD      . acidophilus (RISAQUAD) capsule 1 capsule  1 capsule Oral Daily Belva Crome, MD   1 capsule at 02/09/18 0950  . ALPRAZolam Duanne Moron) tablet 0.25 mg  0.25 mg Oral BID PRN Belva Crome, MD   0.25 mg at 02/08/18 2347  . carvedilol (COREG) tablet 6.25 mg  6.25 mg Oral BID WC Ledora Bottcher, PA   6.25 mg at 02/09/18 0950  . fluticasone (FLONASE) 50 MCG/ACT nasal spray 1 spray  1 spray Each Nare Daily PRN Belva Crome, MD      . gabapentin (NEURONTIN) capsule 300 mg  300 mg Oral TID Belva Crome, MD   300 mg at 02/09/18 0951  . losartan (COZAAR) tablet 100 mg  100 mg Oral Daily Barrett, Rhonda G, PA-C   100 mg at 02/09/18 0951  . nitroGLYCERIN (NITROSTAT) SL tablet 0.4 mg  0.4 mg Sublingual Q5 Min x 3 PRN Belva Crome, MD      .  ondansetron (ZOFRAN) injection 4 mg  4 mg Intravenous Q6H PRN Belva Crome, MD   4 mg at 02/03/18 2258  . pantoprazole (PROTONIX) EC tablet 40 mg  40 mg Oral Daily Belva Crome, MD   40 mg at 02/09/18 0950  . polyethylene glycol (MIRALAX / GLYCOLAX) packet 17 g  17 g Oral Daily Arnoldo Lenis, MD   17 g at 02/08/18 4196  . sodium chloride flush (NS) 0.9 % injection 3 mL  3 mL Intravenous Q12H Belva Crome, MD   3 mL at 02/06/18 2257  . sodium chloride flush (NS) 0.9 % injection 3 mL  3 mL Intravenous PRN Belva Crome, MD      . sodium chloride flush (NS) 0.9 % injection 3 mL  3 mL Intravenous Q12H Belva Crome, MD   3 mL at 02/06/18 0916  . sodium chloride flush (NS) 0.9 % injection 3 mL  3 mL Intravenous PRN Belva Crome, MD      . vitamin B-12 (CYANOCOBALAMIN) tablet  1,000 mcg  1,000 mcg Oral Daily Belva Crome, MD   1,000 mcg at 02/09/18 0950  . zolpidem (AMBIEN) tablet 5 mg  5 mg Oral QHS PRN Belva Crome, MD         Discharge Medications: Please see discharge summary for a list of discharge medications.  Relevant Imaging Results:  Relevant Lab Results:   Additional Information SS# [id number]  Carolin Sicks, [place]

## 2018-02-09 NOTE — Care Management Important Message (Signed)
Important Message  Patient Details  Name: Frank Elliott MRN: [id number] Date of Birth: May 10, 1926   Medicare Important Message Given:  Yes    [name] P [place] 02/09/2018, 2:09 PM
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
## 2018-02-10 LAB — CREATININE, SERUM
Creatinine, Ser: 1.29 mg/dL — ABNORMAL HIGH (ref 0.61–1.24)
GFR calc non Af Amer: 47 mL/min — ABNORMAL LOW (ref >60)
GFR, EST AFRICAN AMERICAN: 55 mL/min — AB (ref >60)
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
## 2018-02-11 ENCOUNTER — Encounter (HOSPITAL_COMMUNITY): Payer: Self-pay | Admitting: Cardiology

## 2018-02-11 DIAGNOSIS — Z950 Presence of cardiac pacemaker: Secondary | ICD-10-CM | POA: Diagnosis not present

## 2018-02-11 DIAGNOSIS — I5042 Chronic combined systolic (congestive) and diastolic (congestive) heart failure: Secondary | ICD-10-CM | POA: Diagnosis not present

## 2018-02-11 DIAGNOSIS — I509 Heart failure, unspecified: Secondary | ICD-10-CM | POA: Diagnosis not present

## 2018-02-11 DIAGNOSIS — I10 Essential (primary) hypertension: Secondary | ICD-10-CM | POA: Diagnosis not present

## 2018-02-11 DIAGNOSIS — K219 Gastro-esophageal reflux disease without esophagitis: Secondary | ICD-10-CM | POA: Diagnosis not present

## 2018-02-11 DIAGNOSIS — Z955 Presence of coronary angioplasty implant and graft: Secondary | ICD-10-CM | POA: Diagnosis not present

## 2018-02-11 DIAGNOSIS — R112 Nausea with vomiting, unspecified: Secondary | ICD-10-CM | POA: Diagnosis not present

## 2018-02-11 DIAGNOSIS — Z48812 Encounter for surgical aftercare following surgery on the circulatory system: Secondary | ICD-10-CM | POA: Diagnosis not present

## 2018-02-11 DIAGNOSIS — R279 Unspecified lack of coordination: Secondary | ICD-10-CM | POA: Diagnosis not present

## 2018-02-11 DIAGNOSIS — I441 Atrioventricular block, second degree: Secondary | ICD-10-CM | POA: Diagnosis not present

## 2018-02-11 DIAGNOSIS — Z743 Need for continuous supervision: Secondary | ICD-10-CM | POA: Diagnosis not present

## 2018-02-11 DIAGNOSIS — R55 Syncope and collapse: Secondary | ICD-10-CM | POA: Diagnosis not present

## 2018-02-11 DIAGNOSIS — J449 Chronic obstructive pulmonary disease, unspecified: Secondary | ICD-10-CM | POA: Diagnosis not present

## 2018-02-11 DIAGNOSIS — I110 Hypertensive heart disease with heart failure: Secondary | ICD-10-CM | POA: Diagnosis not present

## 2018-02-11 DIAGNOSIS — G629 Polyneuropathy, unspecified: Secondary | ICD-10-CM | POA: Diagnosis not present

## 2018-02-11 DIAGNOSIS — N400 Enlarged prostate without lower urinary tract symptoms: Secondary | ICD-10-CM | POA: Diagnosis not present

## 2018-02-11 LAB — BASIC METABOLIC PANEL
Anion gap: 8 (ref 5–15)
BUN: 28 mg/dL — AB (ref 8–23)
CALCIUM: 8.4 mg/dL — AB (ref 8.9–10.3)
CO2: 24 mmol/L (ref 22–32)
Chloride: 103 mmol/L (ref 98–111)
Creatinine, Ser: 1.17 mg/dL (ref 0.61–1.24)
GFR calc Af Amer: >60 mL/min (ref >60)
GFR calc non Af Amer: 53 mL/min — ABNORMAL LOW (ref >60)
GLUCOSE: 107 mg/dL — AB (ref 70–99)
POTASSIUM: 4.6 mmol/L (ref 3.5–5.1)
Sodium: 135 mmol/L (ref 135–145)

## 2018-02-11 LAB — MAGNESIUM: Magnesium: 2.3 mg/dL (ref 1.7–2.4)

## 2018-02-11 NOTE — Progress Notes (Signed)
[facility] will receive discharge information when they get patient. RN called [facility] for report.
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
## 2018-02-11 NOTE — Progress Notes (Signed)
Pt had 11 beat run of vtach. Pt asymptomatic and resting comfortably. Will continue to monitor. MD on call notified.

## 2018-02-11 NOTE — Clinical Social Work Placement (Signed)
   CLINICAL SOCIAL WORK PLACEMENT  NOTE  Date:  02/11/2018  Patient Details  Name: KALIJAH ZEISS MRN: [id number] Date of Birth: 24-Jun-1925  Clinical Social Work is seeking post-discharge placement for this patient at the [place] level of care (*CSW will initial, date and re-position this form in  chart as items are completed):  Yes   Patient/family provided with [facility] Work Department's list of facilities offering this level of care within the geographic area requested by the patient (or if unable, by the patient's family).  Yes   Patient/family informed of their freedom to choose among providers that offer the needed level of care, that participate in Medicare, Medicaid or managed care program needed by the patient, have an available bed and are willing to accept the patient.  Yes   Patient/family informed of [facility]'s ownership interest in [facility] and [facility], as well as of the fact that they are under no obligation to receive care at these facilities.  PASRR submitted to EDS on 02/09/18     PASRR number received on       Existing PASRR number confirmed on       FL2 transmitted to all facilities in geographic area requested by pt/family on       FL2 transmitted to all facilities within larger geographic area on 02/09/18     Patient informed that his/her managed care company has contracts with or will negotiate with certain facilities, including the following:        Yes   Patient/family informed of bed offers received.  Patient chooses bed at [facility] recommends and patient chooses bed at      Patient to be transferred to [facility] at [place] on 02/11/18.  Patient to be transferred to facility by [facility]     Patient family notified on 02/11/18 of transfer.  Name of family member notified:  Claudine Mouton     PHYSICIAN Please prepare prescriptions     Additional Comment:     _______________________________________________ Candie Chroman, LCSW 02/11/2018, 1:54 PM

## 2018-02-11 NOTE — Clinical Social Work Note (Signed)
CSW facilitated patient discharge including contacting patient family and facility to confirm patient discharge plans. Clinical information faxed to facility and family agreeable with plan. CSW arranged ambulance transport via [facility] to [facility] [place]. RN to call report prior to discharge [phone] Room 7001-A).  CSW will sign off for now as social work intervention is no longer needed. Please consult [place] again if new needs arise.  Dayton Scrape, [place]
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
## 2018-02-13 DIAGNOSIS — R112 Nausea with vomiting, unspecified: Secondary | ICD-10-CM | POA: Diagnosis not present

## 2018-02-13 DIAGNOSIS — I10 Essential (primary) hypertension: Secondary | ICD-10-CM | POA: Diagnosis not present

## 2018-02-13 DIAGNOSIS — I441 Atrioventricular block, second degree: Secondary | ICD-10-CM | POA: Diagnosis not present

## 2018-02-19 ENCOUNTER — Ambulatory Visit: Payer: Medicare Other

## 2018-02-19 ENCOUNTER — Ambulatory Visit: Payer: Medicare Other | Admitting: Cardiology

## 2018-02-25 ENCOUNTER — Ambulatory Visit (INDEPENDENT_AMBULATORY_CARE_PROVIDER_SITE_OTHER): Payer: Medicare Other | Admitting: *Deleted

## 2018-02-25 DIAGNOSIS — I5022 Chronic systolic (congestive) heart failure: Secondary | ICD-10-CM

## 2018-02-25 DIAGNOSIS — I441 Atrioventricular block, second degree: Secondary | ICD-10-CM | POA: Diagnosis not present

## 2018-02-25 DIAGNOSIS — Z950 Presence of cardiac pacemaker: Secondary | ICD-10-CM

## 2018-02-25 LAB — CUP PACEART INCLINIC DEVICE CHECK
Brady Statistic RV Percent Paced: 96 %
Implantable Lead Location: 753859
Implantable Lead Location: 753860
Implantable Pulse Generator Implant Date: 20190927
Lead Channel Impedance Value: 512.5 Ohm
Lead Channel Pacing Threshold Amplitude: 0.5 V
Lead Channel Pacing Threshold Amplitude: 0.5 V
Lead Channel Pacing Threshold Amplitude: 0.75 V
Lead Channel Pacing Threshold Pulse Width: 0.5 ms
Lead Channel Pacing Threshold Pulse Width: 0.5 ms
Lead Channel Sensing Intrinsic Amplitude: 1.9 mV
Lead Channel Sensing Intrinsic Amplitude: 11.9 mV
Lead Channel Setting Pacing Amplitude: 3.5 V
Lead Channel Setting Pacing Amplitude: 3.5 V
Lead Channel Setting Pacing Pulse Width: 0.5 ms
MDC IDC LEAD IMPLANT DT: 20190927
MDC IDC LEAD IMPLANT DT: 20190927
MDC IDC MSMT BATTERY REMAINING LONGEVITY: 81 mo
MDC IDC MSMT BATTERY VOLTAGE: 3.04 V
MDC IDC MSMT LEADCHNL RA PACING THRESHOLD AMPLITUDE: 0.75 V
MDC IDC MSMT LEADCHNL RA PACING THRESHOLD PULSEWIDTH: 0.5 ms
MDC IDC MSMT LEADCHNL RV IMPEDANCE VALUE: 587.5 Ohm
MDC IDC MSMT LEADCHNL RV PACING THRESHOLD PULSEWIDTH: 0.5 ms
MDC IDC PG SERIAL: 9066149
MDC IDC SESS DTM: 20191016152809
MDC IDC SET LEADCHNL RV SENSING SENSITIVITY: 2 mV
MDC IDC STAT BRADY RA PERCENT PACED: 5 %

## 2018-02-25 NOTE — Progress Notes (Signed)
Wound check appointment. Steri-strips removed. Wound without redness or edema. Incision edges approximated, wound well healed. Normal device function. Thresholds, sensing, and impedances consistent with implant measurements. Device programmed at 3.5V for extra safety margin until 3 month visit. Histogram distribution appropriate for patient and level of activity. 7 mode switches- longest 12sec, AT. No high ventricular rates noted. Patient educated about wound care, arm mobility, lifting restrictions. Son present for all education, written directions about wound care, physical restrictions and monitor given in facility paperwork. ROV with WC 05/25/18.

## 2018-03-03 ENCOUNTER — Other Ambulatory Visit: Payer: Self-pay

## 2018-03-04 ENCOUNTER — Telehealth: Payer: Self-pay | Admitting: Interventional Cardiology

## 2018-03-04 DIAGNOSIS — N400 Enlarged prostate without lower urinary tract symptoms: Secondary | ICD-10-CM | POA: Diagnosis not present

## 2018-03-04 DIAGNOSIS — J449 Chronic obstructive pulmonary disease, unspecified: Secondary | ICD-10-CM | POA: Diagnosis not present

## 2018-03-04 DIAGNOSIS — Z48812 Encounter for surgical aftercare following surgery on the circulatory system: Secondary | ICD-10-CM | POA: Diagnosis not present

## 2018-03-04 DIAGNOSIS — Z950 Presence of cardiac pacemaker: Secondary | ICD-10-CM | POA: Diagnosis not present

## 2018-03-04 DIAGNOSIS — I110 Hypertensive heart disease with heart failure: Secondary | ICD-10-CM | POA: Diagnosis not present

## 2018-03-04 DIAGNOSIS — I441 Atrioventricular block, second degree: Secondary | ICD-10-CM | POA: Diagnosis not present

## 2018-03-04 DIAGNOSIS — I5032 Chronic diastolic (congestive) heart failure: Secondary | ICD-10-CM | POA: Diagnosis not present

## 2018-03-04 NOTE — Telephone Encounter (Signed)
Returned sons call. Advised to call PCP to discuss diarrhea/nausea/Zofran. Son is agreeable to discussing w/ PCP.  He states that [facility] staff advised to contact our office to discuss hydrating patient.  He would like to know what they can hydrate pt w/ safely - gatorade/pedialyte/just water They are giving pt Ensure but states he doesn't think this is for hydration.  States that he was instructed to get advisement from our office on hydration "because of pt's heart and electrolytes can affect things".   He understands I will forward to Dr. Thompson Caul nurse, Anderson Malta, to follow up with them tomorrow on recommendation.

## 2018-03-04 NOTE — Telephone Encounter (Signed)
New message  Pt c/o medication issue:  1. Name of Medication: zofran  2. How are you currently taking this medication (dosage and times per day)? Every 8 hours as needed  3. Are you having a reaction (difficulty breathing--STAT)? no  4. What is your medication issue? Patient's son states that his father is having diarrhea and nausea. Would like to know how to keep patient from being dehydrated. Also would like to increase the dosage of Zofran. Would like to know if a prescription can be called in for this medication. Please advise.

## 2018-03-05 ENCOUNTER — Other Ambulatory Visit: Payer: Self-pay

## 2018-03-05 ENCOUNTER — Emergency Department (HOSPITAL_COMMUNITY): Payer: Medicare Other

## 2018-03-05 ENCOUNTER — Encounter (HOSPITAL_COMMUNITY): Payer: Self-pay | Admitting: Emergency Medicine

## 2018-03-05 ENCOUNTER — Ambulatory Visit: Payer: Medicare Other | Admitting: Cardiology

## 2018-03-05 ENCOUNTER — Inpatient Hospital Stay (HOSPITAL_COMMUNITY)
Admission: EM | Admit: 2018-03-05 | Discharge: 2018-03-08 | DRG: 392 | Disposition: A | Discharge status: Home / Self Care | Payer: Medicare Other | Attending: Internal Medicine | Admitting: Internal Medicine

## 2018-03-05 DIAGNOSIS — N183 Chronic kidney disease, stage 3 unspecified: Secondary | ICD-10-CM | POA: Diagnosis present

## 2018-03-05 DIAGNOSIS — I5042 Chronic combined systolic (congestive) and diastolic (congestive) heart failure: Secondary | ICD-10-CM | POA: Diagnosis present

## 2018-03-05 DIAGNOSIS — I472 Ventricular tachycardia: Secondary | ICD-10-CM | POA: Diagnosis present

## 2018-03-05 DIAGNOSIS — I10 Essential (primary) hypertension: Secondary | ICD-10-CM | POA: Diagnosis not present

## 2018-03-05 DIAGNOSIS — J449 Chronic obstructive pulmonary disease, unspecified: Secondary | ICD-10-CM | POA: Diagnosis present

## 2018-03-05 DIAGNOSIS — Z880 Allergy status to penicillin: Secondary | ICD-10-CM

## 2018-03-05 DIAGNOSIS — K219 Gastro-esophageal reflux disease without esophagitis: Secondary | ICD-10-CM | POA: Diagnosis present

## 2018-03-05 DIAGNOSIS — I5032 Chronic diastolic (congestive) heart failure: Secondary | ICD-10-CM | POA: Diagnosis present

## 2018-03-05 DIAGNOSIS — Z87891 Personal history of nicotine dependence: Secondary | ICD-10-CM

## 2018-03-05 DIAGNOSIS — Z85828 Personal history of other malignant neoplasm of skin: Secondary | ICD-10-CM

## 2018-03-05 DIAGNOSIS — N179 Acute kidney failure, unspecified: Secondary | ICD-10-CM | POA: Diagnosis not present

## 2018-03-05 DIAGNOSIS — N400 Enlarged prostate without lower urinary tract symptoms: Secondary | ICD-10-CM | POA: Diagnosis present

## 2018-03-05 DIAGNOSIS — Z91048 Other nonmedicinal substance allergy status: Secondary | ICD-10-CM

## 2018-03-05 DIAGNOSIS — A09 Infectious gastroenteritis and colitis, unspecified: Principal | ICD-10-CM | POA: Diagnosis present

## 2018-03-05 DIAGNOSIS — I441 Atrioventricular block, second degree: Secondary | ICD-10-CM

## 2018-03-05 DIAGNOSIS — G629 Polyneuropathy, unspecified: Secondary | ICD-10-CM | POA: Diagnosis present

## 2018-03-05 DIAGNOSIS — Z888 Allergy status to other drugs, medicaments and biological substances status: Secondary | ICD-10-CM | POA: Diagnosis not present

## 2018-03-05 DIAGNOSIS — R1030 Lower abdominal pain, unspecified: Secondary | ICD-10-CM | POA: Diagnosis not present

## 2018-03-05 DIAGNOSIS — Z882 Allergy status to sulfonamides status: Secondary | ICD-10-CM | POA: Diagnosis not present

## 2018-03-05 DIAGNOSIS — Z950 Presence of cardiac pacemaker: Secondary | ICD-10-CM | POA: Diagnosis not present

## 2018-03-05 DIAGNOSIS — I130 Hypertensive heart and chronic kidney disease with heart failure and stage 1 through stage 4 chronic kidney disease, or unspecified chronic kidney disease: Secondary | ICD-10-CM | POA: Diagnosis present

## 2018-03-05 DIAGNOSIS — Z96641 Presence of right artificial hip joint: Secondary | ICD-10-CM | POA: Diagnosis present

## 2018-03-05 DIAGNOSIS — R112 Nausea with vomiting, unspecified: Secondary | ICD-10-CM

## 2018-03-05 DIAGNOSIS — R109 Unspecified abdominal pain: Secondary | ICD-10-CM

## 2018-03-05 DIAGNOSIS — Z886 Allergy status to analgesic agent status: Secondary | ICD-10-CM | POA: Diagnosis not present

## 2018-03-05 DIAGNOSIS — K529 Noninfective gastroenteritis and colitis, unspecified: Secondary | ICD-10-CM

## 2018-03-05 DIAGNOSIS — R197 Diarrhea, unspecified: Secondary | ICD-10-CM

## 2018-03-05 DIAGNOSIS — K5190 Ulcerative colitis, unspecified, without complications: Secondary | ICD-10-CM | POA: Diagnosis not present

## 2018-03-05 DIAGNOSIS — R1110 Vomiting, unspecified: Secondary | ICD-10-CM | POA: Diagnosis not present

## 2018-03-05 LAB — C DIFFICILE QUICK SCREEN W PCR REFLEX
C DIFFICILE (CDIFF) TOXIN: NEGATIVE
C Diff antigen: NEGATIVE
C Diff interpretation: NOT DETECTED

## 2018-03-05 LAB — CBC
HEMATOCRIT: 39 % (ref 39.0–52.0)
Hemoglobin: 12.8 g/dL — ABNORMAL LOW (ref 13.0–17.0)
MCH: 33.7 pg (ref 26.0–34.0)
MCHC: 32.8 g/dL (ref 30.0–36.0)
MCV: 102.6 fL — ABNORMAL HIGH (ref 80.0–100.0)
PLATELETS: 338 10*3/uL (ref 150–400)
RBC: 3.8 MIL/uL — ABNORMAL LOW (ref 4.22–5.81)
RDW: 12.8 % (ref 11.5–15.5)
WBC: 9.5 10*3/uL (ref 4.0–10.5)
nRBC: 0 % (ref 0.0–0.2)

## 2018-03-05 LAB — COMPREHENSIVE METABOLIC PANEL
ALBUMIN: 3.2 g/dL — AB (ref 3.5–5.0)
ALT: 13 U/L (ref 0–44)
AST: 17 U/L (ref 15–41)
Alkaline Phosphatase: 105 U/L (ref 38–126)
Anion gap: 5 (ref 5–15)
BILIRUBIN TOTAL: 0.7 mg/dL (ref 0.3–1.2)
BUN: 27 mg/dL — AB (ref 8–23)
CHLORIDE: 107 mmol/L (ref 98–111)
CO2: 26 mmol/L (ref 22–32)
CREATININE: 1.36 mg/dL — AB (ref 0.61–1.24)
Calcium: 9.1 mg/dL (ref 8.9–10.3)
GFR, EST AFRICAN AMERICAN: 52 mL/min — AB (ref >60)
GFR, EST NON AFRICAN AMERICAN: 44 mL/min — AB (ref >60)
Glucose, Bld: 134 mg/dL — ABNORMAL HIGH (ref 70–99)
Potassium: 4.1 mmol/L (ref 3.5–5.1)
SODIUM: 138 mmol/L (ref 135–145)
Total Protein: 6.1 g/dL — ABNORMAL LOW (ref 6.5–8.1)

## 2018-03-05 LAB — LIPASE, BLOOD: Lipase: 26 U/L (ref 11–51)

## 2018-03-05 MED ORDER — IOHEXOL 300 MG/ML  SOLN
75.0000 mL | Freq: Once | INTRAMUSCULAR | Status: AC | PRN
  Administered 2018-03-05: 75 mL via INTRAVENOUS

## 2018-03-05 MED ORDER — ACETAMINOPHEN 325 MG PO TABS
650.0000 mg | ORAL_TABLET | Freq: Four times a day (QID) | ORAL | Status: DC | PRN
  Administered 2018-03-06 – 2018-03-07 (×2): 650 mg via ORAL
  Filled 2018-03-05 (×2): qty 2

## 2018-03-05 MED ORDER — FLUTICASONE PROPIONATE 50 MCG/ACT NA SUSP
1.0000 | Freq: Every day | NASAL | Status: DC | PRN
  Filled 2018-03-05: qty 16

## 2018-03-05 MED ORDER — SODIUM CHLORIDE 0.9 % IV SOLN
INTRAVENOUS | Status: AC
  Administered 2018-03-05: 12:00:00 via INTRAVENOUS

## 2018-03-05 MED ORDER — IOHEXOL 300 MG/ML  SOLN: 75.0000 mL | Freq: Once | INTRAMUSCULAR | Status: DC

## 2018-03-05 MED ORDER — FAMOTIDINE IN NACL 20-0.9 MG/50ML-% IV SOLN
20.0000 mg | INTRAVENOUS | Status: AC
  Administered 2018-03-05: 20 mg via INTRAVENOUS
  Filled 2018-03-05: qty 50

## 2018-03-05 MED ORDER — CARVEDILOL 6.25 MG PO TABS
6.2500 mg | ORAL_TABLET | Freq: Two times a day (BID) | ORAL | Status: DC
  Administered 2018-03-05 – 2018-03-08 (×6): 6.25 mg via ORAL
  Filled 2018-03-05 (×6): qty 1

## 2018-03-05 MED ORDER — PANTOPRAZOLE SODIUM 40 MG PO TBEC
40.0000 mg | DELAYED_RELEASE_TABLET | Freq: Every day | ORAL | Status: DC
  Administered 2018-03-05 – 2018-03-08 (×4): 40 mg via ORAL
  Filled 2018-03-05 (×4): qty 1

## 2018-03-05 MED ORDER — ACETAMINOPHEN 650 MG RE SUPP: 650.0000 mg | Freq: Four times a day (QID) | RECTAL | Status: DC | PRN

## 2018-03-05 MED ORDER — SODIUM CHLORIDE 0.9 % IV BOLUS
500.0000 mL | Freq: Once | INTRAVENOUS | Status: AC
  Administered 2018-03-05: 500 mL via INTRAVENOUS

## 2018-03-05 MED ORDER — FENTANYL CITRATE (PF) 100 MCG/2ML IJ SOLN
50.0000 ug | Freq: Once | INTRAMUSCULAR | Status: AC
  Administered 2018-03-05: 50 ug via INTRAVENOUS
  Filled 2018-03-05: qty 2

## 2018-03-05 MED ORDER — TRAMADOL HCL 50 MG PO TABS: 50.0000 mg | ORAL_TABLET | Freq: Four times a day (QID) | ORAL | Status: DC | PRN

## 2018-03-05 MED ORDER — METRONIDAZOLE IN NACL 5-0.79 MG/ML-% IV SOLN
500.0000 mg | Freq: Three times a day (TID) | INTRAVENOUS | Status: DC
  Administered 2018-03-05 – 2018-03-06 (×3): 500 mg via INTRAVENOUS
  Filled 2018-03-05 (×4): qty 100

## 2018-03-05 MED ORDER — PROMETHAZINE HCL 25 MG/ML IJ SOLN
12.5000 mg | Freq: Once | INTRAMUSCULAR | Status: AC
  Administered 2018-03-05: 12.5 mg via INTRAVENOUS
  Filled 2018-03-05: qty 1

## 2018-03-05 MED ORDER — ONDANSETRON HCL 4 MG PO TABS
4.0000 mg | ORAL_TABLET | Freq: Four times a day (QID) | ORAL | Status: DC | PRN
  Filled 2018-03-05: qty 1

## 2018-03-05 MED ORDER — HEPARIN SODIUM (PORCINE) 5000 UNIT/ML IJ SOLN
5000.0000 [IU] | Freq: Three times a day (TID) | INTRAMUSCULAR | Status: DC
  Administered 2018-03-05 – 2018-03-08 (×9): 5000 [IU] via SUBCUTANEOUS
  Filled 2018-03-05 (×9): qty 1

## 2018-03-05 MED ORDER — GABAPENTIN 300 MG PO CAPS
300.0000 mg | ORAL_CAPSULE | Freq: Three times a day (TID) | ORAL | Status: DC
  Administered 2018-03-05 – 2018-03-08 (×9): 300 mg via ORAL
  Filled 2018-03-05 (×9): qty 1

## 2018-03-05 MED ORDER — SODIUM CHLORIDE 0.9 % IV SOLN
2.0000 g | INTRAVENOUS | Status: DC
  Administered 2018-03-05 – 2018-03-07 (×3): 2 g via INTRAVENOUS
  Filled 2018-03-05 (×4): qty 20

## 2018-03-05 MED ORDER — LOSARTAN POTASSIUM 50 MG PO TABS
100.0000 mg | ORAL_TABLET | Freq: Every day | ORAL | Status: DC
  Administered 2018-03-05 – 2018-03-08 (×4): 100 mg via ORAL
  Filled 2018-03-05 (×4): qty 2

## 2018-03-05 MED ORDER — ONDANSETRON HCL 4 MG/2ML IJ SOLN
4.0000 mg | Freq: Once | INTRAMUSCULAR | Status: AC
  Administered 2018-03-05: 4 mg via INTRAVENOUS
  Filled 2018-03-05: qty 2

## 2018-03-05 MED ORDER — ONDANSETRON HCL 4 MG/2ML IJ SOLN
4.0000 mg | Freq: Four times a day (QID) | INTRAMUSCULAR | Status: DC | PRN
  Administered 2018-03-07: 4 mg via INTRAVENOUS
  Filled 2018-03-05 (×2): qty 2

## 2018-03-05 NOTE — ED Notes (Signed)
Enteric precautions initiated and maintained.

## 2018-03-05 NOTE — Progress Notes (Signed)
Received call from CCMD that pt had 9 beat run of Vtach. Assessed pt, asymptomatic and resting with heart rate NSR. Notified Bodenheimer, NP and will continue to monitor and assess.

## 2018-03-05 NOTE — ED Provider Notes (Signed)
[place] EMERGENCY DEPARTMENT Provider Note   CSN: [id number] Arrival date & time: 03/05/18  0113     History   Chief Complaint Chief Complaint  Patient presents with  . Emesis  . Diarrhea    HPI Frank Elliott is a 84 y.o. male.   84 y/o male with history of esophageal reflux, hypertension, chronic diastolic heart failure (LVEF 40-45%), Mobitz type II AV block status post pacemaker placement 1 month ago presents to the emergency department for evaluation of nausea area.  He had onset of diarrhea 5 days ago on the day he left from cardiac rehab.  Diarrhea has been watery, nonbloody.  Son reports approximately 8-10 episodes of diarrhea today.  He has developed nausea as well as dry heaves for the past 3 days.  He has not had any blood in his emesis.  He has not had any symptomatic improvement with Zofran.  He last received this medication at 2030 tonight.  Reports some intermittent sharp lower abdominal pain.  No fevers, urinary symptoms.  Reports history of cholecystectomy, appendectomy.  The history is provided by the patient. No language interpreter was used.  Emesis   Associated symptoms include diarrhea.  Diarrhea   Associated symptoms include vomiting.    Past Medical History:  Diagnosis Date  . Arthritis    "just about all over" (02/05/2018)  . AV block, Mobitz 2 02/03/2018   PPM placed 01/2018  . BPH (benign prostatic hyperplasia)   . Chronic diastolic (congestive) heart failure ([place]) 2017  . Complication of anesthesia    "had hard time waking up a long time ago" (02/05/2018)  . Cyst of kidney, acquired    "recently found; ? side" (02/05/2018)  . Dyspnea   . GERD (gastroesophageal reflux disease)   . History of hiatal hernia   . Hypertension   . Neuropathy   . Pleural effusion    dx 04/01/12  . Skin cancer    "face, nose" (02/05/2018)  . Syncope 02/03/2018    Patient Active Problem List   Diagnosis Date Noted  . Pacemaker 02/11/2018  .  Syncope 02/03/2018  . AV block, Mobitz 2 02/03/2018  . Bilateral lower extremity edema 12/19/2015  . Chronic systolic heart failure ([place]) 04/25/2013  . HTN (hypertension) 04/20/2012  . Chronic diastolic CHF (congestive heart failure) ([place]) 04/18/2012  . COPD (chronic obstructive pulmonary disease) ([place]) 04/18/2012    Past Surgical History:  Procedure Laterality Date  . APPENDECTOMY    . BACK SURGERY    . CARDIAC CATHETERIZATION  02/05/2018  . CATARACT EXTRACTION, BILATERAL Bilateral   . CHOLECYSTECTOMY    . JOINT REPLACEMENT    . LEFT HEART CATH AND CORONARY ANGIOGRAPHY N/A 02/05/2018   Procedure: LEFT HEART CATH AND CORONARY ANGIOGRAPHY;  Surgeon: Belva Crome, MD;  Location: [place] [facility];  Service: Cardiovascular;  Laterality: N/A;  . LUMBAR [place] SURGERY  1980s  . PACEMAKER IMPLANT N/A 02/06/2018   Procedure: PACEMAKER IMPLANT;  Surgeon: Constance Haw, MD;  Location: [place] [facility];  Service: Cardiovascular;  Laterality: N/A;  . SKIN CANCER EXCISION     "face, nose" (02/05/2018)  . TONSILLECTOMY    . TOTAL HIP ARTHROPLASTY Right 1990s/2000s  . ULTRASOUND GUIDANCE FOR VASCULAR ACCESS  02/05/2018   Procedure: Ultrasound Guidance For Vascular Access;  Surgeon: Belva Crome, MD;  Location: [place] [facility];  Service: Cardiovascular;;        Home Medications    Prior to Admission  medications   Medication Sig Start Date End Date Taking? Authorizing Provider  betamethasone dipropionate (DIPROLENE) 0.05 % cream Apply 1 application topically 2 (two) times daily as needed. unk 01/22/18   [provider]  carvedilol (COREG) 6.25 MG tablet Take 1 tablet (6.25 mg total) by mouth 2 (two) times daily with a meal. 02/08/18   [facility], Tami Lin, PA  Cyanocobalamin (VITAMIN B-12 PO) Take 1,000 mcg by mouth daily.     [provider]  fluticasone (FLONASE) 50 MCG/ACT nasal spray Place 1 spray into both nostrils daily as needed for allergies.  07/27/13    [provider]  gabapentin (NEURONTIN) 300 MG capsule Take 300 mg by mouth 3 (three) times daily.    [provider]  losartan (COZAAR) 100 MG tablet Take 1 tablet (100 mg total) by mouth daily. 01/22/18   Belva Crome, MD  omeprazole (PRILOSEC) 20 MG capsule Take 20 mg by mouth 2 (two) times daily.    [provider]  ondansetron (ZOFRAN) 4 MG tablet Take 4 mg by mouth every 8 (eight) hours as needed for nausea or vomiting.    [provider]  Probiotic Product (ALIGN PO) Take 1 capsule by mouth daily.    [provider]  promethazine (PHENERGAN) 25 MG tablet Take 1 tablet (25 mg total) by mouth every 8 (eight) hours as needed for nausea or vomiting. Patient not taking: Reported on 02/03/2018 06/02/16   [name], MD    Family History Family History  Problem Relation Age of Onset  . Heart disease Father     Social History Social History   Tobacco Use  . Smoking status: Former Research scientist (life sciences)  . Smokeless tobacco: Never Used  . Tobacco comment: "smoked & chewed  in teens & early 20's"  Substance Use Topics  . Alcohol use: Not Currently    Comment: "drank in teens & early 20's"  . Drug use: Never     Allergies   Aspirin; Whiskey [alcohol]; Amoxicillin; Avelox [moxifloxacin hcl in nacl]; Lisinopril; Penicillins; Sulfa antibiotics; Tape; and Zithromax [azithromycin]   Review of Systems Review of Systems  Gastrointestinal: Positive for diarrhea and vomiting.  Ten systems reviewed and are negative for acute change, except as noted in the HPI.    Physical Exam Updated Vital Signs BP (!) 130/55   Pulse 85   Temp 98.1 F (36.7 C) (Oral)   Resp 18   SpO2 97%   Physical Exam  Constitutional: He is oriented to person, place, and time. He appears well-developed and well-nourished. No distress.  Appears ill, but nontoxic. Active dry heaves.  HENT:  Head: Normocephalic and atraumatic.  Eyes: Conjunctivae and EOM are normal. No scleral  icterus.  Neck: Normal range of motion.  Cardiovascular: Normal rate, regular rhythm and intact distal pulses.  Pulmonary/Chest: Effort normal. No stridor. No respiratory distress. He has no wheezes.  Abdominal: Soft. He exhibits no mass. There is tenderness. There is no guarding.  Soft abdomen with TTP in the suprapubic abdomen and LLQ. No distension or peritoneal signs.  Musculoskeletal: Normal range of motion.  Neurological: He is alert and oriented to person, place, and time. He exhibits normal muscle tone. Coordination normal.  Skin: Skin is warm and dry. No rash noted. He is not diaphoretic. No erythema. No pallor.  Psychiatric: He has a normal mood and affect. His behavior is normal.  Nursing note and vitals reviewed.    ED Treatments / Results  Labs (all labs ordered are listed,  but only abnormal results are displayed) Labs Reviewed  COMPREHENSIVE METABOLIC PANEL - Abnormal; Notable for the following components:      Result Value   Glucose, Bld 134 (*)    BUN 27 (*)    Creatinine, Ser 1.36 (*)    Total Protein 6.1 (*)    Albumin 3.2 (*)    GFR calc non Af Amer 44 (*)    GFR calc Af Amer 52 (*)    All other components within normal limits  CBC - Abnormal; Notable for the following components:   RBC 3.80 (*)    Hemoglobin 12.8 (*)    MCV 102.6 (*)    All other components within normal limits  C DIFFICILE QUICK SCREEN W PCR REFLEX  LIPASE, BLOOD  URINALYSIS, ROUTINE W REFLEX MICROSCOPIC    EKG None  Radiology No results found.  Procedures Procedures (including critical care time)  Medications Ordered in ED Medications  iohexol (OMNIPAQUE) 300 MG/ML solution 75 mL (has no administration in time range)  iohexol (OMNIPAQUE) 300 MG/ML solution 75 mL (has no administration in time range)  sodium chloride 0.9 % bolus 500 mL (0 mLs Intravenous Stopped 03/05/18 0605)  ondansetron (ZOFRAN) injection 4 mg (4 mg Intravenous Given 03/05/18 0500)  famotidine (PEPCID)  IVPB 20 mg premix (0 mg Intravenous Stopped 03/05/18 0535)  fentaNYL (SUBLIMAZE) injection 50 mcg (50 mcg Intravenous Given 03/05/18 0546)  promethazine (PHENERGAN) injection 12.5 mg (12.5 mg Intravenous Given 03/05/18 0546)     Initial Impression / Assessment and Plan / ED Course  I have reviewed the triage vital signs and the nursing notes.  Pertinent labs & imaging results that were available during my care of the patient were reviewed by me and considered in my medical decision making (see chart for details).     84 year old male presents to the emergency department for complaints of nausea, vomiting, diarrhea.  Also noting lower abdominal pain which is sharp and intermittent.  Laboratory work-up without leukocytosis.  He has a mild AKI.  IV fluids initiated in the ED.  Patient with difficult symptomatic control of nausea.  He has been given Zofran, Pepcid, Phenergan.  Fentanyl given for pain.  Pending CT abdomen pelvis to further evaluate because of symptoms.  He also has a C. difficile test pending given recent discharge from rehab facility.  Patient signed out to Margarita Mail, PA-C at change of shift pending imaging results.  Anticipate admission for symptomatic control.   Final Clinical Impressions(s) / ED Diagnoses   Final diagnoses:  Nausea vomiting and diarrhea  Abdominal pain, unspecified abdominal location    ED Discharge Orders    None       Antonietta Breach, PA-C 03/05/18 [id number]    [name], MD 03/05/18 [phone]

## 2018-03-05 NOTE — H&P (Addendum)
History and Physical  LESHAWN Elliott QPY:[id number] DOB: 03/07/1926 DOA: 03/05/2018   PCP: [name], MD   Patient coming from:Home   Chief Complaint: worsening abdominal pain  HPI: ARIANA Elliott is a 84 y.o. male with medical history significant for combined CHF reduced EF with diastolic dysfunction, COPD, HTN, recent pacemaker placement ( 01/2018) for Mobitz block who presents on 03/05/2018 with 5 days of watery stool and lower abdominal pain. He tried taking imodium at his cardiac rehab earlier this week with no improvement. No recent travel or sick contacts. No fevers or chills. Did notice some bright red blood a day prior that he thinks is because of his hemorroids. Developed vomiting a day prior with no blood in emesis. Has not been on any recent antibiotics. Had tried to keep up with his hydration and eating. Came to ED as pain was not improving.    ED Course: In the ED was afebrile, normal oxygen saturation on room air, blood pressure 123/59 C. difficile negative in ED, lipase 26 creatinine at baseline, normalities and LFTs, hemoglobin 12.8 (baseline 13.6-14).  CT abdomen showed mild diffuse colitis with no evidence of abscess or other complication.  Patient was given IV Pepcid, IV fluid bolus and IV Phenergan and [facility] was called for admission.  Review of Systems:As mentioned in the history of present illness.Review of systems are otherwise negative Patient seen in the ED.   Past Medical History:  Diagnosis Date  . Arthritis    "just about all over" (02/05/2018)  . AV block, Mobitz 2 02/03/2018   PPM placed 01/2018  . BPH (benign prostatic hyperplasia)   . Chronic diastolic (congestive) heart failure ([place]) 2017  . Complication of anesthesia    "had hard time waking up a long time ago" (02/05/2018)  . Cyst of kidney, acquired    "recently found; ? side" (02/05/2018)  . Dyspnea   . GERD (gastroesophageal reflux disease)   . History of hiatal hernia   . Hypertension   .  Neuropathy   . Pleural effusion    dx 04/01/12  . Skin cancer    "face, nose" (02/05/2018)  . Syncope 02/03/2018   Past Surgical History:  Procedure Laterality Date  . APPENDECTOMY    . BACK SURGERY    . CARDIAC CATHETERIZATION  02/05/2018  . CATARACT EXTRACTION, BILATERAL Bilateral   . CHOLECYSTECTOMY    . JOINT REPLACEMENT    . LEFT HEART CATH AND CORONARY ANGIOGRAPHY N/A 02/05/2018   Procedure: LEFT HEART CATH AND CORONARY ANGIOGRAPHY;  Surgeon: Belva Crome, MD;  Location: [facility];  Service: Cardiovascular;  Laterality: N/A;  . LUMBAR [place] SURGERY  1980s  . PACEMAKER IMPLANT N/A 02/06/2018   Procedure: PACEMAKER IMPLANT;  Surgeon: Constance Haw, MD;  Location: [facility];  Service: Cardiovascular;  Laterality: N/A;  . SKIN CANCER EXCISION     "face, nose" (02/05/2018)  . TONSILLECTOMY    . TOTAL HIP ARTHROPLASTY Right 1990s/2000s  . ULTRASOUND GUIDANCE FOR VASCULAR ACCESS  02/05/2018   Procedure: Ultrasound Guidance For Vascular Access;  Surgeon: Belva Crome, MD;  Location: [facility];  Service: Cardiovascular;;   Allergies  Allergen Reactions  . Aspirin Anaphylaxis, Hives and Swelling  . Whiskey [Alcohol] Anaphylaxis and Hives  . Amoxicillin Other (See Comments)    Causes sore throat  . Avelox [Moxifloxacin Hcl In Nacl] Other (See Comments)    Caused diarrhea & constipation  . Lisinopril Cough  . Penicillins  Other (See Comments)    Unknown reaction  . Sulfa Antibiotics Hives  . Tape Itching    Also causes redness--Paper tape okay  . Zithromax [Azithromycin] Other (See Comments)    Sore throat   Social History:  reports that he has quit smoking. He has never used smokeless tobacco. He reports that he drank alcohol. He reports that he does not use drugs. Family History  Problem Relation Age of Onset  . Heart disease Father       Prior to Admission medications   Medication Sig Start Date End Date Taking? Authorizing Provider    betamethasone dipropionate (DIPROLENE) 0.05 % cream Apply 1 application topically 2 (two) times daily as needed. unk 01/22/18   [provider]  carvedilol (COREG) 6.25 MG tablet Take 1 tablet (6.25 mg total) by mouth 2 (two) times daily with a meal. 02/08/18   [facility], Tami Lin, PA  Cyanocobalamin (VITAMIN B-12 PO) Take 1,000 mcg by mouth daily.     [provider]  fluticasone (FLONASE) 50 MCG/ACT nasal spray Place 1 spray into both nostrils daily as needed for allergies.  07/27/13   [provider]  gabapentin (NEURONTIN) 300 MG capsule Take 300 mg by mouth 3 (three) times daily.    [provider]  losartan (COZAAR) 100 MG tablet Take 1 tablet (100 mg total) by mouth daily. 01/22/18   Belva Crome, MD  omeprazole (PRILOSEC) 20 MG capsule Take 20 mg by mouth 2 (two) times daily.    [provider]  ondansetron (ZOFRAN) 4 MG tablet Take 4 mg by mouth every 8 (eight) hours as needed for nausea or vomiting.    [provider]  Probiotic Product (ALIGN PO) Take 1 capsule by mouth daily.    [provider]  promethazine (PHENERGAN) 25 MG tablet Take 1 tablet (25 mg total) by mouth every 8 (eight) hours as needed for nausea or vomiting. Patient not taking: Reported on 02/03/2018 06/02/16   [name], MD    Physical Exam: BP (!) 149/66 (BP Location: Right Arm)   Pulse 92   Temp 99.5 F (37.5 C) (Oral)   Resp 20   Ht 5\' 10"  (1.778 m)   Wt 74.3 kg   SpO2 97%   BMI 23.50 kg/m   Constitutional normal appearing male Eyes: EOMI, anicteric, normal conjunctivae ENMT: Oropharynx with dry mucous membranes, normal dentition Cardiovascular: RRR no MRGs, with no peripheral edema Respiratory: Normal respiratory effort on room air, clear breath sounds on anterior chest field Abdomen: Soft, tender to palpation of lower abdomen, no rebound tenderness or guarding Skin: No rash ulcers, or lesions. Without skin tenting  Neurologic: Grossly no  focal neuro deficit. Psychiatric:Appropriate affect, and mood. Mental status AAOx3          Labs on Admission:  Basic Metabolic Panel: Recent Labs  Lab 03/05/18 0135  NA 138  K 4.1  CL 107  CO2 26  GLUCOSE 134*  BUN 27*  CREATININE 1.36*  CALCIUM 9.1   Liver Function Tests: Recent Labs  Lab 03/05/18 0135  AST 17  ALT 13  ALKPHOS 105  BILITOT 0.7  PROT 6.1*  ALBUMIN 3.2*   Recent Labs  Lab 03/05/18 0135  LIPASE 26   No results for input(s): AMMONIA in the last 168 hours. CBC: Recent Labs  Lab 03/05/18 0135  WBC 9.5  HGB 12.8*  HCT 39.0  MCV 102.6*  PLT 338   Cardiac Enzymes: No results for input(s): CKTOTAL, CKMB,  CKMBINDEX, TROPONINI in the last 168 hours.  BNP (last 3 results) No results for input(s): BNP in the last 8760 hours.  ProBNP (last 3 results) No results for input(s): PROBNP in the last 8760 hours.  CBG: No results for input(s): GLUCAP in the last 168 hours.  Radiological Exams on Admission: Ct Abdomen Pelvis W Contrast  Result Date: 03/05/2018 CLINICAL DATA:  Lower abdominal pain, vomiting, and diarrhea for 5 days. EXAM: CT ABDOMEN AND PELVIS WITH CONTRAST TECHNIQUE: Multidetector CT imaging of the abdomen and pelvis was performed using the standard protocol following bolus administration of intravenous contrast. CONTRAST:  53mL OMNIPAQUE IOHEXOL 300 MG/ML  SOLN COMPARISON:  02/03/2018 FINDINGS: Lower Chest: No acute findings. Hepatobiliary: No hepatic masses identified. Prior cholecystectomy. No evidence of biliary obstruction. Pancreas:  No mass or inflammatory changes. Spleen: Within normal limits in size and appearance. Adrenals/Urinary Tract: Small bilateral renal cysts noted. Right renal parenchymal scarring again seen. No evidence of hydronephrosis. Distal ureters and urinary bladder are partially obscured by artifact from right hip prosthesis. Stomach/Bowel: No evidence of bowel obstruction. Mild diffuse colonic wall thickening is  increased since previous study, consistent with colitis. No evidence of abscess. Vascular/Lymphatic: No pathologically enlarged lymph nodes. No abdominal aortic aneurysm. Aortic atherosclerosis. Reproductive: Enlarged prostate, with limited visualization due to artifact from right hip prosthesis. Other:  None. Musculoskeletal: No suspicious bone lesions identified. Old compression fracture deformity of L4 vertebral body is again seen. IMPRESSION: Mild diffuse colitis, likely infectious in etiology. No evidence of abscess or other complication. Mildly enlarged prostate. Electronically Signed   By: Earle Gell M.D.   On: 03/05/2018 07:36    Assessment/Plan Present on Admission: . Colitis . Chronic diastolic CHF (congestive heart failure) ([place]) . HTN (hypertension) . Pacemaker . AV block, Mobitz 2  Active Problems:   Chronic diastolic CHF (congestive heart failure) ([facility])   HTN (hypertension)   AV block, Mobitz 2   Pacemaker   Colitis   Acute Colitis, worsening. Not doing well with supportive care at home.  Needs IVF hydration, antibiotics and pain control.  Suspect infectious etiology.  Diffuse colitis demonstrated on CT abdomen with no evidence of abscess or other complications.  C diff negative -IV ceftriaxone (allergy to FQs and penicillins), IV flagyl -timed IVF, clear liquid diet, advance as tolerates - pain control (prn tyelonol mild, tramadol moderate), IVantiemetics  CHF with preserved EF (45-50%), euvolemic on exam. -daily weights -strict intake and output  Mobitz 2:1 block, status post pacemaker (placed on 02/06/2018). - Instructed to continue home Coreg twice daily as taken previously by cardiology -EKG in am - monitor on telemetry  CKD, Stage 3. Creatinine stable at baseline (1.3-1.5) -avoid nephrotoxins, strict intake and output - daily BMP - renal diet once able to tolerate  Neuropathy, unclear type, chronic. No history of diabetes - home gabapentin  HTN,  stable -Continue home losartan 100 mg  GERD, stable -Continue home PPI    DVT prophylaxis: heparin subcut  Code Status: FULL code, discussed on day of admission   Family Communication:  No family at bedside  Consults called: none   Admission status: Admitted as inpatient to telemetry expect > 2 midnight stay for IV antibiotics and fluids given severity of abdominal pain and decreased PO intake in patient with CHF with increased risk for exacerbation given need for hydration.      Desiree Hane MD [facility]  Pager [phone]  If 7PM-7AM, please contact night-coverage [email or web address] Password [email or web address]  03/05/2018, 8:01 PM

## 2018-03-05 NOTE — ED Notes (Signed)
Attempted IV x2. 

## 2018-03-05 NOTE — ED Triage Notes (Addendum)
Patient reports persistent emesis and diarrhea for 3 days with fatigue , denies fever or chills .

## 2018-03-05 NOTE — Telephone Encounter (Signed)
Pt now in hospital

## 2018-03-05 NOTE — ED Provider Notes (Signed)
7:19 AM BP (!) 130/55   Pulse 85   Temp 98.1 F (36.7 C) (Oral)   Resp 18   SpO2 97%  Patient taken in sign out from [place].  Expect admission for AKI/ sxs control for N/V/D x 5days Awaiting CT results- cdiff test pending   Patient with nausea vomiting diarrhea.  C. difficile test still pending.  T scan shows pan colitis.  Patient symptoms have stabilized for the time being.  He will be admitted for further symptom management.  Spoken with Dr. Lonny Prude who assumes care of the patient for admission.   Margarita Mail, PA-C 03/05/18 1541    Gareth Morgan, MD 03/06/18 [phone]

## 2018-03-06 LAB — BASIC METABOLIC PANEL
ANION GAP: 5 (ref 5–15)
BUN: 30 mg/dL — ABNORMAL HIGH (ref 8–23)
CHLORIDE: 111 mmol/L (ref 98–111)
CO2: 25 mmol/L (ref 22–32)
Calcium: 7.9 mg/dL — ABNORMAL LOW (ref 8.9–10.3)
Creatinine, Ser: 1.41 mg/dL — ABNORMAL HIGH (ref 0.61–1.24)
GFR calc Af Amer: 49 mL/min — ABNORMAL LOW (ref >60)
GFR calc non Af Amer: 43 mL/min — ABNORMAL LOW (ref >60)
Glucose, Bld: 96 mg/dL (ref 70–99)
POTASSIUM: 3.6 mmol/L (ref 3.5–5.1)
SODIUM: 141 mmol/L (ref 135–145)

## 2018-03-06 LAB — URINALYSIS, ROUTINE W REFLEX MICROSCOPIC
Bilirubin Urine: NEGATIVE
Glucose, UA: NEGATIVE mg/dL
HGB URINE DIPSTICK: NEGATIVE
Ketones, ur: NEGATIVE mg/dL
NITRITE: NEGATIVE
PROTEIN: NEGATIVE mg/dL
pH: 5 (ref 5.0–8.0)

## 2018-03-06 LAB — MAGNESIUM: MAGNESIUM: 1.8 mg/dL (ref 1.7–2.4)

## 2018-03-06 MED ORDER — RISAQUAD PO CAPS
1.0000 | ORAL_CAPSULE | Freq: Every day | ORAL | Status: DC
  Administered 2018-03-06 – 2018-03-08 (×3): 1 via ORAL
  Filled 2018-03-06 (×3): qty 1

## 2018-03-06 MED ORDER — MAGNESIUM SULFATE 2 GM/50ML IV SOLN
2.0000 g | Freq: Once | INTRAVENOUS | Status: AC
  Administered 2018-03-06: 2 g via INTRAVENOUS
  Filled 2018-03-06: qty 50

## 2018-03-06 MED ORDER — POTASSIUM CHLORIDE CRYS ER 20 MEQ PO TBCR
40.0000 meq | EXTENDED_RELEASE_TABLET | Freq: Once | ORAL | Status: AC
  Administered 2018-03-06: 40 meq via ORAL
  Filled 2018-03-06: qty 2

## 2018-03-06 MED ORDER — ALIGN 4 MG PO CAPS: ORAL_CAPSULE | Freq: Every day | ORAL | Status: DC

## 2018-03-06 NOTE — Progress Notes (Signed)
PROGRESS NOTE        PATIENT DETAILS Name: Frank Elliott Age: 84 y.o. Sex: male Date of Birth: 11/01/1925 Admit Date: 03/05/2018 Admitting Physician Desiree Hane, MD NOB:[id number], Annie Main, MD  Brief Narrative: Patient is a 84 y.o. male with history of combined systolic/diastolic heart failure (EF 45% by TTE on 02/04/2018) him a history of Mobitz type II heart block requiring PPM placement on 02/06/18 with few days history of abdominal pain with vomiting and diarrhea.  CT scan of the abdomen showed diffuse colitis-C. difficile PCR was negative-patient was started on antimicrobial therapy admitted to the hospitalist service.  See below for further details.  Subjective: Abdominal pain has improved-claims that diarrhea slowed down significantly-has not had a bowel movement overnight.  Assessment/Plan: Colitis: Likely infectious-C. difficile PCR negative, GI pathogen panel pending (however diarrhea markedly better)-overall improved-diarrhea seems to have slowed down significantly-abdominal pain has essentially resolved-continue Rocephin-we will discontinue Flagyl for now.  Advance diet slowly and follow clinical course.  Suspect if clinical improvement continues-could be discharged over the next few days.  Chronic combined systolic and diastolic heart failure: Compensated-follow volume status/weights closely.  Continue Coreg and losartan.  Not on any diuretics as an outpatient.  History of Mobitz type II requiring permanent pacemaker implantation on 02/06/2018: Paced rhythm on telemetry.  Follow.  NSVT: Check magnesium-keep potassium more than 4-monitoring telemetry.  Remains on beta-blocker.  Recent echocardiogram showed EF around 45-50%.    Hypertension: Controlled continue losartan and Coreg.  CKD stage III: Creatinine close to usual baseline-follow periodically  GERD: Continue PPI  DVT Prophylaxis: Prophylactic Heparin   Code Status: Full code   Family  Communication: None at bedside  Disposition Plan: Remain inpatient-hopefully home over the weekend-May require home health services-PT evaluation ordered.  Antimicrobial agents: Anti-infectives (From admission, onward)   Start     Dose/Rate Route Frequency Ordered Stop   03/05/18 1115  metroNIDAZOLE (FLAGYL) IVPB 500 mg  Status:  Discontinued     500 mg 100 mL/hr over 60 Minutes Intravenous Every 8 hours 03/05/18 1101 03/06/18 0918   03/05/18 1115  cefTRIAXone (ROCEPHIN) 2 g in sodium chloride 0.9 % 100 mL IVPB     2 g 200 mL/hr over 30 Minutes Intravenous Every 24 hours 03/05/18 1101        Procedures: None  CONSULTS:  None  Time spent: 25- minutes-Greater than 50% of this time was spent in counseling, explanation of diagnosis, planning of further management, and coordination of care.  MEDICATIONS: Scheduled Meds: . carvedilol  6.25 mg Oral BID WC  . gabapentin  300 mg Oral TID  . heparin  5,000 Units Subcutaneous Q8H  . iohexol  75 mL Intravenous Once  . losartan  100 mg Oral Daily  . pantoprazole  40 mg Oral Daily   Continuous Infusions: . cefTRIAXone (ROCEPHIN)  IV 2 g (03/06/18 1010)   PRN Meds:.acetaminophen **OR** acetaminophen, fluticasone, ondansetron **OR** ondansetron (ZOFRAN) IV, traMADol   PHYSICAL EXAM: Vital signs: Vitals:   03/05/18 1110 03/05/18 2130 03/06/18 0546 03/06/18 1010  BP: (!) 149/66 (!) 97/43 (!) 103/45 (!) 123/58  Pulse: 92 77 63 76  Resp: 20 17 17    Temp: 99.5 F (37.5 C) 99.1 F (37.3 C) 98.1 F (36.7 C)   TempSrc: Oral Oral Oral   SpO2: 97% 95% 93%   Weight: 74.3 kg  Height: 5\' 10"  (1.778 m)      Filed Weights   03/05/18 1110  Weight: 74.3 kg   Body mass index is 23.5 kg/m.   General appearance :Awake, alert, not in any distress. Speech Clear.  Eyes:, pupils equally reactive to light and accomodation,no scleral icterus. HEENT: Atraumatic and Normocephalic Neck: supple, no JVD. No cervical lymphadenopathy.    Resp:Good air entry bilaterally, no added sounds  CVS: S1 S2 regular, no murmurs.  GI: Bowel sounds present, Non tender and not distended Extremities: B/L Lower Ext shows no edema, both legs are warm to touch Neurology:  speech clear, generalized weakness-but nonfocal Musculoskeletal:No digital cyanosis Skin:No Rash, warm and dry Wounds:N/A  I have personally reviewed following labs and imaging studies  LABORATORY DATA: CBC: Recent Labs  Lab 03/05/18 0135 03/06/18 0428  WBC 9.5 7.2  HGB 12.8* 10.2*  HCT 39.0 30.1*  MCV 102.6* 103.8*  PLT 338 671    Basic Metabolic Panel: Recent Labs  Lab 03/05/18 0135 03/06/18 0428  NA 138 141  K 4.1 3.6  CL 107 111  CO2 26 25  GLUCOSE 134* 96  BUN 27* 30*  CREATININE 1.36* 1.41*  CALCIUM 9.1 7.9*    GFR: Estimated Creatinine Clearance: 36.7 mL/min (A) (by C-G formula based on SCr of 1.41 mg/dL (H)).  Liver Function Tests: Recent Labs  Lab 03/05/18 0135  AST 17  ALT 13  ALKPHOS 105  BILITOT 0.7  PROT 6.1*  ALBUMIN 3.2*   Recent Labs  Lab 03/05/18 0135  LIPASE 26   No results for input(s): AMMONIA in the last 168 hours.  Coagulation Profile: No results for input(s): INR, PROTIME in the last 168 hours.  Cardiac Enzymes: No results for input(s): CKTOTAL, CKMB, CKMBINDEX, TROPONINI in the last 168 hours.  BNP (last 3 results) No results for input(s): PROBNP in the last 8760 hours.  HbA1C: No results for input(s): HGBA1C in the last 72 hours.  CBG: No results for input(s): GLUCAP in the last 168 hours.  Lipid Profile: No results for input(s): CHOL, HDL, LDLCALC, TRIG, CHOLHDL, LDLDIRECT in the last 72 hours.  Thyroid Function Tests: No results for input(s): TSH, T4TOTAL, FREET4, T3FREE, THYROIDAB in the last 72 hours.  Anemia Panel: No results for input(s): VITAMINB12, FOLATE, FERRITIN, TIBC, IRON, RETICCTPCT in the last 72 hours.  Urine analysis:    Component Value Date/Time   COLORURINE AMBER (A)  03/06/2018 0459   APPEARANCEUR HAZY (A) 03/06/2018 0459   LABSPEC >1.046 (H) 03/06/2018 0459   PHURINE 5.0 03/06/2018 0459   GLUCOSEU NEGATIVE 03/06/2018 0459   HGBUR NEGATIVE 03/06/2018 [place] 03/06/2018 [place] 03/06/2018 0459   PROTEINUR NEGATIVE 03/06/2018 0459   UROBILINOGEN 1.0 07/21/2014 2125   NITRITE NEGATIVE 03/06/2018 0459   LEUKOCYTESUR TRACE (A) 03/06/2018 0459    Sepsis Labs: Lactic Acid, Venous No results found for: LATICACIDVEN  MICROBIOLOGY: Recent Results (from the past 240 hour(s))  C difficile quick scan w PCR reflex     Status: None   Collection Time: 03/05/18 11:08 AM  Result Value Ref Range Status   C Diff antigen NEGATIVE NEGATIVE Final   C Diff toxin NEGATIVE NEGATIVE Final   C Diff interpretation No C. difficile detected.  Final    Comment: Performed at [facility], [place] [address], [place], [place] [address]    RADIOLOGY STUDIES/RESULTS: Dg Chest 2 View  Result Date: 02/07/2018 CLINICAL DATA:  Cardiac device in situ. EXAM:  CHEST - 2 VIEW COMPARISON:  CT chest 02/03/2018 and chest radiograph 07/21/2014. FINDINGS: Trachea is midline. Heart is enlarged. Thoracic aorta is calcified and somewhat uncoiled. Pacemaker lead tips are in the right atrium and right ventricle. Minimal left basilar subsegmental atelectasis or scarring. Lungs are otherwise clear. No pleural fluid. Degenerative changes in the shoulders. IMPRESSION: 1. No pneumothorax after pacemaker insertion. 2. No acute findings. 3.  Aortic atherosclerosis (ICD10-170.0). Electronically Signed   By: Lorin Picket M.D.   On: 02/07/2018 10:01   Ct Abdomen Pelvis W Contrast  Result Date: 03/05/2018 CLINICAL DATA:  Lower abdominal pain, vomiting, and diarrhea for 5 days. EXAM: CT ABDOMEN AND PELVIS WITH CONTRAST TECHNIQUE: Multidetector CT imaging of the abdomen and pelvis was performed using the standard protocol following bolus administration of intravenous  contrast. CONTRAST:  26mL OMNIPAQUE IOHEXOL 300 MG/ML  SOLN COMPARISON:  02/03/2018 FINDINGS: Lower Chest: No acute findings. Hepatobiliary: No hepatic masses identified. Prior cholecystectomy. No evidence of biliary obstruction. Pancreas:  No mass or inflammatory changes. Spleen: Within normal limits in size and appearance. Adrenals/Urinary Tract: Small bilateral renal cysts noted. Right renal parenchymal scarring again seen. No evidence of hydronephrosis. Distal ureters and urinary bladder are partially obscured by artifact from right hip prosthesis. Stomach/Bowel: No evidence of bowel obstruction. Mild diffuse colonic wall thickening is increased since previous study, consistent with colitis. No evidence of abscess. Vascular/Lymphatic: No pathologically enlarged lymph nodes. No abdominal aortic aneurysm. Aortic atherosclerosis. Reproductive: Enlarged prostate, with limited visualization due to artifact from right hip prosthesis. Other:  None. Musculoskeletal: No suspicious bone lesions identified. Old compression fracture deformity of L4 vertebral body is again seen. IMPRESSION: Mild diffuse colitis, likely infectious in etiology. No evidence of abscess or other complication. Mildly enlarged prostate. Electronically Signed   By: Earle Gell M.D.   On: 03/05/2018 07:36     LOS: 1 day   Oren Binet, MD  [facility]  If 7PM-7AM, please contact night-coverage  Please page via [email or web address] [email or web address]-click on MD name and type text message  03/06/2018, 11:39 AM

## 2018-03-07 LAB — CBC
HEMATOCRIT: 30.1 % — AB (ref 39.0–52.0)
HEMATOCRIT: 32.6 % — AB (ref 39.0–52.0)
HEMOGLOBIN: 10.2 g/dL — AB (ref 13.0–17.0)
HEMOGLOBIN: 10.9 g/dL — AB (ref 13.0–17.0)
MCH: 35.2 pg — ABNORMAL HIGH (ref 26.0–34.0)
MCH: 34 pg (ref 26.0–34.0)
MCHC: 33.9 g/dL (ref 30.0–36.0)
MCHC: 33.4 g/dL (ref 30.0–36.0)
MCV: 103.8 fL — ABNORMAL HIGH (ref 80.0–100.0)
MCV: 101.6 fL — AB (ref 80.0–100.0)
Platelets: 228 10*3/uL (ref 150–400)
Platelets: 250 10*3/uL (ref 150–400)
RBC: 2.9 MIL/uL — AB (ref 4.22–5.81)
RBC: 3.21 MIL/uL — ABNORMAL LOW (ref 4.22–5.81)
RDW: 13.2 % (ref 11.5–15.5)
RDW: 13 % (ref 11.5–15.5)
WBC: 7.2 10*3/uL (ref 4.0–10.5)
WBC: 6.7 10*3/uL (ref 4.0–10.5)
nRBC: 0 % (ref 0.0–0.2)
nRBC: 0 % (ref 0.0–0.2)

## 2018-03-07 LAB — BASIC METABOLIC PANEL
Anion gap: 4 — ABNORMAL LOW (ref 5–15)
BUN: 24 mg/dL — AB (ref 8–23)
CHLORIDE: 111 mmol/L (ref 98–111)
CO2: 24 mmol/L (ref 22–32)
CREATININE: 1.14 mg/dL (ref 0.61–1.24)
Calcium: 8.2 mg/dL — ABNORMAL LOW (ref 8.9–10.3)
GFR calc Af Amer: >60 mL/min (ref >60)
GFR calc non Af Amer: 55 mL/min — ABNORMAL LOW (ref >60)
Glucose, Bld: 101 mg/dL — ABNORMAL HIGH (ref 70–99)
POTASSIUM: 4.3 mmol/L (ref 3.5–5.1)
Sodium: 139 mmol/L (ref 135–145)

## 2018-03-07 LAB — MAGNESIUM: Magnesium: 2.1 mg/dL (ref 1.7–2.4)

## 2018-03-07 NOTE — Progress Notes (Signed)
PROGRESS NOTE        PATIENT DETAILS Name: Frank Elliott Age: 84 y.o. Sex: male Date of Birth: 1926/03/05 Admit Date: 03/05/2018 Admitting Physician Desiree Hane, MD [id number], Annie Main, MD  Brief Narrative:  Patient is a 84 y.o. male with history of combined systolic/diastolic heart failure (EF 45% by TTE on 02/04/2018) him a history of Mobitz type II heart block requiring PPM placement on 02/06/18 with few days history of abdominal pain with vomiting and diarrhea.  CT scan of the abdomen showed diffuse colitis-C. difficile PCR was negative-patient was started on antimicrobial therapy admitted to the hospitalist service.  See below for further details.  Subjective:  Patient in bed, appears comfortable, denies any headache, no fever, no chest pain or pressure, no shortness of breath , no abdominal pain, improved diarrhea. No focal weakness.  Assessment/Plan:  Colitis: Diarrhea, infectious colitis, negative for C. Difficile, diarrhea is much improved, pain-free, currently on Rocephin, encourage diet and activity.  If better likely discharge tomorrow.  Chronic combined systolic and diastolic heart failure: Compensated-follow volume status/weights closely.  Continue Coreg and losartan.  Not on any diuretics as an outpatient.  History of Mobitz type II requiring permanent pacemaker implantation on 02/06/2018: Paced rhythm on telemetry.  Follow.  NSVT: Check magnesium-keep potassium more than 4-monitoring telemetry.  Remains on beta-blocker.  Recent echocardiogram showed EF around 45-50%.    Hypertension: Controlled continue losartan and Coreg.  CKD stage III: Creatinine close to usual baseline-follow periodically  GERD: Continue PPI   DVT Prophylaxis: Prophylactic Heparin   Code Status: Full code   Family Communication: None at bedside  Disposition Plan: Remain inpatient-hopefully home over the weekend-May require home health services-PT evaluation  ordered.  Antimicrobial agents: Anti-infectives (From admission, onward)   Start     Dose/Rate Route Frequency Ordered Stop   03/05/18 1115  metroNIDAZOLE (FLAGYL) IVPB 500 mg  Status:  Discontinued     500 mg 100 mL/hr over 60 Minutes Intravenous Every 8 hours 03/05/18 1101 03/06/18 0918   03/05/18 1115  cefTRIAXone (ROCEPHIN) 2 g in sodium chloride 0.9 % 100 mL IVPB     2 g 200 mL/hr over 30 Minutes Intravenous Every 24 hours 03/05/18 1101        Procedures: None  CONSULTS:  None  Time spent: 25- minutes-Greater than 50% of this time was spent in counseling, explanation of diagnosis, planning of further management, and coordination of care.  MEDICATIONS: Scheduled Meds: . acidophilus  1 capsule Oral Daily  . carvedilol  6.25 mg Oral BID WC  . gabapentin  300 mg Oral TID  . heparin  5,000 Units Subcutaneous Q8H  . iohexol  75 mL Intravenous Once  . losartan  100 mg Oral Daily  . pantoprazole  40 mg Oral Daily   Continuous Infusions: . cefTRIAXone (ROCEPHIN)  IV 2 g (03/06/18 1010)   PRN Meds:.acetaminophen **OR** acetaminophen, fluticasone, ondansetron **OR** ondansetron (ZOFRAN) IV, traMADol   PHYSICAL EXAM: Vital signs: Vitals:   03/06/18 1327 03/06/18 1613 03/06/18 2230 03/07/18 0536  BP: (!) 112/58 135/64 (!) 117/53 (!) 137/53  Pulse: 65 73 67 64  Resp: 18  18 17   Temp: 98.9 F (37.2 C)  98.3 F (36.8 C) 98.1 F (36.7 C)  TempSrc: Oral  Oral Oral  SpO2: 97%  96% 97%  Weight:      Height:  Filed Weights   03/05/18 1110  Weight: 74.3 kg   Body mass index is 23.5 kg/m.   Exam  Awake Alert, Oriented X 3, No new F.N deficits, Normal affect [place].AT,PERRAL Supple Neck,No JVD, No cervical lymphadenopathy appriciated.  Symmetrical Chest wall movement, Good air movement bilaterally, CTAB RRR,No Gallops, Rubs or new Murmurs, No Parasternal Heave +ve B.Sounds, Abd Soft, No tenderness, No organomegaly appriciated, No rebound - guarding or rigidity. No  Cyanosis, Clubbing or edema, No new Rash or bruise   I have personally reviewed following labs and imaging studies  LABORATORY DATA: CBC: Recent Labs  Lab 03/05/18 0135 03/06/18 0428 03/07/18 0444  WBC 9.5 7.2 6.7  HGB 12.8* 10.2* 10.9*  HCT 39.0 30.1* 32.6*  MCV 102.6* 103.8* 101.6*  PLT 338 228 400    Basic Metabolic Panel: Recent Labs  Lab 03/05/18 0135 03/06/18 0428 03/07/18 0444  NA 138 141 139  K 4.1 3.6 4.3  CL 107 111 111  CO2 26 25 24   GLUCOSE 134* 96 101*  BUN 27* 30* 24*  CREATININE 1.36* 1.41* 1.14  CALCIUM 9.1 7.9* 8.2*  MG  --  1.8 2.1    GFR: Estimated Creatinine Clearance: 45.4 mL/min (by C-G formula based on SCr of 1.14 mg/dL).  Liver Function Tests: Recent Labs  Lab 03/05/18 0135  AST 17  ALT 13  ALKPHOS 105  BILITOT 0.7  PROT 6.1*  ALBUMIN 3.2*   Recent Labs  Lab 03/05/18 0135  LIPASE 26   No results for input(s): AMMONIA in the last 168 hours.  Coagulation Profile: No results for input(s): INR, PROTIME in the last 168 hours.  Cardiac Enzymes: No results for input(s): CKTOTAL, CKMB, CKMBINDEX, TROPONINI in the last 168 hours.  BNP (last 3 results) No results for input(s): PROBNP in the last 8760 hours.  HbA1C: No results for input(s): HGBA1C in the last 72 hours.  CBG: No results for input(s): GLUCAP in the last 168 hours.  Lipid Profile: No results for input(s): CHOL, HDL, LDLCALC, TRIG, CHOLHDL, LDLDIRECT in the last 72 hours.  Thyroid Function Tests: No results for input(s): TSH, T4TOTAL, FREET4, T3FREE, THYROIDAB in the last 72 hours.  Anemia Panel: No results for input(s): VITAMINB12, FOLATE, FERRITIN, TIBC, IRON, RETICCTPCT in the last 72 hours.  Urine analysis:    Component Value Date/Time   COLORURINE AMBER (A) 03/06/2018 0459   APPEARANCEUR HAZY (A) 03/06/2018 0459   LABSPEC >1.046 (H) 03/06/2018 0459   PHURINE 5.0 03/06/2018 0459   GLUCOSEU NEGATIVE 03/06/2018 0459   HGBUR NEGATIVE 03/06/2018 [place] 03/06/2018 [place] 03/06/2018 0459   PROTEINUR NEGATIVE 03/06/2018 0459   UROBILINOGEN 1.0 07/21/2014 2125   NITRITE NEGATIVE 03/06/2018 0459   LEUKOCYTESUR TRACE (A) 03/06/2018 0459    Sepsis Labs: Lactic Acid, Venous No results found for: LATICACIDVEN  MICROBIOLOGY: Recent Results (from the past 240 hour(s))  C difficile quick scan w PCR reflex     Status: None   Collection Time: 03/05/18 11:08 AM  Result Value Ref Range Status   C Diff antigen NEGATIVE NEGATIVE Final   C Diff toxin NEGATIVE NEGATIVE Final   C Diff interpretation No C. difficile detected.  Final    Comment: Performed at [facility], [place] [address] [place], [place] [address]    RADIOLOGY STUDIES/RESULTS: Dg Chest 2 View  Result Date: 02/07/2018 CLINICAL DATA:  Cardiac device in situ. EXAM: CHEST - 2 VIEW COMPARISON:  CT chest 02/03/2018  and chest radiograph 07/21/2014. FINDINGS: Trachea is midline. Heart is enlarged. Thoracic aorta is calcified and somewhat uncoiled. Pacemaker lead tips are in the right atrium and right ventricle. Minimal left basilar subsegmental atelectasis or scarring. Lungs are otherwise clear. No pleural fluid. Degenerative changes in the shoulders. IMPRESSION: 1. No pneumothorax after pacemaker insertion. 2. No acute findings. 3.  Aortic atherosclerosis (ICD10-170.0). Electronically Signed   By: Lorin Picket M.D.   On: 02/07/2018 10:01   Ct Abdomen Pelvis W Contrast  Result Date: 03/05/2018 CLINICAL DATA:  Lower abdominal pain, vomiting, and diarrhea for 5 days. EXAM: CT ABDOMEN AND PELVIS WITH CONTRAST TECHNIQUE: Multidetector CT imaging of the abdomen and pelvis was performed using the standard protocol following bolus administration of intravenous contrast. CONTRAST:  28mL OMNIPAQUE IOHEXOL 300 MG/ML  SOLN COMPARISON:  02/03/2018 FINDINGS: Lower Chest: No acute findings. Hepatobiliary: No hepatic masses identified. Prior cholecystectomy. No  evidence of biliary obstruction. Pancreas:  No mass or inflammatory changes. Spleen: Within normal limits in size and appearance. Adrenals/Urinary Tract: Small bilateral renal cysts noted. Right renal parenchymal scarring again seen. No evidence of hydronephrosis. Distal ureters and urinary bladder are partially obscured by artifact from right hip prosthesis. Stomach/Bowel: No evidence of bowel obstruction. Mild diffuse colonic wall thickening is increased since previous study, consistent with colitis. No evidence of abscess. Vascular/Lymphatic: No pathologically enlarged lymph nodes. No abdominal aortic aneurysm. Aortic atherosclerosis. Reproductive: Enlarged prostate, with limited visualization due to artifact from right hip prosthesis. Other:  None. Musculoskeletal: No suspicious bone lesions identified. Old compression fracture deformity of L4 vertebral body is again seen. IMPRESSION: Mild diffuse colitis, likely infectious in etiology. No evidence of abscess or other complication. Mildly enlarged prostate. Electronically Signed   By: Earle Gell M.D.   On: 03/05/2018 07:36     LOS: 2 days   Signature  Lala Lund M.D on 03/07/2018 at 9:59 AM   -  To page go to [email or web address] - password [facility]

## 2018-03-07 NOTE — Evaluation (Signed)
Physical Therapy Evaluation Patient Details Name: Frank Elliott MRN: [id number] DOB: 05-07-1926 Today's Date: 03/07/2018   History of Present Illness  Pt is a 84 y.o. M who presents with N/V, diarrhea and weakness. Found to have colitis. PMH include systolic CHF, pacemaker, chronic diastolic CHF, HTN.  Clinical Impression  Patient presents with generalized deconditioning, impaired balance and impaired mobility s/p above. Tolerated transfers and gait training with Min guard assist for balance/safety. Pt lives with son but is home alone most of the time and Mod I with RW. Reports no falls. Was working with HHPT per report. Encouraged ambulation while in the hospital with nursing. Will follow acutely to maximize independence and mobility prior to return home.     Follow Up Recommendations Home health PT;Supervision - Intermittent    Equipment Recommendations  None recommended by PT    Recommendations for Other Services       Precautions / Restrictions Precautions Precautions: Fall Restrictions Weight Bearing Restrictions: No      Mobility  Bed Mobility Overal bed mobility: Needs Assistance Bed Mobility: Supine to Sit     Supine to sit: Supervision;HOB elevated     General bed mobility comments: Increased time and use of momentum to get to EOB.  Transfers Overall transfer level: Needs assistance Equipment used: Rolling walker (2 wheeled) Transfers: Sit to/from Stand Sit to Stand: Min guard         General transfer comment: Min guard for safety. Cues for hand placement. Stood from [employer], transferred to chair post ambulation.  Ambulation/Gait Ambulation/Gait assistance: Min guard Gait Distance (Feet): 150 Feet Assistive device: Rolling walker (2 wheeled) Gait Pattern/deviations: Step-to pattern;Step-through pattern;Decreased stance time - left;Decreased step length - left;Festinating;Trunk flexed     General Gait Details: Slow, mildly unsteady gait with festinating  noted at times; decreased stance time LLE. Cues for RW proximity/managemet. HR stable. Furniture walking within room, RW for hallway.  Stairs            Wheelchair Mobility    Modified Rankin (Stroke Patients Only)       Balance Overall balance assessment: Needs assistance Sitting-balance support: Feet supported;No upper extremity supported Sitting balance-Leahy Scale: Good     Standing balance support: During functional activity Standing balance-Leahy Scale: Fair Standing balance comment: Able to stand and urinate without assist and wash hands at sink without LOB.                             Pertinent Vitals/Pain Pain Assessment: Faces Faces Pain Scale: Hurts little more Pain Location: bil feet Pain Descriptors / Indicators: Burning Pain Intervention(s): Monitored during session    Home Living Family/patient expects to be discharged to:: Private residence Living Arrangements: Children(son works.) Available Help at Discharge: Family;Available PRN/intermittently Type of Home: House Home Access: Level entry     Home Layout: One level Home Equipment: Shower seat;Wheelchair - manual;Cane - single point;Walker - 2 wheels      Prior Function Level of Independence: Independent with assistive device(s)         Comments: Uses RW for ambulation. Reports getting HHPT PTA.      Hand Dominance   Dominant Hand: Right    Extremity/Trunk Assessment   Upper Extremity Assessment Upper Extremity Assessment: Defer to OT evaluation    Lower Extremity Assessment Lower Extremity Assessment: LLE deficits/detail;RLE deficits/detail;Generalized weakness RLE Deficits / Details: tingling/burning foot-premorbid RLE Sensation: decreased light touch;history of peripheral neuropathy LLE Deficits /  Details: tingling/burning foot- premorbid LLE Sensation: decreased light touch;history of peripheral neuropathy    Cervical / Trunk Assessment Cervical / Trunk  Assessment: Kyphotic  Communication   Communication: HOH  Cognition Arousal/Alertness: Awake/alert Behavior During Therapy: WFL for tasks assessed/performed Overall Cognitive Status: Within Functional Limits for tasks assessed                                        General Comments      Exercises     Assessment/Plan    PT Assessment Patient needs continued PT services  PT Problem List Decreased strength;Decreased mobility;Pain;Decreased balance;Impaired sensation       PT Treatment Interventions DME instruction;Functional mobility training;Balance training;Patient/family education;Gait training;Therapeutic activities;Therapeutic exercise;Stair training    PT Goals (Current goals can be found in the Care Plan section)  Acute Rehab PT Goals Patient Stated Goal: to be able to move around PT Goal Formulation: With patient Time For Goal Achievement: 03/21/18 Potential to Achieve Goals: Good    Frequency Min 3X/week   Barriers to discharge Decreased caregiver support son works during the day    Co-evaluation               AM-PAC PT "6 Clicks" Daily Activity  Outcome Measure Difficulty turning over in bed (including adjusting bedclothes, sheets and blankets)?: A Little Difficulty moving from lying on back to sitting on the side of the bed? : A Little Difficulty sitting down on and standing up from a chair with arms (e.g., wheelchair, bedside commode, etc,.)?: None Help needed moving to and from a bed to chair (including a wheelchair)?: A Little Help needed walking in hospital room?: A Little Help needed climbing 3-5 steps with a railing? : A Little 6 Click Score: 19    End of Session Equipment Utilized During Treatment: Gait belt Activity Tolerance: Patient tolerated treatment well Patient left: in chair;with call bell/phone within reach;with chair alarm set Nurse Communication: Mobility status PT Visit Diagnosis: Unsteadiness on feet  (R26.81);Muscle weakness (generalized) (M62.81);Difficulty in walking, not elsewhere classified (R26.2)    Time: 2010-0712 PT Time Calculation (min) (ACUTE ONLY): 26 min   Charges:   PT Evaluation $PT Eval Moderate Complexity: 1 Mod PT Treatments $Gait Training: 8-22 mins        Wray Kearns, [place], DPT Acute Rehabilitation Services Pager [phone] Office [place] 03/07/2018, 12:18 PM

## 2018-03-08 LAB — BASIC METABOLIC PANEL
Anion gap: 6 (ref 5–15)
BUN: 17 mg/dL (ref 8–23)
CHLORIDE: 109 mmol/L (ref 98–111)
CO2: 24 mmol/L (ref 22–32)
Calcium: 8.3 mg/dL — ABNORMAL LOW (ref 8.9–10.3)
Creatinine, Ser: 1.16 mg/dL (ref 0.61–1.24)
GFR calc non Af Amer: 54 mL/min — ABNORMAL LOW (ref >60)
Glucose, Bld: 92 mg/dL (ref 70–99)
POTASSIUM: 4.1 mmol/L (ref 3.5–5.1)
SODIUM: 139 mmol/L (ref 135–145)

## 2018-03-08 LAB — CBC
HCT: 34.2 % — ABNORMAL LOW (ref 39.0–52.0)
HEMOGLOBIN: 11.4 g/dL — AB (ref 13.0–17.0)
MCH: 33.7 pg (ref 26.0–34.0)
MCHC: 33.3 g/dL (ref 30.0–36.0)
MCV: 101.2 fL — ABNORMAL HIGH (ref 80.0–100.0)
Platelets: 249 10*3/uL (ref 150–400)
RBC: 3.38 MIL/uL — ABNORMAL LOW (ref 4.22–5.81)
RDW: 12.4 % (ref 11.5–15.5)
WBC: 5.1 10*3/uL (ref 4.0–10.5)
nRBC: 0 % (ref 0.0–0.2)

## 2018-03-08 LAB — MAGNESIUM: MAGNESIUM: 2.1 mg/dL (ref 1.7–2.4)

## 2018-03-08 MED ORDER — CEFPODOXIME PROXETIL 200 MG PO TABS: 200.0000 mg | ORAL_TABLET | Freq: Two times a day (BID) | ORAL | 0 refills | Status: DC

## 2018-03-08 MED ORDER — ONDANSETRON HCL 4 MG PO TABS: 4.0000 mg | ORAL_TABLET | Freq: Three times a day (TID) | ORAL | 0 refills | Status: DC | PRN

## 2018-03-08 MED ORDER — LOPERAMIDE HCL 2 MG PO TABS: 2.0000 mg | ORAL_TABLET | Freq: Four times a day (QID) | ORAL | 0 refills | Status: DC | PRN

## 2018-03-08 NOTE — Discharge Summary (Signed)
Frank Elliott:[id number] DOB: 1926-01-11 DOA: 03/05/2018  PCP: [name], MD  Admit date: 03/05/2018  Discharge date: 03/08/2018  Admitted From: Home   Disposition:  Home   Recommendations for Outpatient Follow-up:   Follow up with PCP in 1-2 weeks  PCP Please obtain BMP/CBC, 2 view CXR in 1week,  (see Discharge instructions)   PCP Please follow up on the following pending results:    Home Health: PT,RN, Aide   Equipment/Devices: None  Consultations: Renal, GI Discharge Condition: Stable   CODE STATUS: Full   Diet Recommendation: Renal   Chief Complaint  Patient presents with  . Emesis  . Diarrhea     Brief history of present illness from the day of admission and additional interim summary    Patient is a 84 y.o. male with history of combined systolic/diastolic heart failure (EF 45% by TTE on 02/04/2018) him a history of Mobitz type II heart block requiring PPM placement on 02/06/18 with few days history of abdominal pain with vomiting and diarrhea.  CT scan of the abdomen showed diffuse colitis-C. difficile PCR was negative-patient was started on antimicrobial therapy admitted to the hospitalist service.  See below for further details.                                                                 Hospital Course    Colitis: Diarrhea, infectious colitis, negative for C. Difficile, diarrhea is much improved, pain-free, found it well to Rocephin and will be placed on 3 more days of oral Vantin.  Feels overall much better will be discharged home with his other home medications and PRN Imodium.  Follow with PCP.  If symptoms recur outpatient GI follow-up can be considered.  Chronic combined systolic and diastolic heart failure: Compensated-follow volume status/weights closely.  Continue Coreg and  losartan.  Not on any diuretics as an outpatient.  History of Mobitz type II requiring permanent pacemaker implantation on 02/06/2018: Paced rhythm on telemetry.  No acute issues.  NSVT: Check magnesium-keep potassium more than 4-monitoring telemetry.  Remains on beta-blocker.  Recent echocardiogram showed EF around 45-50%.    Hypertension: Controlled continue losartan and Coreg.  CKD stage III: Creatinine close to usual baseline-follow periodically  GERD: Continue PPI   Discharge diagnosis     Active Problems:   Chronic diastolic CHF (congestive heart failure) (HCC)   HTN (hypertension)   AV block, Mobitz 2   Pacemaker   Colitis   CKD (chronic kidney disease) stage 3, GFR 30-59 ml/min ([name])    Discharge instructions    Discharge Instructions    Diet - low sodium heart healthy   Complete by:  As directed    Discharge instructions   Complete by:  As directed    Follow with Primary MD [name], MD  in 7 days   Get CBC, CMP checked  by Primary MD  in 5-7 days    Activity: As tolerated with Full fall precautions use walker/cane & assistance as needed  Disposition Home    Diet: Heart Healthy    Special Instructions: If you have smoked or chewed Tobacco  in the last 2 yrs please stop smoking, stop any regular Alcohol  and or any Recreational drug use.  On your next visit with your primary care physician please Get Medicines reviewed and adjusted.  Please request your Prim.MD to go over all Hospital Tests and Procedure/Radiological results at the follow up, please get all Hospital records sent to your Prim MD by signing hospital release before you go home.  If you experience worsening of your admission symptoms, develop shortness of breath, life threatening emergency, suicidal or homicidal thoughts you must seek medical attention immediately by calling 911 or calling your MD immediately  if symptoms less severe.  You Must read complete instructions/literature  along with all the possible adverse reactions/side effects for all the Medicines you take and that have been prescribed to you. Take any new Medicines after you have completely understood and accpet all the possible adverse reactions/side effects.   Increase activity slowly   Complete by:  As directed       Discharge Medications   Allergies as of 03/08/2018      Reactions   Aspirin Anaphylaxis, Hives, Swelling   Whiskey [alcohol] Anaphylaxis, Hives   Amoxicillin Other (See Comments)   Causes sore throat   Avelox [moxifloxacin Hcl In Nacl] Other (See Comments)   Caused diarrhea & constipation   Lisinopril Cough   Penicillins Other (See Comments)   Unknown reaction   Sulfa Antibiotics Hives   Tape Itching   Also causes redness--Paper tape okay   Zithromax [azithromycin] Other (See Comments)   Sore throat      Medication List    TAKE these medications   ALIGN PO Take 1 capsule by mouth daily.   betamethasone dipropionate 0.05 % cream Commonly known as:  DIPROLENE Apply 1 application topically 2 (two) times daily as needed. unk   carvedilol 6.25 MG tablet Commonly known as:  COREG Take 1 tablet (6.25 mg total) by mouth 2 (two) times daily with a meal.   cefpodoxime 200 MG tablet Commonly known as:  VANTIN Take 1 tablet (200 mg total) by mouth 2 (two) times daily.   fluticasone 50 MCG/ACT nasal spray Commonly known as:  FLONASE Place 1 spray into both nostrils daily as needed for allergies.   gabapentin 300 MG capsule Commonly known as:  NEURONTIN Take 300 mg by mouth 3 (three) times daily.   loperamide 2 MG tablet Commonly known as:  IMODIUM A-D Take 1 tablet (2 mg total) by mouth 4 (four) times daily as needed for diarrhea or loose stools.   losartan 100 MG tablet Commonly known as:  COZAAR Take 1 tablet (100 mg total) by mouth daily.   omeprazole 20 MG capsule Commonly known as:  PRILOSEC Take 20 mg by mouth 2 (two) times daily.   ondansetron 4 MG  tablet Commonly known as:  ZOFRAN Take 4 mg by mouth every 8 (eight) hours as needed for nausea or vomiting.   VITAMIN B-12 PO Take 1,000 mcg by mouth daily.       Follow-up Information    [name], MD. Schedule an appointment as soon as possible for a visit in 1 week(s).   Specialty:  Family Medicine Contact information: [address] [place] [address] [phone]        Belva Crome, MD .   Specialty:  Cardiology Contact information: [phone] [place] [address] [phone]        Constance Haw, MD .   Specialty:  Cardiology Contact information: [place] [address] [phone]           Major procedures and Radiology Reports - PLEASE review detailed and final reports thoroughly  -        Dg Chest 2 View  Result Date: 02/07/2018 CLINICAL DATA:  Cardiac device in situ. EXAM: CHEST - 2 VIEW COMPARISON:  CT chest 02/03/2018 and chest radiograph 07/21/2014. FINDINGS: Trachea is midline. Heart is enlarged. Thoracic aorta is calcified and somewhat uncoiled. Pacemaker lead tips are in the right atrium and right ventricle. Minimal left basilar subsegmental atelectasis or scarring. Lungs are otherwise clear. No pleural fluid. Degenerative changes in the shoulders. IMPRESSION: 1. No pneumothorax after pacemaker insertion. 2. No acute findings. 3.  Aortic atherosclerosis (ICD10-170.0). Electronically Signed   By: Lorin Picket M.D.   On: 02/07/2018 10:01   Ct Abdomen Pelvis W Contrast  Result Date: 03/05/2018 CLINICAL DATA:  Lower abdominal pain, vomiting, and diarrhea for 5 days. EXAM: CT ABDOMEN AND PELVIS WITH CONTRAST TECHNIQUE: Multidetector CT imaging of the abdomen and pelvis was performed using the standard protocol following bolus administration of intravenous contrast. CONTRAST:  38mL OMNIPAQUE IOHEXOL 300 MG/ML  SOLN COMPARISON:  02/03/2018 FINDINGS: Lower Chest: No acute  findings. Hepatobiliary: No hepatic masses identified. Prior cholecystectomy. No evidence of biliary obstruction. Pancreas:  No mass or inflammatory changes. Spleen: Within normal limits in size and appearance. Adrenals/Urinary Tract: Small bilateral renal cysts noted. Right renal parenchymal scarring again seen. No evidence of hydronephrosis. Distal ureters and urinary bladder are partially obscured by artifact from right hip prosthesis. Stomach/Bowel: No evidence of bowel obstruction. Mild diffuse colonic wall thickening is increased since previous study, consistent with colitis. No evidence of abscess. Vascular/Lymphatic: No pathologically enlarged lymph nodes. No abdominal aortic aneurysm. Aortic atherosclerosis. Reproductive: Enlarged prostate, with limited visualization due to artifact from right hip prosthesis. Other:  None. Musculoskeletal: No suspicious bone lesions identified. Old compression fracture deformity of L4 vertebral body is again seen. IMPRESSION: Mild diffuse colitis, likely infectious in etiology. No evidence of abscess or other complication. Mildly enlarged prostate. Electronically Signed   By: Earle Gell M.D.   On: 03/05/2018 07:36    Micro Results    Recent Results (from the past 240 hour(s))  C difficile quick scan w PCR reflex     Status: None   Collection Time: 03/05/18 11:08 AM  Result Value Ref Range Status   C Diff antigen NEGATIVE NEGATIVE Final   C Diff toxin NEGATIVE NEGATIVE Final   C Diff interpretation No C. difficile detected.  Final    Comment: Performed at [facility], [place] [address], [place], [place] [address]    Today   Subjective    Frank Elliott today has no headache,no chest abdominal pain,no new weakness tingling or numbness, feels much better wants to go home today.    Objective   Blood pressure (!) 142/71, pulse 60, temperature 98.1 F (36.7 C), resp. rate 20, height 5\' 10"  (1.778 m), weight 74.6 kg, SpO2 98 %.   Intake/Output  Summary (Last 24 hours) at 03/08/2018 0819 Last data filed at 03/08/2018  7654 Gross per 24 hour  Intake 360 ml  Output 350 ml  Net 10 ml    Exam Awake Alert, Oriented x 3, No new F.N deficits, Normal affect Terry.AT,PERRAL Supple Neck,No JVD, No cervical lymphadenopathy appriciated.  Symmetrical Chest wall movement, Good air movement bilaterally, CTAB RRR,No Gallops,Rubs or new Murmurs, No Parasternal Heave +ve B.Sounds, Abd Soft, Non tender, No organomegaly appriciated, No rebound -guarding or rigidity. No Cyanosis, Clubbing or edema, No new Rash or bruise   Data Review   CBC w Diff:  Lab Results  Component Value Date   WBC 5.1 03/08/2018   HGB 11.4 (L) 03/08/2018   HCT 34.2 (L) 03/08/2018   PLT 249 03/08/2018   LYMPHOPCT 10 02/03/2018   MONOPCT 7 02/03/2018   EOSPCT 1 02/03/2018   BASOPCT 0 02/03/2018    CMP:  Lab Results  Component Value Date   NA 139 03/08/2018   K 4.1 03/08/2018   CL 109 03/08/2018   CO2 24 03/08/2018   BUN 17 03/08/2018   CREATININE 1.16 03/08/2018   PROT 6.1 (L) 03/05/2018   ALBUMIN 3.2 (L) 03/05/2018   BILITOT 0.7 03/05/2018   ALKPHOS 105 03/05/2018   AST 17 03/05/2018   ALT 13 03/05/2018  .   Total Time in preparing paper work, data evaluation and todays exam - 27 minutes  Lala Lund M.D on 03/08/2018 at 8:19 AM  [facility]   Office  [phone]

## 2018-03-08 NOTE — Progress Notes (Signed)
DC pt per WC. Rx x4 in packet with son. DC delayed because pt wanted to eat breakfast but trays were late and handed out past 1030.

## 2018-03-08 NOTE — Discharge Instructions (Signed)
Follow with Primary MD [name], MD in 7 days   Get CBC, CMP checked  by Primary MD  in 5-7 days    Activity: As tolerated with Full fall precautions use walker/cane & assistance as needed  Disposition Home    Diet: Heart Healthy    Special Instructions: If you have smoked or chewed Tobacco  in the last 2 yrs please stop smoking, stop any regular Alcohol  and or any Recreational drug use.  On your next visit with your primary care physician please Get Medicines reviewed and adjusted.  Please request your Prim.MD to go over all Hospital Tests and Procedure/Radiological results at the follow up, please get all Hospital records sent to your Prim MD by signing hospital release before you go home.  If you experience worsening of your admission symptoms, develop shortness of breath, life threatening emergency, suicidal or homicidal thoughts you must seek medical attention immediately by calling 911 or calling your MD immediately  if symptoms less severe.  You Must read complete instructions/literature along with all the possible adverse reactions/side effects for all the Medicines you take and that have been prescribed to you. Take any new Medicines after you have completely understood and accpet all the possible adverse reactions/side effects.

## 2018-03-08 NOTE — Care Management Note (Signed)
Case Management Note  Patient Details  Name: Frank Elliott MRN: [id number] Date of Birth: May 06, 1926  Subjective/Objective:                    Action/Plan:  Patient active w [facility] PTA, will resume services. Clinical liaison for [facility] notified. No other CM needs identified. Signing off.  Expected Discharge Date:  03/08/18               Expected Discharge Plan:  [place]  In-House Referral:     Discharge planning Services  CM Consult  Post Acute Care Choice:  Home Health, Resumption of Svcs/PTA Provider Choice offered to:     DME Arranged:    DME Agency:     HH Arranged:  RN, PT, Nurse's Aide [place] Agency:  [place]  Status of Service:  Completed, signed off  If discussed at [place] of Stay Meetings, dates discussed:    Additional Comments:  Carles Collet, RN 03/08/2018, 9:12 AM

## 2018-03-15 ENCOUNTER — Encounter

## 2018-03-19 DIAGNOSIS — J449 Chronic obstructive pulmonary disease, unspecified: Secondary | ICD-10-CM | POA: Diagnosis not present

## 2018-03-19 DIAGNOSIS — K529 Noninfective gastroenteritis and colitis, unspecified: Secondary | ICD-10-CM | POA: Diagnosis not present

## 2018-03-19 DIAGNOSIS — I5022 Chronic systolic (congestive) heart failure: Secondary | ICD-10-CM | POA: Diagnosis not present

## 2018-03-19 DIAGNOSIS — M4800 Spinal stenosis, site unspecified: Secondary | ICD-10-CM | POA: Diagnosis not present

## 2018-03-19 DIAGNOSIS — K219 Gastro-esophageal reflux disease without esophagitis: Secondary | ICD-10-CM | POA: Diagnosis not present

## 2018-03-19 DIAGNOSIS — F329 Major depressive disorder, single episode, unspecified: Secondary | ICD-10-CM | POA: Diagnosis not present

## 2018-03-19 DIAGNOSIS — I10 Essential (primary) hypertension: Secondary | ICD-10-CM | POA: Diagnosis not present

## 2018-03-19 DIAGNOSIS — Z0000 Encounter for general adult medical examination without abnormal findings: Secondary | ICD-10-CM | POA: Diagnosis not present

## 2018-03-19 DIAGNOSIS — N183 Chronic kidney disease, stage 3 (moderate): Secondary | ICD-10-CM | POA: Diagnosis not present

## 2018-03-20 DIAGNOSIS — I110 Hypertensive heart disease with heart failure: Secondary | ICD-10-CM | POA: Diagnosis not present

## 2018-03-20 DIAGNOSIS — I5032 Chronic diastolic (congestive) heart failure: Secondary | ICD-10-CM | POA: Diagnosis not present

## 2018-03-20 DIAGNOSIS — Z48812 Encounter for surgical aftercare following surgery on the circulatory system: Secondary | ICD-10-CM | POA: Diagnosis not present

## 2018-03-20 DIAGNOSIS — J449 Chronic obstructive pulmonary disease, unspecified: Secondary | ICD-10-CM | POA: Diagnosis not present

## 2018-03-20 DIAGNOSIS — I441 Atrioventricular block, second degree: Secondary | ICD-10-CM | POA: Diagnosis not present

## 2018-03-20 DIAGNOSIS — N400 Enlarged prostate without lower urinary tract symptoms: Secondary | ICD-10-CM | POA: Diagnosis not present
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
## 2018-03-23 ENCOUNTER — Ambulatory Visit (INDEPENDENT_AMBULATORY_CARE_PROVIDER_SITE_OTHER): Payer: Medicare Other | Admitting: Interventional Cardiology

## 2018-03-23 ENCOUNTER — Encounter: Payer: Self-pay | Admitting: Interventional Cardiology

## 2018-03-23 VITALS — BP 142/64 | HR 72 | Ht 70.0 in | Wt 164.8 lb

## 2018-03-23 DIAGNOSIS — I441 Atrioventricular block, second degree: Secondary | ICD-10-CM | POA: Diagnosis not present

## 2018-03-23 DIAGNOSIS — I10 Essential (primary) hypertension: Secondary | ICD-10-CM

## 2018-03-23 DIAGNOSIS — Z950 Presence of cardiac pacemaker: Secondary | ICD-10-CM | POA: Diagnosis not present

## 2018-03-23 DIAGNOSIS — I5032 Chronic diastolic (congestive) heart failure: Secondary | ICD-10-CM | POA: Diagnosis not present

## 2018-03-23 MED ORDER — LOSARTAN POTASSIUM 100 MG PO TABS: 100.0000 mg | ORAL_TABLET | Freq: Every day | ORAL | 3 refills | Status: DC

## 2018-03-23 NOTE — Patient Instructions (Signed)
Medication Instructions:  Your physician recommends that you continue on your current medications as directed. Please refer to the Current Medication list given to you today.  If you need a refill on your cardiac medications before your next appointment, please call your pharmacy.   Lab work: None ordered If you have labs (blood work) drawn today and your tests are completely normal, you will receive your results only by: Marland Kitchen MyChart Message (if you have MyChart) OR . A paper copy in the mail If you have any lab test that is abnormal or we need to change your treatment, we will call you to review the results.  Testing/Procedures: None ordered  Follow-Up: Keep appointment with Dr. Curt Bears on 05/25/18 at 9:30 AM.  At [facility], you and your health needs are our priority.  As part of our continuing mission to provide you with exceptional heart care, we have created designated Provider Care Teams.  These Care Teams include your primary Cardiologist (physician) and Advanced Practice Providers (APPs -  Physician Assistants and Nurse Practitioners) who all work together to provide you with the care you need, when you need it. . You will need a follow up appointment in 4-6 months.  Please call our office 2 months in advance to schedule this appointment.  You may see Sinclair Grooms, MD or one of the following Advanced Practice Providers on your designated Care Team:   . Truitt Merle, NP . Cecilie Kicks, NP . Kathyrn Drown, NP   Any Other Special Instructions Will Be Listed Below (If Applicable).

## 2018-05-25 ENCOUNTER — Encounter: Payer: Self-pay | Admitting: Cardiology

## 2018-05-25 ENCOUNTER — Encounter (INDEPENDENT_AMBULATORY_CARE_PROVIDER_SITE_OTHER): Payer: Self-pay

## 2018-05-25 ENCOUNTER — Ambulatory Visit (INDEPENDENT_AMBULATORY_CARE_PROVIDER_SITE_OTHER): Payer: Medicare Other | Admitting: Cardiology

## 2018-05-25 VITALS — BP 130/80 | HR 85 | Ht 70.0 in | Wt 162.0 lb

## 2018-05-25 DIAGNOSIS — I480 Paroxysmal atrial fibrillation: Secondary | ICD-10-CM

## 2018-05-25 DIAGNOSIS — I5032 Chronic diastolic (congestive) heart failure: Secondary | ICD-10-CM | POA: Diagnosis not present

## 2018-05-25 DIAGNOSIS — I441 Atrioventricular block, second degree: Secondary | ICD-10-CM | POA: Diagnosis not present

## 2018-05-25 DIAGNOSIS — I10 Essential (primary) hypertension: Secondary | ICD-10-CM | POA: Diagnosis not present

## 2018-05-25 MED ORDER — APIXABAN 5 MG PO TABS: 5.0000 mg | ORAL_TABLET | Freq: Two times a day (BID) | ORAL | 6 refills | Status: DC

## 2018-05-25 NOTE — Progress Notes (Signed)
Electrophysiology Office Note   Date:  05/25/2018   ID:  Frank Elliott, DOB 08/05/1925, MRN [id number]  PCP:  Frank Melter, MD  Cardiologist:  Frank Elliott Primary Electrophysiologist:  Frank Elliott Frank Leeds, MD    No chief complaint on file.    History of Present Illness: Frank Elliott is a 85 y.o. male who is being seen today for the evaluation of 2:1 AV block at the request of Frank Melter, MD. Presenting today for electrophysiology evaluation.  He has a significant history of chronic systolic heart failure, hypertension, COPD who presented to the hospital September 2019 with syncope and 2-1 AV block.  He is now status post [place] dual-chamber pacemaker implanted 02/06/2018.  Today, he denies symptoms of palpitations, chest pain, shortness of breath, orthopnea, PND, lower extremity edema, claudication, dizziness, presyncope, syncope, bleeding, or neurologic sequela. The patient is tolerating medications without difficulties.    Past Medical History:  Diagnosis Date  . Arthritis    "just about all over" (02/05/2018)  . AV block, [place] 2 02/03/2018   PPM placed 01/2018  . BPH (benign prostatic hyperplasia)   . Chronic diastolic (congestive) heart failure ([place]) 2017  . Complication of anesthesia    "had hard time waking up a long time ago" (02/05/2018)  . Cyst of kidney, acquired    "recently found; ? side" (02/05/2018)  . Dyspnea   . GERD (gastroesophageal reflux disease)   . History of hiatal hernia   . Hypertension   . Neuropathy   . Pleural effusion    dx 04/01/12  . Skin cancer    "face, nose" (02/05/2018)  . Syncope 02/03/2018   Past Surgical History:  Procedure Laterality Date  . APPENDECTOMY    . BACK SURGERY    . CARDIAC CATHETERIZATION  02/05/2018  . CATARACT EXTRACTION, BILATERAL Bilateral   . CHOLECYSTECTOMY    . JOINT REPLACEMENT    . LEFT HEART CATH AND CORONARY ANGIOGRAPHY N/A 02/05/2018   Procedure: LEFT HEART CATH AND CORONARY ANGIOGRAPHY;  Surgeon:  Belva Crome, MD;  Location: [place] [facility];  Service: Cardiovascular;  Laterality: N/A;  . LUMBAR [place] SURGERY  1980s  . PACEMAKER IMPLANT N/A 02/06/2018   Procedure: PACEMAKER IMPLANT;  Surgeon: Constance Haw, MD;  Location: [place] [facility];  Service: Cardiovascular;  Laterality: N/A;  . SKIN CANCER EXCISION     "face, nose" (02/05/2018)  . TONSILLECTOMY    . TOTAL HIP ARTHROPLASTY Right 1990s/2000s  . ULTRASOUND GUIDANCE FOR VASCULAR ACCESS  02/05/2018   Procedure: Ultrasound Guidance For Vascular Access;  Surgeon: Belva Crome, MD;  Location: [place] [facility];  Service: Cardiovascular;;     Current Outpatient Medications  Medication Sig Dispense Refill  . betamethasone dipropionate (DIPROLENE) 0.05 % cream Apply 1 application topically 2 (two) times daily as needed. unk  1  . carvedilol (COREG) 6.25 MG tablet Take 1 tablet (6.25 mg total) by mouth 2 (two) times daily with a meal.    . fluticasone (FLONASE) 50 MCG/ACT nasal spray Place 1 spray into both nostrils daily as needed for allergies.     Marland Kitchen gabapentin (NEURONTIN) 300 MG capsule Take 300 mg by mouth 3 (three) times daily.    Marland Kitchen losartan (COZAAR) 100 MG tablet Take 1 tablet (100 mg total) by mouth daily. 90 tablet 3  . omeprazole (PRILOSEC) 20 MG capsule Take 20 mg by mouth 2 (two) times daily.    . Probiotic Product (ALIGN PO) Take 1 capsule  by mouth daily.     No current facility-administered medications for this visit.     Allergies:   Aspirin; Whiskey [alcohol]; Amoxicillin; Avelox [moxifloxacin hcl in nacl]; Lisinopril; Penicillins; Sulfa antibiotics; Tape; and Zithromax [azithromycin]   Social History:  The patient  reports that he has quit smoking. He has never used smokeless tobacco. He reports previous alcohol use. He reports that he does not use drugs.   Family History:  The patient's family history includes Heart disease in his father.    ROS:  Please see the history of present illness.    Otherwise, review of systems is positive for none.   All other systems are reviewed and negative.    PHYSICAL EXAM: VS:  BP 130/80   Pulse 85   Ht 5\' 10"  (1.778 m)   Wt 162 lb (73.5 kg)   BMI 23.24 kg/m  , BMI Body mass index is 23.24 kg/m. GEN: Well nourished, well developed, in no acute distress  HEENT: normal  Neck: no JVD, carotid bruits, or masses Cardiac: RRR; no murmurs, rubs, or gallops,no edema  Respiratory:  clear to auscultation bilaterally, normal work of breathing GI: soft, nontender, nondistended, + BS MS: no deformity or atrophy  Skin: warm and dry, device pocket is well healed Neuro:  Strength and sensation are intact Psych: euthymic mood, full affect  EKG:  EKG is ordered today. Personal review of the ekg ordered shows V paced, intermittent A pacing  Device interrogation is reviewed today in detail.  See PaceArt for details.   Recent Labs: 02/03/2018: TSH 0.648 03/05/2018: ALT 13 03/08/2018: BUN 17; Creatinine, Ser 1.16; Hemoglobin 11.4; Magnesium 2.1; Platelets 249; Potassium 4.1; Sodium 139    Lipid Panel  No results found for: CHOL, TRIG, HDL, CHOLHDL, VLDL, LDLCALC, LDLDIRECT   Wt Readings from Last 3 Encounters:  05/25/18 162 lb (73.5 kg)  03/23/18 164 lb 12.8 oz (74.8 kg)  03/08/18 164 lb 7.4 oz (74.6 kg)      Other studies Reviewed: Additional studies/ records that were reviewed today include: TTE 02/04/18  Review of the above records today demonstrates:  - Left ventricle: The cavity size was normal. Wall thickness was   normal. Systolic function was mildly reduced. The estimated   ejection fraction was in the range of 45% to 50%. Basal to mid   inferior hypokinesis. Doppler parameters are consistent with   pseudonormal left ventricular relaxation (grade 2 diastolic   dysfunction). The E/e&' ratio is >15, suggesting elevated LV   filling pressure. - Left atrium: The atrium was normal in size. - Right ventricle: The cavity size was mildly  dilated. - Right atrium: Severely dilated. - Tricuspid valve: Could not measure TR jet.  LHC 02/06/18  Right dominant coronary anatomy  Ectatic left main without obstruction.  Luminal irregularities are noted  LAD contains ectasia proximally and in the mid to distal vessel there is eccentric segmental 30 to 40% narrowing  The circumflex artery gives origin to 3 marginals with the second marginal being the largest.  Luminal irregularities are noted  The RCA is widely patent with luminal irregularities.  Left ventricular end-diastolic pressure is normal at 15 mmHg.  Ventriculography was not performed because of mild chronic kidney disease.  Total contrast, 40 cc   ASSESSMENT AND PLAN:  1.  2-1 AV block: Status post [employer] dual-chamber pacemaker.  Device functioning appropriately.  No changes.  2.  Hypertension: Well-controlled today.  No changes.  3.  Chronic diastolic heart failure: No  signs of volume overload  4.  Paroxysmal atrial fibrillation: New found on his device today.  He is rate controlled.  We Alquan Morrish plan to start Eliquis today for stroke prevention.  This patients CHA2DS2-VASc Score and unadjusted Ischemic Stroke Rate (% per year) is equal to 3.2 % stroke rate/year from a score of 3  Above score calculated as 1 point each if present [CHF, HTN, DM, Vascular=MI/PAD/Aortic Plaque, Age if 65-74, or Male] Above score calculated as 2 points each if present [Age > 75, or Stroke/TIA/TE]   Current medicines are reviewed at length with the patient today.   The patient does not have concerns regarding his medicines.  The following changes were made today: Start Eliquis  Labs/ tests ordered today include:  Orders Placed This Encounter  Procedures  . EKG 12-Lead     Disposition:   FU with Liam Cammarata 6 months  Signed, Maraki Macquarrie Frank Leeds, MD  05/25/2018 2:25 PM     [place] [address] [phone]  (office) [phone] (fax)

## 2018-05-25 NOTE — Patient Instructions (Addendum)
Medication Instructions:  Your physician has recommended you make the following change in your medication:  1. START Eliquis 5 mg twice a day  *If you need a refill on your cardiac medications before your next appointment, please call your pharmacy*  Labwork: None ordered  Testing/Procedures: None ordered  Follow-Up: Remote monitoring is used to monitor your Pacemaker or ICD from home. This monitoring reduces the number of office visits required to check your device to one time per year. It allows [place] to keep an eye on the functioning of your device to ensure it is working properly. You are scheduled for a device check from home on 08/24/2018. You may send your transmission at any time that day. If you have a wireless device, the transmission will be sent automatically. After your physician reviews your transmission, you will receive a postcard with your next transmission date.  Your physician wants you to follow-up in: 6 months with Dr. Curt Bears.  You will receive a reminder letter in the mail two months in advance. If you don't receive a letter, please call our office to schedule the follow-up appointment.  Thank you for choosing [facility]!!   Trinidad Curet, RN [phone]  Any Other Special Instructions Will Be Listed Below (If Applicable).   Apixaban oral tablets What is this medicine? APIXABAN (a PIX a ban) is an anticoagulant (blood thinner). It is used to lower the chance of stroke in people with a medical condition called atrial fibrillation. It is also used to treat or prevent blood clots in the lungs or in the veins. This medicine may be used for other purposes; ask your health care provider or pharmacist if you have questions. COMMON BRAND NAME(S): Eliquis What should I tell my health care provider before I take this medicine? They need to know if you have any of these conditions: -bleeding disorders -bleeding in the brain -blood in your stools (black or tarry  stools) or if you have blood in your vomit -history of stomach bleeding -kidney disease -liver disease -mechanical heart valve -an unusual or allergic reaction to apixaban, other medicines, foods, dyes, or preservatives -pregnant or trying to get pregnant -breast-feeding How should I use this medicine? Take this medicine by mouth with a glass of water. Follow the directions on the prescription label. You can take it with or without food. If it upsets your stomach, take it with food. Take your medicine at regular intervals. Do not take it more often than directed. Do not stop taking except on your doctor's advice. Stopping this medicine may increase your risk of a blood clot. Be sure to refill your prescription before you run out of medicine. Talk to your pediatrician regarding the use of this medicine in children. Special care may be needed. Overdosage: If you think you have taken too much of this medicine contact a poison control center or emergency room at once. NOTE: This medicine is only for you. Do not share this medicine with others. What if I miss a dose? If you miss a dose, take it as soon as you can. If it is almost time for your next dose, take only that dose. Do not take double or extra doses. What may interact with this medicine? This medicine may interact with the following: -aspirin and aspirin-like medicines -certain medicines for fungal infections like ketoconazole and itraconazole -certain medicines for seizures like carbamazepine and phenytoin -certain medicines that treat or prevent blood clots like warfarin, enoxaparin, and dalteparin -clarithromycin -NSAIDs, medicines for  pain and inflammation, like ibuprofen or naproxen -rifampin -ritonavir -St. John's wort This list may not describe all possible interactions. Give your health care provider a list of all the medicines, herbs, non-prescription drugs, or dietary supplements you use. Also tell them if you smoke, drink  alcohol, or use illegal drugs. Some items may interact with your medicine. What should I watch for while using this medicine? Visit your healthcare professional for regular checks on your progress. You may need blood work done while you are taking this medicine. Your condition will be monitored carefully while you are receiving this medicine. It is important not to miss any appointments. Avoid sports and activities that might cause injury while you are using this medicine. Severe falls or injuries can cause unseen bleeding. Be careful when using sharp tools or knives. Consider using an Copy. Take special care brushing or flossing your teeth. Report any injuries, bruising, or red spots on the skin to your healthcare professional. If you are going to need surgery or other procedure, tell your healthcare professional that you are taking this medicine. Wear a medical ID bracelet or chain. Carry a card that describes your disease and details of your medicine and dosage times. What side effects may I notice from receiving this medicine? Side effects that you should report to your doctor or health care professional as soon as possible: -allergic reactions like skin rash, itching or hives, swelling of the face, lips, or tongue -signs and symptoms of bleeding such as bloody or black, tarry stools; red or dark-brown urine; spitting up blood or brown material that looks like coffee grounds; red spots on the skin; unusual bruising or bleeding from the eye, gums, or nose -signs and symptoms of a blood clot such as chest pain; shortness of breath; pain, swelling, or warmth in the leg -signs and symptoms of a stroke such as changes in vision; confusion; trouble speaking or understanding; severe headaches; sudden numbness or weakness of the face, arm or leg; trouble walking; dizziness; loss of coordination This list may not describe all possible side effects. Call your doctor for medical advice about side  effects. You may report side effects to FDA at 1-800-FDA-1088. Where should I keep my medicine? Keep out of the reach of children. Store at room temperature between 20 and 25 degrees C (68 and 77 degrees F). Throw away any unused medicine after the expiration date. NOTE: This sheet is a summary. It may not cover all possible information. If you have questions about this medicine, talk to your doctor, pharmacist, or health care provider.  2019 Elsevier/Gold Standard (2017-04-24 11:20:07)

## 2018-05-26 LAB — CUP PACEART INCLINIC DEVICE CHECK
Battery Remaining Longevity: 127 mo
Brady Statistic RV Percent Paced: 97 %
Implantable Lead Implant Date: 20190927
Implantable Lead Location: 753860
Implantable Pulse Generator Implant Date: 20190927
Lead Channel Impedance Value: 587.5 Ohm
Lead Channel Pacing Threshold Amplitude: 0.625 V
Lead Channel Pacing Threshold Pulse Width: 0.5 ms
Lead Channel Sensing Intrinsic Amplitude: 12 mV
Lead Channel Sensing Intrinsic Amplitude: 2.4 mV
Lead Channel Setting Pacing Amplitude: 0.875
Lead Channel Setting Pacing Amplitude: 2 V
Lead Channel Setting Pacing Pulse Width: 0.5 ms
Lead Channel Setting Sensing Sensitivity: 2 mV
MDC IDC LEAD IMPLANT DT: 20190927
MDC IDC LEAD LOCATION: 753859
MDC IDC MSMT BATTERY VOLTAGE: 2.99 V
MDC IDC MSMT LEADCHNL RA IMPEDANCE VALUE: 525 Ohm
MDC IDC MSMT LEADCHNL RA PACING THRESHOLD AMPLITUDE: 0.5 V
MDC IDC MSMT LEADCHNL RA PACING THRESHOLD AMPLITUDE: 0.5 V
MDC IDC MSMT LEADCHNL RA PACING THRESHOLD PULSEWIDTH: 0.5 ms
MDC IDC MSMT LEADCHNL RV PACING THRESHOLD PULSEWIDTH: 0.5 ms
MDC IDC SESS DTM: 20200113200135
MDC IDC STAT BRADY RA PERCENT PACED: 20 %
Pulse Gen Serial Number: 9066149

## 2018-06-03 DIAGNOSIS — L570 Actinic keratosis: Secondary | ICD-10-CM | POA: Diagnosis not present

## 2018-06-03 DIAGNOSIS — C4442 Squamous cell carcinoma of skin of scalp and neck: Secondary | ICD-10-CM | POA: Diagnosis not present

## 2018-06-03 DIAGNOSIS — C44622 Squamous cell carcinoma of skin of right upper limb, including shoulder: Secondary | ICD-10-CM | POA: Diagnosis not present

## 2018-06-03 DIAGNOSIS — D485 Neoplasm of uncertain behavior of skin: Secondary | ICD-10-CM | POA: Diagnosis not present

## 2018-06-04 ENCOUNTER — Telehealth: Payer: Self-pay | Admitting: Cardiology

## 2018-06-04 NOTE — Telephone Encounter (Signed)
New message   Per Dr. Brynda Greathouse states that Dr. Curt Bears has advised that he needs to be contacted before the patient has any dental work. Dr. Imagene Riches is inquiring on why Dr. Curt Bears needs to be contacted. The patient is scheduled for dental work on next week. Please advise.

## 2018-06-04 NOTE — Telephone Encounter (Signed)
Routing to Dr. Camnitz for advisement. 

## 2018-06-05 NOTE — Telephone Encounter (Signed)
Attempted to reach dental office to return call, they are closed this afternoon. I also tried to reach the patient with no luck, got voicemail. Will try again Monday

## 2018-06-08 NOTE — Telephone Encounter (Signed)
Follow up    Patient is returning call in reference to upcoming dental appt. Please call to discuss.

## 2018-06-08 NOTE — Telephone Encounter (Signed)
Spoke to pt and dental office.   Pt is at their office now. Instructed pt does not need to stop Eliquis for a single tooth extraction.  Pt already held it this morning, advised office to have pt restart Eliquis tonight.  They will pass information along to patient.

## 2018-06-08 NOTE — Telephone Encounter (Signed)
I will route to Dr. Curt Bears nurse Venida Jarvis as she may recommendations per Dr. Baird Kay

## 2018-06-19 DIAGNOSIS — I10 Essential (primary) hypertension: Secondary | ICD-10-CM | POA: Diagnosis not present

## 2018-07-06 DIAGNOSIS — C4442 Squamous cell carcinoma of skin of scalp and neck: Secondary | ICD-10-CM | POA: Diagnosis not present

## 2018-07-06 DIAGNOSIS — C44529 Squamous cell carcinoma of skin of other part of trunk: Secondary | ICD-10-CM | POA: Diagnosis not present

## 2018-07-06 DIAGNOSIS — L905 Scar conditions and fibrosis of skin: Secondary | ICD-10-CM | POA: Diagnosis not present

## 2018-07-12 ENCOUNTER — Encounter: Payer: Self-pay | Admitting: Cardiology

## 2018-07-12 NOTE — Progress Notes (Signed)
Pt in persistent AF since 07/09/18.  Pt with known PAF. On Eliquis for [place]. Previously was maintaining SR. Will route to Dr Curt Bears for review. May benefit from AF clinic appointment.    Chanetta Marshall, NP 07/12/2018 7:56 PM

## 2018-07-13 NOTE — Progress Notes (Signed)
[facility]) made aware office would contact him about making appt w/ AFib clinic to discuss further.  Son also asks about pt driving.  Asks if his heart issues would keep him from driving.  Son is interested in how they can have dad's licence taken away.  States that w/i last several weeks pt's grandson was "sitting with him and the table and pt became unresponsive".  Pt was "spaced out and just staring and not responding to people speaking to him".  Grandson "waved his hand back and forth in front of dad's face but still didn't respond to that". After a few minutes pt "came too". Advised them to call PCP office to discuss concerns.   Also informed son that I would discuss w/ Camnitz to see if there is any cardiology medical reason to support not driving.  Aware that I will f/u once advised on. Son agreeable to plan.

## 2018-07-13 NOTE — Progress Notes (Signed)
Would have him follow up in AF clinic to discuss increasing burden and persistent AF.

## 2018-07-13 NOTE — Progress Notes (Signed)
Forwarding to EP scheduler to make appt

## 2018-07-15 NOTE — Progress Notes (Signed)
Made son aware Dr. Curt Bears agrees that they should discuss driving privileges with pt's PCP.  Son is agreeable.  He understands office will follow up about scheduling AFib clinic appt. Son verbalized understanding.

## 2018-07-20 DIAGNOSIS — L820 Inflamed seborrheic keratosis: Secondary | ICD-10-CM | POA: Diagnosis not present

## 2018-07-20 DIAGNOSIS — D485 Neoplasm of uncertain behavior of skin: Secondary | ICD-10-CM | POA: Diagnosis not present

## 2018-07-21 ENCOUNTER — Other Ambulatory Visit: Payer: Self-pay | Admitting: Interventional Cardiology

## 2018-07-22 ENCOUNTER — Other Ambulatory Visit: Payer: Self-pay

## 2018-07-22 ENCOUNTER — Encounter (HOSPITAL_COMMUNITY): Payer: Self-pay | Admitting: Physician Assistant

## 2018-07-22 ENCOUNTER — Ambulatory Visit (HOSPITAL_COMMUNITY)
Admission: RE | Admit: 2018-07-22 | Discharge: 2018-07-22 | Disposition: A | Discharge status: Home / Self Care | Payer: Medicare Other | Source: Ambulatory Visit | Attending: Physician Assistant | Admitting: Physician Assistant

## 2018-07-22 VITALS — BP 118/68 | HR 70 | Ht 70.0 in | Wt 165.0 lb

## 2018-07-22 DIAGNOSIS — I110 Hypertensive heart disease with heart failure: Secondary | ICD-10-CM | POA: Diagnosis not present

## 2018-07-22 DIAGNOSIS — Z87891 Personal history of nicotine dependence: Secondary | ICD-10-CM | POA: Diagnosis not present

## 2018-07-22 DIAGNOSIS — Z8249 Family history of ischemic heart disease and other diseases of the circulatory system: Secondary | ICD-10-CM | POA: Insufficient documentation

## 2018-07-22 DIAGNOSIS — I4819 Other persistent atrial fibrillation: Secondary | ICD-10-CM | POA: Diagnosis not present

## 2018-07-22 DIAGNOSIS — I5022 Chronic systolic (congestive) heart failure: Secondary | ICD-10-CM | POA: Insufficient documentation

## 2018-07-22 DIAGNOSIS — Z7901 Long term (current) use of anticoagulants: Secondary | ICD-10-CM | POA: Insufficient documentation

## 2018-07-22 DIAGNOSIS — K219 Gastro-esophageal reflux disease without esophagitis: Secondary | ICD-10-CM | POA: Diagnosis not present

## 2018-07-22 DIAGNOSIS — Z79899 Other long term (current) drug therapy: Secondary | ICD-10-CM | POA: Insufficient documentation

## 2018-07-22 LAB — CBC
HCT: 39.1 % (ref 39.0–52.0)
HEMOGLOBIN: 13.1 g/dL (ref 13.0–17.0)
MCH: 34.1 pg — ABNORMAL HIGH (ref 26.0–34.0)
MCHC: 33.5 g/dL (ref 30.0–36.0)
MCV: 101.8 fL — ABNORMAL HIGH (ref 80.0–100.0)
NRBC: 0 % (ref 0.0–0.2)
Platelets: 244 10*3/uL (ref 150–400)
RBC: 3.84 MIL/uL — AB (ref 4.22–5.81)
RDW: 13.4 % (ref 11.5–15.5)
WBC: 5.6 10*3/uL (ref 4.0–10.5)

## 2018-07-22 NOTE — Progress Notes (Signed)
Primary Care Physician: [name], MD Primary Cardiologist: Dr [name] Primary Electrophysiologist: Dr Curt Bears Referring Physician: Dr Camnitz/device clinic   Frank Elliott is a 85 y.o. male with a history of persistent atrial fibrillation, 2-1 AV block s/p PPM, HTN, chronic systolic CHF,  who presents for consultation in the [facility].  The patient was initially diagnosed with atrial fibrillation on device interrogation which was paroxysmal at first but now appears to be persistent since the end of February. Patient reports he is unaware of his arrhythmia and has not felt any different in the last few weeks. No triggers that the patient could identify. He is V-paced today with underlying afib. He does report some lower extremity edema which has been stable for him. No orthopnea or PND. He denies any weight increase.  Today, he denies symptoms of palpitations, chest pain, shortness of breath, orthopnea, PND, dizziness, presyncope, syncope, snoring, daytime somnolence, bleeding, or neurologic sequela. The patient is tolerating medications without difficulties and is otherwise without complaint today.    Atrial Fibrillation Risk Factors:  he does not have symptoms or diagnosis of sleep apnea. he does not have a history of rheumatic fever. he does not have a history of alcohol use. The patient does not have a history of early familial atrial fibrillation or other arrhythmias.  he has a BMI of Body mass index is 23.68 kg/m.Marland Kitchen Filed Weights   07/22/18 1102  Weight: 74.8 kg    Family History  Problem Relation Age of Onset  . Heart disease Father      Atrial Fibrillation Management history:  Previous antiarrhythmic drugs: none Previous cardioversions: none Previous ablations: none CHADS2VASC score: 3 (age, HTN) Anticoagulation history: Eliquis   Past Medical History:  Diagnosis Date  . Arthritis    "just about all over" (02/05/2018)  . AV  block, Mobitz 2 02/03/2018   PPM placed 01/2018  . BPH (benign prostatic hyperplasia)   . Chronic diastolic (congestive) heart failure ([place]) 2017  . Complication of anesthesia    "had hard time waking up a long time ago" (02/05/2018)  . Cyst of kidney, acquired    "recently found; ? side" (02/05/2018)  . Dyspnea   . GERD (gastroesophageal reflux disease)   . History of hiatal hernia   . Hypertension   . Neuropathy   . Pleural effusion    dx 04/01/12  . Skin cancer    "face, nose" (02/05/2018)  . Syncope 02/03/2018   Past Surgical History:  Procedure Laterality Date  . APPENDECTOMY    . BACK SURGERY    . CARDIAC CATHETERIZATION  02/05/2018  . CATARACT EXTRACTION, BILATERAL Bilateral   . CHOLECYSTECTOMY    . JOINT REPLACEMENT    . LEFT HEART CATH AND CORONARY ANGIOGRAPHY N/A 02/05/2018   Procedure: LEFT HEART CATH AND CORONARY ANGIOGRAPHY;  Surgeon: Belva Crome, MD;  Location: [facility];  Service: Cardiovascular;  Laterality: N/A;  . LUMBAR [place] SURGERY  1980s  . PACEMAKER IMPLANT N/A 02/06/2018   Procedure: PACEMAKER IMPLANT;  Surgeon: Constance Haw, MD;  Location: [facility];  Service: Cardiovascular;  Laterality: N/A;  . SKIN CANCER EXCISION     "face, nose" (02/05/2018)  . TONSILLECTOMY    . TOTAL HIP ARTHROPLASTY Right 1990s/2000s  . ULTRASOUND GUIDANCE FOR VASCULAR ACCESS  02/05/2018   Procedure: Ultrasound Guidance For Vascular Access;  Surgeon: Belva Crome, MD;  Location: [facility];  Service: Cardiovascular;;  Current Outpatient Medications  Medication Sig Dispense Refill  . apixaban (ELIQUIS) 5 MG TABS tablet Take 1 tablet (5 mg total) by mouth 2 (two) times daily. 60 tablet 6  . betamethasone dipropionate (DIPROLENE) 0.05 % cream Apply 1 application topically 2 (two) times daily as needed. unk  1  . carvedilol (COREG) 6.25 MG tablet TAKE 1 TABLET BY MOUTH TWICE A DAY 180 tablet 1  . fluticasone (FLONASE) 50 MCG/ACT nasal spray  Place 1 spray into both nostrils daily as needed for allergies.     Marland Kitchen gabapentin (NEURONTIN) 300 MG capsule Take 300 mg by mouth 3 (three) times daily.    Marland Kitchen losartan (COZAAR) 100 MG tablet Take 1 tablet (100 mg total) by mouth daily. 90 tablet 3  . omeprazole (PRILOSEC) 20 MG capsule Take 20 mg by mouth 2 (two) times daily.    . Probiotic Product (ALIGN PO) Take 1 capsule by mouth daily.     No current facility-administered medications for this encounter.     Allergies  Allergen Reactions  . Aspirin Anaphylaxis, Hives and Swelling  . Whiskey [Alcohol] Anaphylaxis and Hives  . Amoxicillin Other (See Comments)    Causes sore throat  . Avelox [Moxifloxacin Hcl In Nacl] Other (See Comments)    Caused diarrhea & constipation  . Lisinopril Cough  . Penicillins Other (See Comments)    Unknown reaction  . Sulfa Antibiotics Hives  . Tape Itching    Also causes redness--Paper tape okay  . Zithromax [Azithromycin] Other (See Comments)    Sore throat    Social History   Socioeconomic History  . Marital status: Widowed    Spouse name: Not on file  . Number of children: Not on file  . Years of education: Not on file  . Highest education level: Not on file  Occupational History  . Occupation: Retired  Scientific laboratory technician  . Financial resource strain: Not on file  . Food insecurity:    Worry: Not on file    Inability: Not on file  . Transportation needs:    Medical: Not on file    Non-medical: Not on file  Tobacco Use  . Smoking status: Former Research scientist (life sciences)  . Smokeless tobacco: Never Used  . Tobacco comment: "smoked & chewed  in teens & early 20's"  Substance and Sexual Activity  . Alcohol use: Not Currently    Comment: "drank in teens & early 20's"  . Drug use: Never  . Sexual activity: Not on file  Lifestyle  . Physical activity:    Days per week: Not on file    Minutes per session: Not on file  . Stress: Not on file  Relationships  . Social connections:    Talks on phone: Not on  file    Gets together: Not on file    Attends religious service: Not on file    Active member of club or organization: Not on file    Attends meetings of clubs or organizations: Not on file    Relationship status: Not on file  . Intimate partner violence:    Fear of current or ex partner: Not on file    Emotionally abused: Not on file    Physically abused: Not on file    Forced sexual activity: Not on file  Other Topics Concern  . Not on file  Social History Narrative   He lives alone, son frequently goes with him to doctor's appointments.  Other family members help in his care.  ROS- All systems are reviewed and negative except as per the HPI above.  Physical Exam: Vitals:   07/22/18 1102  BP: 118/68  Pulse: 70  Weight: 74.8 kg  Height: 5\' 10"  (1.778 m)    GEN- The patient is well appearing elderly male, alert and oriented x 3 today.   Head- normocephalic, atraumatic Eyes-  Sclera clear, conjunctiva pink Ears- hearing intact Oropharynx- clear Neck- supple  Lungs- Clear to ausculation bilaterally, normal work of breathing Heart- Regular rate and rhythm, no murmurs, rubs or gallops  GI- soft, NT, ND, + BS Extremities- no clubbing, cyanosis, trace edema MS- no significant deformity or atrophy Skin- no rash or lesion Psych- euthymic mood, full affect Neuro- strength and sensation are intact  Wt Readings from Last 3 Encounters:  07/22/18 74.8 kg  05/25/18 73.5 kg  03/23/18 74.8 kg    EKG today demonstrates V-paced HR 70, QRS 146, QTc 464  Echo 02/04/18 demonstrated  - Left ventricle: The cavity size was normal. Wall thickness was   normal. Systolic function was mildly reduced. The estimated   ejection fraction was in the range of 45% to 50%. Basal to mid   inferior hypokinesis. Doppler parameters are consistent with   pseudonormal left ventricular relaxation (grade 2 diastolic   dysfunction). The E/e&' ratio is >15, suggesting elevated LV   filling pressure.  - Left atrium: The atrium was normal in size. - Right ventricle: The cavity size was mildly dilated. - Right atrium: Severely dilated. - Tricuspid valve: Could not measure TR jet.  Epic records are reviewed at length today  Assessment and Plan:  1. Persistent atrial fibrillation The patient has persistent atrial fibrillation. Patient appears to be asymptomatic of his arrhythmia.  Given advanced age, declining cognitive impairment, and failure to thrive, would recommend a conservative approach. We discussed therapeutic options including AAD and at this time patient would like to pursue a rate control strategy. Continue Eliquis 5 mg BID Continue Coreg 6.25 mg BID Check CBC/TSH  This patients CHA2DS2-VASc Score and unadjusted Ischemic Stroke Rate (% per year) is equal to 3.2 % stroke rate/year from a score of 3  Above score calculated as 1 point each if present [CHF, HTN, DM, Vascular=MI/PAD/Aortic Plaque, Age if 65-74, or Male] Above score calculated as 2 points each if present [Age > 75, or Stroke/TIA/TE]   2. HTN Stable, no changes today.  3. Chronic systolic CHF No overt signs of fluid overload today. Encouraged daily weights. Continue ARB and BB. 2 g sodium diet.   Follow up in the Afib Clinic in one month. Dr [name] and Dr Curt Bears as scheduled.  [facility] [address] [place], [place] [address] [phone] 07/22/2018 11:55 AM

## 2018-08-21 ENCOUNTER — Ambulatory Visit (HOSPITAL_COMMUNITY): Payer: Medicare Other | Admitting: Physician Assistant

## 2018-08-24 ENCOUNTER — Ambulatory Visit (INDEPENDENT_AMBULATORY_CARE_PROVIDER_SITE_OTHER): Payer: Medicare Other | Admitting: *Deleted

## 2018-08-24 ENCOUNTER — Other Ambulatory Visit: Payer: Self-pay

## 2018-08-24 DIAGNOSIS — I441 Atrioventricular block, second degree: Secondary | ICD-10-CM | POA: Diagnosis not present

## 2018-08-24 LAB — CUP PACEART REMOTE DEVICE CHECK
Battery Remaining Longevity: 122 mo
Battery Remaining Percentage: 95.5 %
Battery Voltage: 2.99 V
Brady Statistic AP VP Percent: 30 %
Brady Statistic AP VS Percent: 1 %
Brady Statistic AS VP Percent: 67 %
Brady Statistic AS VS Percent: 1 %
Brady Statistic RA Percent Paced: 14 %
Brady Statistic RV Percent Paced: 96 %
Date Time Interrogation Session: 20200412060015
Implantable Lead Implant Date: 20190927
Implantable Lead Implant Date: 20190927
Implantable Lead Location: 753859
Implantable Lead Location: 753860
Implantable Pulse Generator Implant Date: 20190927
Lead Channel Impedance Value: 510 Ohm
Lead Channel Impedance Value: 550 Ohm
Lead Channel Pacing Threshold Amplitude: 0.5 V
Lead Channel Pacing Threshold Amplitude: 0.5 V
Lead Channel Pacing Threshold Pulse Width: 0.5 ms
Lead Channel Pacing Threshold Pulse Width: 0.5 ms
Lead Channel Sensing Intrinsic Amplitude: 1.8 mV
Lead Channel Sensing Intrinsic Amplitude: 12 mV
Lead Channel Setting Pacing Amplitude: 0.75 V
Lead Channel Setting Pacing Amplitude: 2 V
Lead Channel Setting Pacing Pulse Width: 0.5 ms
Lead Channel Setting Sensing Sensitivity: 2 mV
Pulse Gen Model: 2272
Pulse Gen Serial Number: 9066149

## 2018-08-28 ENCOUNTER — Ambulatory Visit (HOSPITAL_COMMUNITY)
Admission: RE | Admit: 2018-08-28 | Discharge: 2018-08-28 | Disposition: A | Discharge status: Home / Self Care | Payer: Medicare Other | Source: Ambulatory Visit | Attending: Physician Assistant | Admitting: Physician Assistant

## 2018-08-28 ENCOUNTER — Other Ambulatory Visit: Payer: Self-pay

## 2018-08-28 DIAGNOSIS — I4819 Other persistent atrial fibrillation: Secondary | ICD-10-CM

## 2018-08-28 NOTE — Progress Notes (Signed)
Electrophysiology TeleHealth Note   Due to national recommendations of social distancing due to [place] 19, Audio telehealth visit is felt to be most appropriate for this patient at this time.  See consent below from today for patient consent regarding telehealth for the Atrial Fibrillation Clinic. Consent obtained verbally.   Date:  08/28/2018   ID:  Frank Elliott, DOB 06-Jun-1925, MRN [id number]  Location: home  Provider location: [address] [place], [place] [address] Evaluation Performed: Follow up  PCP:  Frank Melter, MD  Primary Cardiologist:  Dr [name] Primary Electrophysiologist: Dr Curt Bears   CC: Follow up for atrial fibrillation   History of Present Illness: Frank Elliott is a 85 y.o. male who presents via audio conferencing for a telehealth visit today. His son, Frank Elliott, also participated in the audio conference call. Patient reports that he feels about the same as he did at our last visit. He states that he doesn't have much "get up and go" but he and his son agree that this has been stable for several months, predated onset of persistent afib. He denies any SOB, chest pain, dizziness, or symptoms of fluid overload. He is compliant with social distancing during COVID-19 pandemic.   Today, he denies symptoms of palpitations, chest pain, shortness of breath, orthopnea, PND, lower extremity edema, claudication, dizziness, presyncope, syncope, bleeding, or neurologic sequela. The patient is tolerating medications without difficulties and is otherwise without complaint today.   he denies symptoms of cough, fevers, chills, or new SOB worrisome for COVID 19.     Atrial Fibrillation Risk Factors:  he does not have symptoms or diagnosis of sleep apnea. he does not have a history of rheumatic fever. he does not have a history of alcohol use. The patient does not have a history of early familial atrial fibrillation or other arrhythmias.  he has a BMI of There is no height or  weight on file to calculate BMI..  BP 128/86 Provided by patient with home BP machine  Past Medical History:  Diagnosis Date  . Arthritis    "just about all over" (02/05/2018)  . AV block, Mobitz 2 02/03/2018   PPM placed 01/2018  . BPH (benign prostatic hyperplasia)   . Chronic diastolic (congestive) heart failure ([place]) 2017  . Complication of anesthesia    "had hard time waking up a long time ago" (02/05/2018)  . Cyst of kidney, acquired    "recently found; ? side" (02/05/2018)  . Dyspnea   . GERD (gastroesophageal reflux disease)   . History of hiatal hernia   . Hypertension   . Neuropathy   . Pleural effusion    dx 04/01/12  . Skin cancer    "face, nose" (02/05/2018)  . Syncope 02/03/2018   Past Surgical History:  Procedure Laterality Date  . APPENDECTOMY    . BACK SURGERY    . CARDIAC CATHETERIZATION  02/05/2018  . CATARACT EXTRACTION, BILATERAL Bilateral   . CHOLECYSTECTOMY    . JOINT REPLACEMENT    . LEFT HEART CATH AND CORONARY ANGIOGRAPHY N/A 02/05/2018   Procedure: LEFT HEART CATH AND CORONARY ANGIOGRAPHY;  Surgeon: Belva Crome, MD;  Location: [facility];  Service: Cardiovascular;  Laterality: N/A;  . LUMBAR [place] SURGERY  1980s  . PACEMAKER IMPLANT N/A 02/06/2018   Procedure: PACEMAKER IMPLANT;  Surgeon: Constance Haw, MD;  Location: [facility];  Service: Cardiovascular;  Laterality: N/A;  . SKIN CANCER EXCISION     "face, nose" (  02/05/2018)  . TONSILLECTOMY    . TOTAL HIP ARTHROPLASTY Right 1990s/2000s  . ULTRASOUND GUIDANCE FOR VASCULAR ACCESS  02/05/2018   Procedure: Ultrasound Guidance For Vascular Access;  Surgeon: Belva Crome, MD;  Location: [facility];  Service: Cardiovascular;;     Current Outpatient Medications  Medication Sig Dispense Refill  . apixaban (ELIQUIS) 5 MG TABS tablet Take 1 tablet (5 mg total) by mouth 2 (two) times daily. 60 tablet 6  . betamethasone dipropionate (DIPROLENE) 0.05 % cream Apply 1  application topically 2 (two) times daily as needed. unk  1  . carvedilol (COREG) 6.25 MG tablet TAKE 1 TABLET BY MOUTH TWICE A DAY 180 tablet 1  . fluticasone (FLONASE) 50 MCG/ACT nasal spray Place 1 spray into both nostrils daily as needed for allergies.     Marland Kitchen gabapentin (NEURONTIN) 300 MG capsule Take 300 mg by mouth 3 (three) times daily.    Marland Kitchen losartan (COZAAR) 100 MG tablet Take 1 tablet (100 mg total) by mouth daily. 90 tablet 3  . omeprazole (PRILOSEC) 20 MG capsule Take 20 mg by mouth 2 (two) times daily.    . Probiotic Product (ALIGN PO) Take 1 capsule by mouth daily.     No current facility-administered medications for this encounter.     Allergies:   Aspirin; Whiskey [alcohol]; Amoxicillin; Avelox [moxifloxacin hcl in nacl]; Lisinopril; Penicillins; Sulfa antibiotics; Tape; and Zithromax [azithromycin]   Social History:  The patient  reports that he has quit smoking. He has never used smokeless tobacco. He reports previous alcohol use. He reports that he does not use drugs.   Family History:  The patient's  family history includes Heart disease in his father.    ROS:  Please see the history of present illness.   All other systems are personally reviewed and negative.   Recent Labs: 02/03/2018: TSH 0.648 03/05/2018: ALT 13 03/08/2018: BUN 17; Creatinine, Ser 1.16; Magnesium 2.1; Potassium 4.1; Sodium 139 07/22/2018: Hemoglobin 13.1; Platelets 244  personally reviewed    Other studies personally reviewed: Additional studies/ records that were reviewed today include: Epic notes, device interrogation    ASSESSMENT AND Elliott:  1. Persistent atrial fibrillation Patient has been in persistent atrial fibrillation since March. Rate controlled on device interrogation.  Patient is asymptomatic of his arrhythmia. Would continue to recommend a conservative approach given advanced age, cognitive decline, and COVID-19 precautions. Patient and son in agreement with Elliott. Continue Coreg  6.25 mg BID Continue Eliquis 5 mg BID  This patients CHA2DS2-VASc Score and unadjusted Ischemic Stroke Rate (% per year) is equal to 3.2 % stroke rate/year from a score of 3  Above score calculated as 1 point each if present [CHF, HTN, DM, Vascular=MI/PAD/Aortic Plaque, Age if 65-74, or Male] Above score calculated as 2 points each if present [Age > 75, or Stroke/TIA/TE]  2. HTN Stable, no changes today   COVID screen The patient does not have any symptoms that suggest any further testing/ screening at this time.  Social distancing reinforced today.    Follow-up with Dr [name] and Dr Curt Bears as scheduled.   Current medicines are reviewed at length with the patient today.   The patient does not have concerns regarding his medicines.  The following changes were made today:  none  Labs/ tests ordered today include:  No orders of the defined types were placed in this encounter.   Patient Risk:  after full review of this patients clinical status, I feel that they are  at moderate risk at this time.   Today, I have spent 13 minutes with the patient with telehealth technology discussing atrial fibrillation and COVID-19 precautions.    Gwenlyn Perking PA-C 08/28/2018 10:52 AM  Afib [facility] [place], [place] [address] [phone]   I hereby voluntarily request, consent and authorize the [facility] and its employed or contracted physicians, physician assistants, nurse practitioners or other licensed health care professionals (the Practitioner), to provide me with telemedicine health care services (the "Services") as deemed necessary by the treating Practitioner. I acknowledge and consent to receive the Services by the Practitioner via telemedicine. I understand that the telemedicine visit will involve communicating with the Practitioner through live audiovisual communication technology and the disclosure of certain medical  information by electronic transmission. I acknowledge that I have been given the opportunity to request an in-person assessment or other available alternative prior to the telemedicine visit and am voluntarily participating in the telemedicine visit.   I understand that I have the right to withhold or withdraw my consent to the use of telemedicine in the course of my care at any time, without affecting my right to future care or treatment, and that the Practitioner or I may terminate the telemedicine visit at any time. I understand that I have the right to inspect all information obtained and/or recorded in the course of the telemedicine visit and may receive copies of available information for a reasonable fee.  I understand that some of the potential risks of receiving the Services via telemedicine include:   Delay or interruption in medical evaluation due to technological equipment failure or disruption;  Information transmitted may not be sufficient (e.g. poor resolution of images) to allow for appropriate medical decision making by the Practitioner; and/or  In rare instances, security protocols could fail, causing a breach of personal health information.   Furthermore, I acknowledge that it is my responsibility to provide information about my medical history, conditions and care that is complete and accurate to the best of my ability. I acknowledge that Practitioner's advice, recommendations, and/or decision may be based on factors not within their control, such as incomplete or inaccurate data provided by me or distortions of diagnostic images or specimens that may result from electronic transmissions. I understand that the practice of medicine is not an exact science and that Practitioner makes no warranties or guarantees regarding treatment outcomes. I acknowledge that I will receive a copy of this consent concurrently upon execution via email to the email address I last provided but may also request  a printed copy by calling the office of the [facility].  I understand that my insurance will be billed for this visit.   I have read or had this consent read to me.  I understand the contents of this consent, which adequately explains the benefits and risks of the Services being provided via telemedicine.  I have been provided ample opportunity to ask questions regarding this consent and the Services and have had my questions answered to my satisfaction.  I give my informed consent for the services to be provided through the use of telemedicine in my medical care  By participating in this telemedicine visit I agree to the above.

## 2018-09-03 ENCOUNTER — Encounter: Payer: Self-pay | Admitting: Cardiology

## 2018-09-03 NOTE — Progress Notes (Signed)
Remote pacemaker transmission.   

## 2018-10-01 ENCOUNTER — Telehealth: Payer: Self-pay | Admitting: Interventional Cardiology

## 2018-10-01 NOTE — Telephone Encounter (Signed)
Spoke with son, DPR on file.  He is agreeable to do a virtual visit with his father on 6/1.  He has a Artist.  Advised CMA will call closer to appt and go over details.  Son verbalized understanding and was appreciative for call.

## 2018-10-06 ENCOUNTER — Telehealth: Payer: Self-pay | Admitting: Interventional Cardiology

## 2018-10-06 NOTE — Telephone Encounter (Signed)
Spoke with son, DPR on file.  States family members in with pt this weekend and called him to inform him both of pt's legs, feet and ankles are swollen, left worse than right.  Pt has an opened area on one leg that is weeping.  Son states has other blisters that have no opened yet.  No vitals.  Denies CP or SOB.  Is having some wheezing that started yesterday.  Elevation and compression stockings help.  Called pt about weight and he said he has lost weight since he stopped eating bread.  Has been holding at 160lbs for several days.  Pt not currently on a diuretic.  Advised I will send message to Dr. [name] for review.

## 2018-10-06 NOTE — Telephone Encounter (Signed)
  Pt c/o swelling: STAT is pt has developed SOB within 24 hours  1) How much weight have you gained and in what time span? Not sure  2) If swelling, where is the swelling located? Legs, ankles and feet, worse on the left side  3) Are you currently taking a fluid pill? no  4) Are you currently SOB? No, having some wheezing that started on 10/05/18  5) Do you have a log of your daily weights (if so, list)? no  6) Have you gained 3 pounds in a day or 5 pounds in a week? Not sure  7) Have you traveled recently? No  Patient has been elevating his feet and wearing compression socks. He does have places that are opening up on his skin and fluid is coming out of those wounds. Patient does have appt on 10/12/18 but family wonders if he needs to be seen sooner.

## 2018-10-07 ENCOUNTER — Encounter: Payer: Self-pay | Admitting: Physician Assistant

## 2018-10-07 MED ORDER — FUROSEMIDE 20 MG PO TABS: 20.0000 mg | ORAL_TABLET | Freq: Every day | ORAL | 3 refills | Status: DC

## 2018-10-07 NOTE — Telephone Encounter (Addendum)
Spoke with son and went over recommendations.  He is out of town so he is going to have someone call me to tell me where to send the medication so they can get it picked up.  Unable to bring pt to office today but will bring in tomorrow to see Melina Copa, PA-C.  Son denies any s/sx of COVID.  States pt hasn't been out of his home in a long time.  They have not checked his temp that he is aware of.

## 2018-10-07 NOTE — Telephone Encounter (Signed)
Needs to be seen by PCP, UC, or [place]. Need to r/o cellulitis.Cannthey get vital signs?Does he have fever? Needs labs: BNP, BMET, CBC Start Furosemide 40 mg once then 20 mg daily thereafter.Marland Kitchen

## 2018-10-07 NOTE — Telephone Encounter (Signed)
Follow up     Pts son is calling to follow up from yesterday    Please call

## 2018-10-07 NOTE — Progress Notes (Signed)
Cardiology Office Note    Date:  10/08/2018  ID:  Frank Elliott, DOB 1925/08/09, MRN [id number] PCP:  [name], MD  Cardiologist:  Sinclair Grooms, MD   Chief Complaint: swelling  History of Present Illness:  Frank Elliott is a 85 y.o. male with history of chronic combined CHF, nonobstructive CAD, 2nd degree AVB s/p PPM (followed by Dr. Curt Bears), persistent atrial fib (followed by afib clinic), HTN, pleural effusion CKD stage III, BPH, GERD, neuropathy who presents for evaluation of edema. He had a LHC in 01/2018 showing 30-40% mid-distal LAD, LVEDP 39mmHg. He underwent PPM at that time. Last echo was at that time, EF 45-50%, grade 2 DD, mildly dilated RV, severe LAE. Last labs 07/2018 Hgb 13.1, 02/2018 K 4.1, Cr 1.16, 2.1. He also has had persistent atrial fib, identified on his device interrogation since 07/2018. Given relatively asymptomatic nature, rate control and conservative therapy was advised per atrial fib clinic.  He is accompanied by his son-in-law today. He is a poor historian. Dr. [name] has mentioned some failure to thrive and cognitive dysfunction in the past. It sounds like he has had swelling for 2 weeks, left worse than right. His left leg is much more erythematous than the right. He reports he's had a longstanding scab on his left leg that he frequently picks at. It actually appears to be either a wound or a sore that had healed some time ago but has left some gray hyperpigmentation similar to an acne scar. He denies any SOB, palpitations or chest pain. He had had some wheezing but sounds clear today. He has not had any fevers or chills. Dr. [name] recommended yesterday to start Lasix 20mg  - taking 2 tablets on day 1 then 1 tablet daily. He has not started this yet.   Past Medical History:  Diagnosis Date   Arthritis    "just about all over" (02/05/2018)   AV block, [place]itz 2 02/03/2018   PPM placed 01/2018   BPH (benign prostatic hyperplasia)    Chronic combined  systolic and diastolic CHF (congestive heart failure) ([place])    Complication of anesthesia    "had hard time waking up a long time ago" (02/05/2018)   Cyst of kidney, acquired    "recently found; ? side" (02/05/2018)   GERD (gastroesophageal reflux disease)    History of hiatal hernia    Hypertension    Mild CAD    a. 30-40% LAD in 2019.   Neuropathy    NICM (nonischemic cardiomyopathy) (H[facility])    Persistent atrial fibrillation    Pleural effusion    dx 04/01/12   Skin cancer    "face, nose" (02/05/2018)   Syncope 02/03/2018    Past Surgical History:  Procedure Laterality Date   APPENDECTOMY     BACK SURGERY     CARDIAC CATHETERIZATION  02/05/2018   CATARACT EXTRACTION, BILATERAL Bilateral    CHOLECYSTECTOMY     JOINT REPLACEMENT     LEFT HEART CATH AND CORONARY ANGIOGRAPHY N/A 02/05/2018   Procedure: LEFT HEART CATH AND CORONARY ANGIOGRAPHY;  Surgeon: Belva Crome, MD;  Location: [facility];  Service: Cardiovascular;  Laterality: N/A;   LUMBAR DISC SURGERY  1980s   PACEMAKER IMPLANT N/A 02/06/2018   Procedure: PACEMAKER IMPLANT;  Surgeon: Constance Haw, MD;  Location: [facility];  Service: Cardiovascular;  Laterality: N/A;   SKIN CANCER EXCISION     "face, nose" (02/05/2018)   TONSILLECTOMY  TOTAL HIP ARTHROPLASTY Right 1990s/2000s   ULTRASOUND GUIDANCE FOR VASCULAR ACCESS  02/05/2018   Procedure: Ultrasound Guidance For Vascular Access;  Surgeon: Belva Crome, MD;  Location: [facility];  Service: Cardiovascular;;    Current Medications: Current Meds  Medication Sig   apixaban (ELIQUIS) 5 MG TABS tablet Take 1 tablet (5 mg total) by mouth 2 (two) times daily.   betamethasone dipropionate (DIPROLENE) 0.05 % cream Apply 1 application topically 2 (two) times daily as needed. unk   carvedilol (COREG) 6.25 MG tablet TAKE 1 TABLET BY MOUTH TWICE A DAY   fluticasone (FLONASE) 50 MCG/ACT nasal spray Place 1 spray into  both nostrils daily as needed for allergies.    furosemide (LASIX) 20 MG tablet Take 20 mg by mouth daily.   gabapentin (NEURONTIN) 300 MG capsule Take 300 mg by mouth 3 (three) times daily.   losartan (COZAAR) 100 MG tablet Take 1 tablet (100 mg total) by mouth daily.   omeprazole (PRILOSEC) 20 MG capsule Take 20 mg by mouth 2 (two) times daily.   Probiotic Product (ALIGN PO) Take 1 capsule by mouth daily.     Allergies:   Aspirin; Whiskey [alcohol]; Amoxicillin; Avelox [moxifloxacin hcl in nacl]; Lisinopril; Penicillins; Sulfa antibiotics; Tape; and Zithromax [azithromycin]   Social History   Socioeconomic History   Marital status: Widowed    Spouse name: Not on file   Number of children: Not on file   Years of education: Not on file   Highest education level: Not on file  Occupational History   Occupation: Retired  Scientist, product/process development strain: Not on file   Food insecurity:    Worry: Not on file    Inability: Not on Lexicographer needs:    Medical: Not on file    Non-medical: Not on file  Tobacco Use   Smoking status: Former Smoker   Smokeless tobacco: Never Used   Tobacco comment: "smoked & chewed  in teens & early 20's"  Substance and Sexual Activity   Alcohol use: Not Currently    Comment: "drank in teens & early 20's"   Drug use: Never   Sexual activity: Not on file  Lifestyle   Physical activity:    Days per week: Not on file    Minutes per session: Not on file   Stress: Not on file  Relationships   Social connections:    Talks on phone: Not on file    Gets together: Not on file    Attends religious service: Not on file    Active member of club or organization: Not on file    Attends meetings of clubs or organizations: Not on file    Relationship status: Not on file  Other Topics Concern   Not on file  Social History Narrative   He lives alone, son frequently goes with him to [place] appointments.  Other family  members help in his care.     Family History:  The patient's family history includes Heart disease in his father.  ROS:   Please see the history of present illness. He does mention he coughs when he eats sometimes. All other systems are reviewed and otherwise negative.    PHYSICAL EXAM:   VS:  BP 134/80    Pulse 76    Ht 5\' 10"  (1.778 m)    Wt 161 lb (73 kg)    SpO2 99%    BMI 23.10 kg/m   BMI: Body  mass index is 23.1 kg/m. GEN: Frail appearing WM in no acute distress HEENT: normocephalic, atraumatic Neck: no JVD, carotid bruits, or masses Cardiac: irregularly irregular, rate controlled; no murmurs, rubs, or gallops, 1+ edema on the L, trace edema on the right Respiratory:  clear to auscultation bilaterally, normal work of breathing GI: soft, nontender, nondistended, + BS MS: no deformity or atrophy Skin: no purulence. Left leg is more dusky/erythematous from upper mid shin down to ankle. On anterior left shin there is a small area about the size of a pen head that appears to be an old scab that is healed over with hyperpigmentation changes Neuro:  Oriented to self, place but requires assistance from son in law to aid in history - vague historian Psych: euthymic mood, full affect  Wt Readings from Last 3 Encounters:  10/08/18 161 lb (73 kg)  07/22/18 165 lb (74.8 kg)  05/25/18 162 lb (73.5 kg)      Studies/Labs Reviewed:   EKG:  EKG was ordered today and personally reviewed by me and demonstrates wide QRS rhythm appears atrial fib with V pacing and occasional PVCs, R axis deviation  Recent Labs: 02/03/2018: TSH 0.648 03/05/2018: ALT 13 03/08/2018: BUN 17; Creatinine, Ser 1.16; Magnesium 2.1; Potassium 4.1; Sodium 139 07/22/2018: Hemoglobin 13.1; Platelets 244   Lipid Panel No results found for: CHOL, TRIG, HDL, CHOLHDL, VLDL, LDLCALC, LDLDIRECT  Additional studies/ records that were reviewed today include: Summarized above   ASSESSMENT & PLAN:   1. Lower extremity  edema - this has been felt in the past to be a combination of venous insufficiency and heart disease. Compression has helped some. I am also concerned based on left leg dusky erythema and warmth there is a component of cellulitis. He has what appears to be an old scab on his left shin that looks like it has mostly healed without acute issue - I am not certain that this is acutely related. Given its chronicity I advised that he might discuss having it checked out by primary care with pathology. He has in tact pedal pulses bilaterally so will defer ABIs acutely but can be considered going forward if symptoms do not resolve. He is not having any significant leg pain to suggest acute limb ischemia and does not have any "sheet sign" with palpation to suggest gout. He appears non-toxic and generally well otherwise. Agree with plan for Lasix as outlined in phone notes, 40mg  today then 20mg  daily thereafter. Will update CBC, CMET, PBNP. Regarding concern for cellulitis, he has several medication allergies. Unfortunately given his memory it is challenging to assess the true validity of these allergies. Doxycycline was considered but given non-purulent appearance, would need to add beta hemolytic strep coverage per guidelines - I am hesitant to use cephalosporin given that we do not really know the severity of his prior penicillin reaction. Per UTD guidelines and Journal of Hospital Medicine, clindamycin 300mg  QID x 7 days was chosen. I will have my covering staff call him tomorrow afternoon to check on him to see how it looks. Will arrange f/u formal virtual visit in 1 week to reassess - son in law states son will assist. If edema has resolved, would suggest changing Lasix to PRN at that point. Of note he is fully anticoagulated with Eliquis already so DVT felt less likely. He was ruled out for this under similar circumstances in 2016. I reviewed plan with Dr. [name] who is in agreement. 2. Acute on chronic combined CHF -  Reviewed  2g sodium restriction, 2L fluid restriction with patient. His weight has been slowly decreasing when he is not eating bread. Follow with med changes above. 3. Persistent atrial fibrillation - this has been managed by the atrial fib clinic. Will also obtain labs pertaining to afib while he is here in addition to the above - TSH and Mg. 4. Essential HTN - controlled. Follow BP with addition of Lasix.  Disposition: F/u in 1 week for Virtual OV with myself or Dr. Benjaman Lobe team.  Medication Adjustments/Labs and Tests Ordered: Current medicines are reviewed at length with the patient today.  Concerns regarding medicines are outlined above. Medication changes, Labs and Tests ordered today are summarized above and listed in the Patient Instructions accessible in Encounters.   Signed, Charlie Pitter, PA-C  10/08/2018 3:55 PM    [facility], [place], [place]  [id number] Phone: [phone]; Fax: [phone]

## 2018-10-07 NOTE — Telephone Encounter (Signed)
Follow up    Frank Elliott is calling back     Please call

## 2018-10-07 NOTE — Telephone Encounter (Signed)
Spoke with Butch Penny and she asked that medication be sent to [facility] in [place].  They will not get to the pt's house until about 7 or 8pm, so they will give first dose to him in the morning and then plan to see [facility] tomorrow afternoon.  Daughter thanked for me the call.

## 2018-10-08 ENCOUNTER — Other Ambulatory Visit: Payer: Self-pay

## 2018-10-08 ENCOUNTER — Telehealth: Payer: Self-pay | Admitting: Physician Assistant

## 2018-10-08 ENCOUNTER — Encounter: Payer: Self-pay | Admitting: Physician Assistant

## 2018-10-08 ENCOUNTER — Encounter (INDEPENDENT_AMBULATORY_CARE_PROVIDER_SITE_OTHER): Payer: Self-pay

## 2018-10-08 ENCOUNTER — Ambulatory Visit (INDEPENDENT_AMBULATORY_CARE_PROVIDER_SITE_OTHER): Payer: Medicare Other | Admitting: Physician Assistant

## 2018-10-08 VITALS — BP 134/80 | HR 76 | Ht 70.0 in | Wt 161.0 lb

## 2018-10-08 DIAGNOSIS — I10 Essential (primary) hypertension: Secondary | ICD-10-CM

## 2018-10-08 DIAGNOSIS — I4819 Other persistent atrial fibrillation: Secondary | ICD-10-CM

## 2018-10-08 DIAGNOSIS — Z79899 Other long term (current) drug therapy: Secondary | ICD-10-CM

## 2018-10-08 DIAGNOSIS — R600 Localized edema: Secondary | ICD-10-CM | POA: Diagnosis not present

## 2018-10-08 DIAGNOSIS — I5043 Acute on chronic combined systolic (congestive) and diastolic (congestive) heart failure: Secondary | ICD-10-CM | POA: Diagnosis not present

## 2018-10-08 MED ORDER — CLINDAMYCIN HCL 300 MG PO CAPS: 300.0000 mg | ORAL_CAPSULE | Freq: Four times a day (QID) | ORAL | 0 refills | Status: DC

## 2018-10-08 NOTE — Telephone Encounter (Signed)
   JW, Can you please call Mr. Montanari family tomorrow afternoon to check on him to see how swelling and redness is? I would also like them to call us early next week (Mon/Tues) with an update as well. Given his history of C diff, we need to make sure he's tolerating the antibiotic well and seeing benefit from it. If he develops any diarrhea he needs to stop it and call us. His med rec says he is on Align probiotic and I would definitely continue. I also discussed our plan with Dr. [name]. [place] PA-C

## 2018-10-08 NOTE — Patient Instructions (Addendum)
Medication Instructions:  Your physician has recommended you make the following change in your medication:  1.  START Clindamycin 300 mg taking 1 tablet 4 times a day 2.  CONTINUE to take the Lasix as directed   If you need a refill on your cardiac medications before your next appointment, please call your pharmacy.   Lab work: TODAY: CMET, MAG, CBC, TSH, & PRO BNP  If you have labs (blood work) drawn today and your tests are completely normal, you will receive your results only by: Marland Kitchen MyChart Message (if you have MyChart) OR . A paper copy in the mail If you have any lab test that is abnormal or we need to change your treatment, we will call you to review the results.  Testing/Procedures: None ordered  Follow-Up: YOU HAVE BEEN SCHEDULED A VIRTUAL VIDEO VISIT 10/15/2018 AT 1:45.  YOUR CARDIOLOGY TEAM HAS ARRANGED FOR AN E-VISIT FOR YOUR APPOINTMENT - PLEASE REVIEW IMPORTANT INFORMATION BELOW SEVERAL DAYS PRIOR TO YOUR APPOINTMENT  Due to the recent COVID-19 pandemic, we are transitioning in-person office visits to tele-medicine visits in an effort to decrease unnecessary exposure to our patients, their families, and staff. These visits are billed to your insurance just like a normal visit is. We also encourage you to sign up for MyChart if you have not already done so. You will need a smartphone if possible. For patients that do not have this, we can still complete the visit using a regular telephone but do prefer a smartphone to enable video when possible. You may have a family member that lives with you that can help. If possible, we also ask that you have a blood pressure cuff and scale at home to measure your blood pressure, heart rate and weight prior to your scheduled appointment. Patients with clinical needs that need an in-person evaluation and testing will still be able to come to the office if absolutely necessary. If you have any questions, feel free to call our office.     YOUR  PROVIDER WILL BE USING THE FOLLOWING PLATFORM TO COMPLETE YOUR VISIT: Doxy.Me  . IF USING DOXIMITY or DOXY.ME - The staff will give you instructions on receiving your link to join the meeting the day of your visit.   THE DAY OF YOUR APPOINTMENT  Approximately 15 minutes prior to your scheduled appointment, you will receive a telephone call from one of [facility] team - your caller ID may say "Unknown caller."  Our staff will confirm medications, vital signs for the day and any symptoms you may be experiencing. Please have this information available prior to the time of visit start. It may also be helpful for you to have a pad of paper and pen handy for any instructions given during your visit. They will also walk you through joining the smartphone meeting if this is a video visit.  ONCE THE CALL IS FINISHED, THE PROVIDER WILL SEND A TEXT TO THE [phone] #.  OPEN THE TEXT AND CLICK ON THE LINK.  TYPE IN PTS NAME TO CHECK IN.  MAKE SURE TO ALLOW VIDEO/MIC CAPABILITIES. ONCE THAT IS DONE, IT WILL PUT YOU IN TO A VIRTUAL WAITING ROOM. ONCE WE SEE THAT YOU HAVE JOINED, WE WILL BEGIN THE VISIT.      CONSENT FOR TELE-HEALTH VISIT - PLEASE REVIEW  I hereby voluntarily request, consent and authorize [facility] and its employed or contracted physicians, physician assistants, nurse practitioners or other licensed health care professionals (the Practitioner), to provide me with  telemedicine health care services (the "Services") as deemed necessary by the treating Practitioner. I acknowledge and consent to receive the Services by the Practitioner via telemedicine. I understand that the telemedicine visit will involve communicating with the Practitioner through live audiovisual communication technology and the disclosure of certain medical information by electronic transmission. I acknowledge that I have been given the opportunity to request an in-person assessment or other available alternative prior to the  telemedicine visit and am voluntarily participating in the telemedicine visit.  I understand that I have the right to withhold or withdraw my consent to the use of telemedicine in the course of my care at any time, without affecting my right to future care or treatment, and that the Practitioner or I may terminate the telemedicine visit at any time. I understand that I have the right to inspect all information obtained and/or recorded in the course of the telemedicine visit and may receive copies of available information for a reasonable fee.  I understand that some of the potential risks of receiving the Services via telemedicine include:  Marland Kitchen Delay or interruption in medical evaluation due to technological equipment failure or disruption; . Information transmitted may not be sufficient (e.g. poor resolution of images) to allow for appropriate medical decision making by the Practitioner; and/or  . In rare instances, security protocols could fail, causing a breach of personal health information.  Furthermore, I acknowledge that it is my responsibility to provide information about my medical history, conditions and care that is complete and accurate to the best of my ability. I acknowledge that Practitioner's advice, recommendations, and/or decision may be based on factors not within their control, such as incomplete or inaccurate data provided by me or distortions of diagnostic images or specimens that may result from electronic transmissions. I understand that the practice of medicine is not an exact science and that Practitioner makes no warranties or guarantees regarding treatment outcomes. I acknowledge that I will receive a copy of this consent concurrently upon execution via email to the email address I last provided but may also request a printed copy by calling the office of [facility].    I understand that my insurance will be billed for this visit.   I have read or had this consent read to  me. . I understand the contents of this consent, which adequately explains the benefits and risks of the Services being provided via telemedicine.  . I have been provided ample opportunity to ask questions regarding this consent and the Services and have had my questions answered to my satisfaction. . I give my informed consent for the services to be provided through the use of telemedicine in my medical care  By participating in this telemedicine visit I agree to the above.  Any Other Special Instructions Will Be Listed Below (If Applicable).

## 2018-10-09 LAB — COMPREHENSIVE METABOLIC PANEL
ALT: 16 IU/L (ref 0–44)
AST: 16 IU/L (ref 0–40)
Albumin/Globulin Ratio: 1.6 (ref 1.2–2.2)
Albumin: 3.9 g/dL (ref 3.5–4.6)
Alkaline Phosphatase: 106 IU/L (ref 39–117)
BUN/Creatinine Ratio: 22 (ref 10–24)
BUN: 29 mg/dL (ref 10–36)
Bilirubin Total: 1.1 mg/dL (ref 0.0–1.2)
CO2: 22 mmol/L (ref 20–29)
Calcium: 9.5 mg/dL (ref 8.6–10.2)
Chloride: 105 mmol/L (ref 96–106)
Creatinine, Ser: 1.32 mg/dL — ABNORMAL HIGH (ref 0.76–1.27)
GFR calc Af Amer: 55 mL/min/{1.73_m2} — ABNORMAL LOW (ref >59)
GFR calc non Af Amer: 47 mL/min/{1.73_m2} — ABNORMAL LOW (ref >59)
Globulin, Total: 2.4 g/dL (ref 1.5–4.5)
Glucose: 96 mg/dL (ref 65–99)
Potassium: 5.3 mmol/L — ABNORMAL HIGH (ref 3.5–5.2)
Sodium: 144 mmol/L (ref 134–144)
Total Protein: 6.3 g/dL (ref 6.0–8.5)

## 2018-10-09 LAB — CBC
Hematocrit: 40.7 % (ref 37.5–51.0)
Hemoglobin: 14.2 g/dL (ref 13.0–17.7)
MCH: 34.2 pg — ABNORMAL HIGH (ref 26.6–33.0)
MCHC: 34.9 g/dL (ref 31.5–35.7)
MCV: 98 fL — ABNORMAL HIGH (ref 79–97)
Platelets: 218 10*3/uL (ref 150–450)
RBC: 4.15 x10E6/uL (ref 4.14–5.80)
RDW: 12.1 % (ref 11.6–15.4)
WBC: 5.3 10*3/uL (ref 3.4–10.8)

## 2018-10-09 LAB — MAGNESIUM: Magnesium: 2.4 mg/dL — ABNORMAL HIGH (ref 1.6–2.3)

## 2018-10-09 LAB — TSH: TSH: 1.41 u[IU]/mL (ref 0.450–4.500)

## 2018-10-09 LAB — PRO B NATRIURETIC PEPTIDE: NT-Pro BNP: 3397 pg/mL — ABNORMAL HIGH (ref 0–486)

## 2018-10-09 NOTE — Telephone Encounter (Signed)
-----   Message from Charlie Pitter, [place] sent at 10/09/2018  7:54 AM EDT ----- Please let patient/DPR (pt has memory issues) know labs showed kidney function appears relatively similar to baseline. Potassium level is a little elevated but I suspect that starting Lasix will help this. Would recommend he limit excessive potassium intake from diet if he's been taking in more sources than usual. Make sure he's not taking any potassium supplement. BNP elevated suggestive of component of CHF, which supports the need for the diuretic. Would advise getting a BMET on Mon to trend to make sure potassium is still not elevated. If it is we might need to change losartan. Otherwise continue plan as discussed. Dayna Dunn PA-C

## 2018-10-09 NOTE — Telephone Encounter (Signed)
Called Frank Elliott, DPR on file.  He asked me to call him back later, as he was travelling.

## 2018-10-12 ENCOUNTER — Telehealth: Payer: Medicare Other | Admitting: Interventional Cardiology

## 2018-10-12 ENCOUNTER — Other Ambulatory Visit: Payer: Self-pay

## 2018-10-12 ENCOUNTER — Other Ambulatory Visit: Payer: Medicare Other | Admitting: *Deleted

## 2018-10-12 DIAGNOSIS — Z79899 Other long term (current) drug therapy: Secondary | ICD-10-CM | POA: Diagnosis not present

## 2018-10-12 LAB — BASIC METABOLIC PANEL
BUN/Creatinine Ratio: 23 (ref 10–24)
BUN: 33 mg/dL (ref 10–36)
CO2: 27 mmol/L (ref 20–29)
Calcium: 9.4 mg/dL (ref 8.6–10.2)
Chloride: 107 mmol/L — ABNORMAL HIGH (ref 96–106)
Creatinine, Ser: 1.46 mg/dL — ABNORMAL HIGH (ref 0.76–1.27)
GFR calc Af Amer: 48 mL/min/{1.73_m2} — ABNORMAL LOW (ref >59)
GFR calc non Af Amer: 42 mL/min/{1.73_m2} — ABNORMAL LOW (ref >59)
Glucose: 96 mg/dL (ref 65–99)
Potassium: 5.1 mmol/L (ref 3.5–5.2)
Sodium: 142 mmol/L (ref 134–144)

## 2018-10-13 ENCOUNTER — Telehealth: Payer: Self-pay | Admitting: *Deleted

## 2018-10-13 MED ORDER — LOSARTAN POTASSIUM 100 MG PO TABS: 50.0000 mg | ORAL_TABLET | Freq: Every day | ORAL | 3 refills | Status: DC

## 2018-10-13 NOTE — Telephone Encounter (Signed)
-----   Message from Charlie Pitter, [place] sent at 10/12/2018  4:48 PM EDT ----- Please call pt/family. His potassium remains upper limits of normal and Cr has bumped up slightly (kidney function) although still pretty close with where he has been in the past. To avoid his K running too high would advise he cut his losartan in half for now. Keep f/u as planned  Dayna Dunn PA-C

## 2018-10-14 NOTE — Progress Notes (Signed)
Virtual Visit via Video Note   This visit type was conducted due to national recommendations for restrictions regarding the COVID-19 Pandemic (e.g. social distancing) in an effort to limit this patient's exposure and mitigate transmission in our community.  Due to his co-morbid illnesses, this patient is at least at moderate risk for complications without adequate follow up.  This format is felt to be most appropriate for this patient at this time.  All issues noted in this document were discussed and addressed.  A limited physical exam was performed with this format.  Please refer to the patient's chart for his consent to telehealth for [facility].   Date:  10/15/2018   ID:  Frank Elliott, DOB September 08, 1925, MRN [id number]  Patient Location: Home Provider Location: Home  PCP:  Frank Melter, MD  Cardiologist:  Frank Grooms, MD  Electrophysiologist:  Frank Haw, MD   Evaluation Performed:  Follow-Up Visit  Chief Complaint: 1 week follow-up for LE swelling, seen for Frank Elliott.   History of Present Illness:    Frank Elliott is a 85 y.o. male with with history of chronic combined CHF, nonobstructive CAD, 2nd degree AVB s/p PPM (followed by Dr. Curt Elliott), persistent atrial fib (followed by afib clinic), HTN, pleural effusion CKD stage III, BPH, GERD, neuropathy who presents for evaluation of edema. He had a LHC in 01/2018 showing 30-40% mid-distal LAD, LVEDP 95mHg. He underwent PPM at that time. Last echo was at that time, EF 45-50%, grade 2 DD, mildly dilated RV, severe LAE.  He also has had persistent atrial fib, identified on his device interrogation since 07/2018. Given relatively asymptomatic nature, rate control and conservative therapy was advised per atrial fib clinic.  [name] being seen today for a one-week follow-up after a recent appointment on 10/08/2022 for lower extremity edema.  He was previously seen by Frank Elliott with a 2-week history of LE swelling left  greater than right. He denied shortness of breath, palpitations or chest pain.  He had some reports of wheezing but per chart review, lungs were clear at that time.  Patient's family had called to the office prior to the visit and recommendations were to start Lasix 20 mg, taking 2 tablets on day 1 then decreasing to 1 tablet daily per Dr. STamala Julianhowever this had not been started as of his last office visit.  In the past this was felt to be a combination of venous insufficiency and heart disease.  There was some concern for cellulitis given appearance of lower extremities with recommendations to follow with PCP with pathology.  ABIs were deferred secondary to adequate pulses. Plan going further was outlined per Dr. STamala Julianwith Lasix 40 mg on 10/08/2018 then 20 mg daily thereafter.  Additionally, he was started on clindamycin 300 mg 4 times daily for 7 days for questionable cellulitis. He was noted to be anticoagulated with Eliquis for DVT was to be less likely source.  Today I met [name] video encounter.  His son is at his side to assist with our visit.  As above, he is seen for a one-week follow-up of left greater than right lower extremity swelling.  Both son and Mr. VVoorhiesstate that the swelling is improved but obvious that it is still there.  Son used telephone to assist with physical examination of lower extremities.  Patient denies shortness of breath, pain in his lower extremities, chest pain, PND, orthopnea or syncope.  He reports medication  compliance with Lasix.  He states he has been urinating well.  He continues to wear bilateral compression stockings and elevates his legs with a wedge while watching TV.  He completed his last day of clindamycin for questionable cellulitis.  We discussed follow-up with PCP however, son states that when he has contacted them in the past given the patient's advanced age they are reluctant to see him in the office.  During our discussion, both son and Frank Elliott are  adamant about being seen in the office for physical exam.  They would prefer to be seen by Frank Elliott himself.  I will discuss with my team members to have him seen for close follow-up in office to assess the continued swelling.  In the meantime we will make medication adjustments and plans for follow-up lab as outlined below.  The patient does not have symptoms concerning for COVID-19 infection (fever, chills, cough, or new shortness of breath).   Past Medical History:  Diagnosis Date   Arthritis    "just about all over" (02/05/2018)   AV block, Mobitz 2 02/03/2018   PPM placed 01/2018   BPH (benign prostatic hyperplasia)    Chronic combined systolic and diastolic CHF (congestive heart failure) ([place])    Complication of anesthesia    "had hard time waking up a long time ago" (02/05/2018)   Cyst of kidney, acquired    "recently found; ? side" (02/05/2018)   GERD (gastroesophageal reflux disease)    History of hiatal hernia    Hypertension    Mild CAD    a. 30-40% LAD in 2019.   Neuropathy    NICM (nonischemic cardiomyopathy) (HCC)    Persistent atrial fibrillation    Pleural effusion    dx 04/01/12   Skin cancer    "face, nose" (02/05/2018)   Syncope 02/03/2018   Past Surgical History:  Procedure Laterality Date   APPENDECTOMY     BACK SURGERY     CARDIAC CATHETERIZATION  02/05/2018   CATARACT EXTRACTION, BILATERAL Bilateral    CHOLECYSTECTOMY     JOINT REPLACEMENT     LEFT HEART CATH AND CORONARY ANGIOGRAPHY N/A 02/05/2018   Procedure: LEFT HEART CATH AND CORONARY ANGIOGRAPHY;  Surgeon: Belva Crome, MD;  Location: [place] [facility];  Service: Cardiovascular;  Laterality: N/A;   LUMBAR DISC SURGERY  1980s   PACEMAKER IMPLANT N/A 02/06/2018   Procedure: PACEMAKER IMPLANT;  Surgeon: Frank Haw, MD;  Location: [place] [facility];  Service: Cardiovascular;  Laterality: N/A;   SKIN CANCER EXCISION     "face, nose" (02/05/2018)   TONSILLECTOMY       TOTAL HIP ARTHROPLASTY Right 1990s/2000s   ULTRASOUND GUIDANCE FOR VASCULAR ACCESS  02/05/2018   Procedure: Ultrasound Guidance For Vascular Access;  Surgeon: Belva Crome, MD;  Location: [place] [facility];  Service: Cardiovascular;;     Current Meds  Medication Sig   apixaban (ELIQUIS) 5 MG TABS tablet Take 1 tablet (5 mg total) by mouth 2 (two) times daily.   betamethasone dipropionate (DIPROLENE) 0.05 % cream Apply 1 application topically 2 (two) times daily as needed. unk   carvedilol (COREG) 6.25 MG tablet TAKE 1 TABLET BY MOUTH TWICE A DAY   clindamycin (CLEOCIN) 300 MG capsule Take 1 capsule (300 mg total) by mouth 4 (four) times daily.   fluticasone (FLONASE) 50 MCG/ACT nasal spray Place 1 spray into both nostrils daily as needed for allergies.    furosemide (LASIX) 20 MG tablet Take  20 mg by mouth daily.   gabapentin (NEURONTIN) 300 MG capsule Take 300 mg by mouth 3 (three) times daily.   losartan (COZAAR) 100 MG tablet Take 0.5 tablets (50 mg total) by mouth daily.   omeprazole (PRILOSEC) 20 MG capsule Take 20 mg by mouth 2 (two) times daily.   Probiotic Product (ALIGN PO) Take 1 capsule by mouth daily.     Allergies:   Aspirin; Whiskey [alcohol]; Amoxicillin; Avelox [moxifloxacin hcl in nacl]; Lisinopril; Penicillins; Sulfa antibiotics; Tape; and Zithromax [azithromycin]   Social History   Tobacco Use   Smoking status: Former Smoker   Smokeless tobacco: Never Used   Tobacco comment: "smoked & chewed  in teens & early 20's"  Substance Use Topics   Alcohol use: Not Currently    Comment: "drank in teens & early 20's"   Drug use: Never     Family Hx: The patient's family history includes Heart disease in his father.  ROS:   Please see the history of present illness.     All other systems reviewed and are negative.  Prior CV studies:   The following studies were reviewed today:  Cardiac catheterization 02/05/2018:   Right dominant coronary  anatomy  Ectatic left main without obstruction.  Luminal irregularities are noted  LAD contains ectasia proximally and in the mid to distal vessel there is eccentric segmental 30 to 40% narrowing  The circumflex artery gives origin to 3 marginals with the second marginal being the largest.  Luminal irregularities are noted  The RCA is widely patent with luminal irregularities.  Left ventricular end-diastolic pressure is normal at 15 mmHg.  Ventriculography was not performed because of mild chronic kidney disease.  Total contrast, 40 cc  RECOMMENDATIONS:   No coronary disease and no anatomic variants that would imply that AV block is related to ischemia.  EP evaluation for consideration of DDD pacemaker.  If pacemaker is inserted, resume the patient's beta-blocker therapy as a part of guideline directed medical therapy for systolic dysfunction.  No indication for antiplatelet therapy at this time.  Echocardiogram 02/04/2018: Study Conclusions  - Left ventricle: The cavity size was normal. Wall thickness was   normal. Systolic function was mildly reduced. The estimated   ejection fraction was in the range of 45% to 50%. Basal to mid   inferior hypokinesis. Doppler parameters are consistent with   pseudonormal left ventricular relaxation (grade 2 diastolic   dysfunction). The E/e&' ratio is >15, suggesting elevated LV   filling pressure. - Left atrium: The atrium was normal in size. - Right ventricle: The cavity size was mildly dilated. - Right atrium: Severely dilated. - Tricuspid valve: Could not measure TR jet.  Impressions:  - Compared to a prior study in 2018, the LVEF is lower at 45-50%   with basal to mid inferior wall hypokinesis, grade 2 DD and   elevated LV filling pressure.   Labs/Other Tests and Data Reviewed:    EKG:  An ECG dated 10/08/2018 was personally reviewed today and demonstrated:  Atrial fibrillation with V pacing, PVCs  Recent  Labs: 10/08/2018: ALT 16; Hemoglobin 14.2; Magnesium 2.4; NT-Pro BNP 3,397; Platelets 218; TSH 1.410 10/12/2018: BUN 33; Creatinine, Ser 1.46; Potassium 5.1; Sodium 142   Recent Lipid Panel No results found for: CHOL, TRIG, HDL, CHOLHDL, LDLCALC, LDLDIRECT  Wt Readings from Last 3 Encounters:  10/15/18 157 lb 6.4 oz (71.4 kg)  10/08/18 161 lb (73 kg)  07/22/18 165 lb (74.8 kg)     Objective:  Vital Signs:  BP 114/76    Pulse 73    Ht '5\' 9"'$  (1.753 m)    Wt 157 lb 6.4 oz (71.4 kg)    BMI 23.24 kg/m    VITAL SIGNS:  reviewed GEN:  no acute distress EYES:  sclerae anicteric, EOMI - Extraocular Movements Intact RESPIRATORY:  normal respiratory effort, symmetric expansion CARDIOVASCULAR: L greater than R peripheral edema SKIN:  Mid shin ulcer MUSCULOSKELETAL:  no obvious deformities. NEURO:  alert and oriented x 3, no obvious focal deficit PSYCH:  normal affect  ASSESSMENT & PLAN:    1.  Lower extremity edema: -Continues to have left greater than right LE swelling with mild discolorations in ankles and toes -Consider ABIs based on physical exam on follow up. -Patient and family report improvement but still swollen -Continues to wear compression stockings, elevating with wedge -Hard to determine whether this is from heart failure versus venous stasis via virtual visit.  -Denies shortness of breath, orthopnea, PND -Will repeat 40 mg daily starting tomorrow 10/16/2018 for 1 day then continue 20 mg daily thereafter -We will repeat lab work at follow-up office visit, preferably with Frank Elliott next Thursday. Pt and family adamant about follow-up in office visit for physical exam -Low suspicion for DVT secondary to Eliquis compliance -Last K+ stable however if patient remains on persistent Lasix, consider the addition of K-Dur 20 mEq daily  2.  Acute on chronic combined CHF: -2 g sodium restriction diet and 2 L fluid restriction reviewed with patient on last office visit>> reinforced  today -proBNP on last office visit 3397 -Weight today, 157lb -Weight was 161lb on last visit -As above, will repeat cycle of 40 mg Lasix daily for 1 day starting 10/16/2018 then proceed with 20 mg/day thereafter with close follow-up labs next Thursday -Consider repeating BNP, as well as albumin level    3.  Persistent atrial fibrillation: -Managed per atrial fibrillation clinic -TSH, magnesium drawn at last office visit with results of 1.41, 2.4 -Continue Eliquis 5 mg twice daily>>>consider reduced dose of 2.5 mg given last creatinine of 1.46 and age.   4.  Essential hypertension: -Controlled, -Following with the addition of Lasix -Continue carvedilol 6.25 mg, losartan 50 mg, Lasix 20 mg  5.  CKD Stage II: -Creatinine 1.46 on 10/12/2018 -Baseline appears to be in the 1.3-1.4 range -Repeat lab work next week given medication changes   COVID-19 Education: The signs and symptoms of COVID-19 were discussed with the patient and how to seek care for testing (follow up with PCP or arrange E-visit). The importance of social distancing was discussed today.  Time:   Today, I have spent 20 minutes with the patient with telehealth technology discussing the above problems.     Medication Adjustments/Labs and Tests Ordered: Current medicines are reviewed at length with the patient today.  Concerns regarding medicines are outlined above.   Tests Ordered: No orders of the defined types were placed in this encounter.   Medication Changes: No orders of the defined types were placed in this encounter.   Disposition:  Follow up With app or Frank Elliott in several days  Signed, Kathyrn Drown, NP  10/15/2018 2:20 PM    [place]

## 2018-10-15 ENCOUNTER — Encounter: Payer: Self-pay | Admitting: Cardiology

## 2018-10-15 ENCOUNTER — Telehealth: Payer: Self-pay

## 2018-10-15 ENCOUNTER — Telehealth: Payer: Self-pay | Admitting: Cardiology

## 2018-10-15 ENCOUNTER — Other Ambulatory Visit: Payer: Self-pay

## 2018-10-15 ENCOUNTER — Telehealth (INDEPENDENT_AMBULATORY_CARE_PROVIDER_SITE_OTHER): Payer: Medicare Other | Admitting: Cardiology

## 2018-10-15 VITALS — BP 114/76 | HR 73 | Ht 69.0 in | Wt 157.4 lb

## 2018-10-15 DIAGNOSIS — Z79899 Other long term (current) drug therapy: Secondary | ICD-10-CM

## 2018-10-15 DIAGNOSIS — R600 Localized edema: Secondary | ICD-10-CM

## 2018-10-15 DIAGNOSIS — I10 Essential (primary) hypertension: Secondary | ICD-10-CM

## 2018-10-15 DIAGNOSIS — I4819 Other persistent atrial fibrillation: Secondary | ICD-10-CM

## 2018-10-15 NOTE — Telephone Encounter (Signed)

## 2018-10-15 NOTE — Patient Instructions (Addendum)
Medication Instructions:  Your physician has recommended you make the following change in your medication: 1. TAKE AN EXTRA LASIX (40 mg) TOMORROW 10/16/18 AND 20 MG DAILY STARTING Saturday 10/17/18.  If you need a refill on your cardiac medications before your next appointment, please call your pharmacy.   Lab work: NONE If you have labs (blood work) drawn today and your tests are completely normal, you will receive your results only by: Marland Kitchen MyChart Message (if you have MyChart) OR . A paper copy in the mail If you have any lab test that is abnormal or we need to change your treatment, we will call you to review the results.  Testing/Procedures: NONE  Follow-Up: . Your physician recommends that you  follow-up WITH VIN BHGAT, PA-C ON Thursday, 10/22/18 AT 10:45 A.M.

## 2018-10-15 NOTE — Telephone Encounter (Signed)
New message:   Patient calling to get instructions on how to set the video up for his appt today. Please call patient.

## 2018-10-22 ENCOUNTER — Other Ambulatory Visit: Payer: Self-pay

## 2018-10-22 ENCOUNTER — Encounter: Payer: Self-pay | Admitting: Physician Assistant

## 2018-10-22 ENCOUNTER — Ambulatory Visit (INDEPENDENT_AMBULATORY_CARE_PROVIDER_SITE_OTHER): Payer: Medicare Other | Admitting: Physician Assistant

## 2018-10-22 VITALS — BP 122/70 | HR 70 | Ht 69.5 in | Wt 156.4 lb

## 2018-10-22 DIAGNOSIS — R600 Localized edema: Secondary | ICD-10-CM | POA: Diagnosis not present

## 2018-10-22 DIAGNOSIS — I5043 Acute on chronic combined systolic (congestive) and diastolic (congestive) heart failure: Secondary | ICD-10-CM

## 2018-10-22 DIAGNOSIS — I4819 Other persistent atrial fibrillation: Secondary | ICD-10-CM | POA: Diagnosis not present

## 2018-10-22 MED ORDER — FUROSEMIDE 20 MG PO TABS: ORAL_TABLET | ORAL | 5 refills | Status: DC

## 2018-10-22 NOTE — Progress Notes (Signed)
Cardiology Office Note    Date:  10/22/2018   ID:  Frank Elliott, DOB 1925/07/18, MRN [id number]  PCP:  [name], MD  Cardiologist:  Sinclair Grooms, MD  Electrophysiologist:  Constance Haw, MD   Chief Complaint: 1 week follow up  History of Present Illness:   Frank Elliott is a 85 y.o. male with history ofchronic combined CHF, nonobstructive CAD, 2nd degree AVB s/p PPM (followed by Dr. Raechel Ache (followed by afib clinic), HTN, pleural effusion CKD stage III, BPH, GERD, neuropathy seen for follow up.   He had a LHC in 01/2018 showing 30-40% mid-distal LAD, LVEDP 12mmHg. He underwent PPM at that time. Last echo was at that time, EF 45-50%, grade 2 DD, mildly dilated RV, severe LAE.  He also has had persistent atrial fib, identified on his device interrogationsince 07/2018. Given relatively asymptomatic nature, rate controlandconservative therapy was advised per atrial fib clinic.  Recently dealing with LE edema. BNP was > 3000. Improved with diuretics. Low suspicion for DVT secondary to Eliquis compliance.  Here today for follow up. His edema improved on higher dose on lasix but it came back with lasix 20mg  daily. Compliant with low sodium diet, stocking and leg elevation. Denies orthopnea, PND, chest pain, dyspnea.   No COVID symptoms.    Past Medical History:  Diagnosis Date  . Arthritis    "just about all over" (02/05/2018)  . AV block, Mobitz 2 02/03/2018   PPM placed 01/2018  . BPH (benign prostatic hyperplasia)   . Chronic combined systolic and diastolic CHF (congestive heart failure) ([place])   . Complication of anesthesia    "had hard time waking up a long time ago" (02/05/2018)  . Cyst of kidney, acquired    "recently found; ? side" (02/05/2018)  . GERD (gastroesophageal reflux disease)   . History of hiatal hernia   . Hypertension   . Mild CAD    a. 30-40% LAD in 2019.  Marland Kitchen Neuropathy   . NICM (nonischemic cardiomyopathy) ([place])    . Persistent atrial fibrillation   . Pleural effusion    dx 04/01/12  . Skin cancer    "face, nose" (02/05/2018)  . Syncope 02/03/2018    Past Surgical History:  Procedure Laterality Date  . APPENDECTOMY    . BACK SURGERY    . CARDIAC CATHETERIZATION  02/05/2018  . CATARACT EXTRACTION, BILATERAL Bilateral   . CHOLECYSTECTOMY    . JOINT REPLACEMENT    . LEFT HEART CATH AND CORONARY ANGIOGRAPHY N/A 02/05/2018   Procedure: LEFT HEART CATH AND CORONARY ANGIOGRAPHY;  Surgeon: Belva Crome, MD;  Location: [facility];  Service: Cardiovascular;  Laterality: N/A;  . LUMBAR [place] SURGERY  1980s  . PACEMAKER IMPLANT N/A 02/06/2018   Procedure: PACEMAKER IMPLANT;  Surgeon: Constance Haw, MD;  Location: [facility];  Service: Cardiovascular;  Laterality: N/A;  . SKIN CANCER EXCISION     "face, nose" (02/05/2018)  . TONSILLECTOMY    . TOTAL HIP ARTHROPLASTY Right 1990s/2000s  . ULTRASOUND GUIDANCE FOR VASCULAR ACCESS  02/05/2018   Procedure: Ultrasound Guidance For Vascular Access;  Surgeon: Belva Crome, MD;  Location: [facility] [facility];  Service: Cardiovascular;;    Current Medications: Prior to Admission medications   Medication Sig Start Date End Date Taking? Authorizing Provider  apixaban (ELIQUIS) 5 MG TABS tablet Take 1 tablet (5 mg total) by mouth 2 (two) times daily. 05/25/18   Camnitz, Ocie Doyne, MD  betamethasone dipropionate (DIPROLENE) 0.05 % cream Apply 1 application topically 2 (two) times daily as needed. unk 01/22/18   [provider]  carvedilol (COREG) 6.25 MG tablet TAKE 1 TABLET BY MOUTH TWICE A DAY 07/21/18   Belva Crome, MD  clindamycin (CLEOCIN) 300 MG capsule Take 1 capsule (300 mg total) by mouth 4 (four) times daily. 10/08/18   Dunn, Nedra Hai, PA-C  fluticasone (FLONASE) 50 MCG/ACT nasal spray Place 1 spray into both nostrils daily as needed for allergies.  07/27/13   [provider]  furosemide (LASIX) 20 MG tablet Take 20 mg by  mouth daily.    [provider]  gabapentin (NEURONTIN) 300 MG capsule Take 300 mg by mouth 3 (three) times daily.    [provider]  losartan (COZAAR) 100 MG tablet Take 0.5 tablets (50 mg total) by mouth daily. 10/13/18 01/11/19  Dunn, Nedra Hai, PA-C  omeprazole (PRILOSEC) 20 MG capsule Take 20 mg by mouth 2 (two) times daily.    [provider]  Probiotic Product (ALIGN PO) Take 1 capsule by mouth daily.    [provider]    Allergies:   Aspirin, Whiskey [alcohol], Amoxicillin, Avelox [moxifloxacin hcl in nacl], Lisinopril, Penicillins, Sulfa antibiotics, Tape, and Zithromax [azithromycin]   Social History   Socioeconomic History  . Marital status: Widowed    Spouse name: Not on file  . Number of children: Not on file  . Years of education: Not on file  . Highest education level: Not on file  Occupational History  . Occupation: Retired  Scientific laboratory technician  . Financial resource strain: Not on file  . Food insecurity    Worry: Not on file    Inability: Not on file  . Transportation needs    Medical: Not on file    Non-medical: Not on file  Tobacco Use  . Smoking status: Former Research scientist (life sciences)  . Smokeless tobacco: Never Used  . Tobacco comment: "smoked & chewed  in teens & early 20's"  Substance and Sexual Activity  . Alcohol use: Not Currently    Comment: "drank in teens & early 20's"  . Drug use: Never  . Sexual activity: Not on file  Lifestyle  . Physical activity    Days per week: Not on file    Minutes per session: Not on file  . Stress: Not on file  Relationships  . Social Herbalist on phone: Not on file    Gets together: Not on file    Attends religious service: Not on file    Active member of club or organization: Not on file    Attends meetings of clubs or organizations: Not on file    Relationship status: Not on file  Other Topics Concern  . Not on file  Social History Narrative   He lives alone, son frequently goes with him  to doctor's appointments.  Other family members help in his care.     Family History:  The patient's family history includes Heart disease in his father.  ROS:   Please see the history of present illness.    ROS All other systems reviewed and are negative.   PHYSICAL EXAM:   VS:  BP 122/70   Pulse 70   Ht 5' 9.5" (1.765 m)   Wt 156 lb 6.4 oz (70.9 kg)   SpO2 99%   BMI 22.77 kg/m    GEN: Well nourished, well developed, in no acute distress  HEENT: normal  Neck: no JVD, carotid bruits, or masses Cardiac:Irregular; no murmurs, rubs, or gallops, 2+ LLE edema and 1+ RLE edema with venous stasis  Respiratory:  clear to auscultation bilaterally, normal work of breathing GI: soft, nontender, nondistended, + BS MS: no deformity or atrophy  Skin: warm and dry, no rash Neuro:  Alert and Oriented x 3, Strength and sensation are intact Psych: euthymic mood, full affect  Wt Readings from Last 3 Encounters:  10/22/18 156 lb 6.4 oz (70.9 kg)  10/15/18 157 lb 6.4 oz (71.4 kg)  10/08/18 161 lb (73 kg)      Studies/Labs Reviewed:   EKG:  EKG is ordered today.  The ekg ordered today demonstrates wide QRS rhythm (likely atrial fib)  with V pacing and occasional PVCs  Recent Labs: 10/08/2018: ALT 16; Hemoglobin 14.2; Magnesium 2.4; NT-Pro BNP 3,397; Platelets 218; TSH 1.410 10/12/2018: BUN 33; Creatinine, Ser 1.46; Potassium 5.1; Sodium 142   Lipid Panel No results found for: CHOL, TRIG, HDL, CHOLHDL, VLDL, LDLCALC, LDLDIRECT  Additional studies/ records that were reviewed today include:   Echocardiogram: 01/2018 Study Conclusions  - Left ventricle: The cavity size was normal. Wall thickness was   normal. Systolic function was mildly reduced. The estimated   ejection fraction was in the range of 45% to 50%. Basal to mid   inferior hypokinesis. Doppler parameters are consistent with   pseudonormal left ventricular relaxation (grade 2 diastolic   dysfunction). The E/e&' ratio is >15,  suggesting elevated LV   filling pressure. - Left atrium: The atrium was normal in size. - Right ventricle: The cavity size was mildly dilated. - Right atrium: Severely dilated. - Tricuspid valve: Could not measure TR jet.  Impressions:  - Compared to a prior study in 2018, the LVEF is lower at 45-50%   with basal to mid inferior wall hypokinesis, grade 2 DD and   elevated LV filling pressure.   LEFT HEART CATH AND CORONARY ANGIOGRAPHY  01/2018  Conclusion   Right dominant coronary anatomy  Ectatic left main without obstruction.  Luminal irregularities are noted  LAD contains ectasia proximally and in the mid to distal vessel there is eccentric segmental 30 to 40% narrowing  The circumflex artery gives origin to 3 marginals with the second marginal being the largest.  Luminal irregularities are noted  The RCA is widely patent with luminal irregularities.  Left ventricular end-diastolic pressure is normal at 15 mmHg.  Ventriculography was not performed because of mild chronic kidney disease.  Total contrast, 40 cc  RECOMMENDATIONS:   No coronary disease and no anatomic variants that would imply that AV block is related to ischemia.  EP evaluation for consideration of DDD pacemaker.  If pacemaker is inserted, resume the patient's beta-blocker therapy as a part of guideline directed medical therapy for systolic dysfunction.  No indication for antiplatelet therapy at this time.    ASSESSMENT & PLAN:   1. Bilateral lower extremity edema L > R - improved with higher dose but return back with lower dose. Compliant with low sodium diet, stocking and leg elevation. Differential includes CHF exacerbation (recent elevated BNP), afib ( noted on device check and EKG) and arterial insufficiency or combination of all.  - Most likely due to persistent atrial fibrillation. The patient was seen and examined by Dr. [name] as well. Idially he needs 40mg  daily but he refuses. After long  discussion for > 20 minutes. He will continues to take 20mg  daily except 40mg  2 times/week (likely Monday and Thursday).  May take additional 1 tablet as needed.   2. Acute on chronic combined CHF - AS above  3. Persistent atrial fibrillation - Last device check 08/23/18 showed persistent AF since March. AF burden 48%. Plan for rate control per Dr. [name]. Continue Coreg and Eliquis. No bleeding issue.     Medication Adjustments/Labs and Tests Ordered: Current medicines are reviewed at length with the patient today.  Concerns regarding medicines are outlined above.  Medication changes, Labs and Tests ordered today are listed in the Patient Instructions below. Patient Instructions  Medication Instructions:  Your physician has recommended you make the following change in your medication:  1.  CHANGE the Lasix 20 to taking 2 tablets on Mondays & Thursdays Take 1 tablet Tuesday, Wednesday, Friday-Sunday You may take 1 extra tablet ONY AS NEEDED daily for swelling  If you need a refill on your cardiac medications before your next appointment, please call your pharmacy.   Lab work: None ordered  If you have labs (blood work) drawn today and your tests are completely normal, you will receive your results only by: Marland Kitchen MyChart Message (if you have MyChart) OR . A paper copy in the mail If you have any lab test that is abnormal or we need to change your treatment, we will call you to review the results.  Testing/Procedures: None ordered  Follow-Up: At [facility], you and your health needs are our priority.  As part of our continuing mission to provide you with exceptional heart care, we have created designated Provider Care Teams.  These Care Teams include your primary Cardiologist (physician) and Advanced Practice Providers (APPs -  Physician Assistants and Nurse Practitioners) who all work together to provide you with the care you need, when you need it. You will need a follow up appointment  in 3-4 months.  Please call our office 2 months in advance to schedule this appointment.  You may see Sinclair Grooms, MD or one of the following Advanced Practice Providers on your designated Care Team:   Truitt Merle, NP Cecilie Kicks, NP . Kathyrn Drown, NP  Any Other Special Instructions Will Be Listed Below (If Applicable).       Jarrett Soho, [place]  10/22/2018 11:49 AM    [facility] [address], [place], [place]  [address] Phone: [phone]; Fax: [phone]

## 2018-10-22 NOTE — Patient Instructions (Signed)
Medication Instructions:  Your physician has recommended you make the following change in your medication:  1.  CHANGE the Lasix 20 to taking 2 tablets on Mondays & Thursdays Take 1 tablet Tuesday, Wednesday, Friday-Sunday You may take 1 extra tablet ONY AS NEEDED daily for swelling  If you need a refill on your cardiac medications before your next appointment, please call your pharmacy.   Lab work: None ordered  If you have labs (blood work) drawn today and your tests are completely normal, you will receive your results only by: Marland Kitchen MyChart Message (if you have MyChart) OR . A paper copy in the mail If you have any lab test that is abnormal or we need to change your treatment, we will call you to review the results.  Testing/Procedures: None ordered  Follow-Up: At [facility], you and your health needs are our priority.  As part of our continuing mission to provide you with exceptional heart care, we have created designated Provider Care Teams.  These Care Teams include your primary Cardiologist (physician) and Advanced Practice Providers (APPs -  Physician Assistants and Nurse Practitioners) who all work together to provide you with the care you need, when you need it. You will need a follow up appointment in 3-4 months.  Please call our office 2 months in advance to schedule this appointment.  You may see Sinclair Grooms, MD or one of the following Advanced Practice Providers on your designated Care Team:   Truitt Merle, NP Cecilie Kicks, NP . Kathyrn Drown, NP  Any Other Special Instructions Will Be Listed Below (If Applicable).

## 2018-10-26 ENCOUNTER — Telehealth: Payer: Self-pay | Admitting: Interventional Cardiology

## 2018-10-26 MED ORDER — FUROSEMIDE 20 MG PO TABS: ORAL_TABLET | ORAL | 11 refills | Status: DC

## 2018-10-26 NOTE — Addendum Note (Signed)
Addended by: Derl Barrow on: 10/26/2018 01:39 PM   Modules accepted: Orders

## 2018-10-26 NOTE — Telephone Encounter (Signed)
New Message     Pt c/o medication issue:  1. Name of Medication: Patient's son not sure just states his water pill  2. How are you currently taking this medication (dosage and times per day)? N/A  3. Are you having a reaction (difficulty breathing--STAT)? no  4. What is your medication issue? Patient's son states patients water pill was increased for the amount to take and needs new script sent over to pharmacy so they will fill it properly .

## 2018-10-26 NOTE — Telephone Encounter (Signed)
Pt's medication was resent because the first Rx was on print. Resent pt's medication to pt's pharmacy as requested. Confirmation received.

## 2018-10-26 NOTE — Telephone Encounter (Signed)
Returned call to # on file, there was not one given with the phone call. Left a detailed message that a new rx had already been sent into [facility], for dose of Lasix. If they had any further questions, to call me back.

## 2018-10-29 ENCOUNTER — Telehealth: Payer: Self-pay | Admitting: Interventional Cardiology

## 2018-10-29 NOTE — Telephone Encounter (Signed)
  Son [name] is calling because it is time to get the patient new compression stockings and they need to know what strength and size to purchase. Can leave a detailed message on voicemail.

## 2018-11-02 NOTE — Telephone Encounter (Signed)
Verified to pt's son the type of compression hose and the mm.

## 2018-11-03 ENCOUNTER — Encounter: Payer: Self-pay | Admitting: *Deleted

## 2018-11-03 NOTE — Telephone Encounter (Signed)
Opened in error

## 2018-11-10 DIAGNOSIS — H353131 Nonexudative age-related macular degeneration, bilateral, early dry stage: Secondary | ICD-10-CM | POA: Diagnosis not present

## 2018-11-10 DIAGNOSIS — Z961 Presence of intraocular lens: Secondary | ICD-10-CM | POA: Diagnosis not present

## 2018-11-23 ENCOUNTER — Ambulatory Visit (INDEPENDENT_AMBULATORY_CARE_PROVIDER_SITE_OTHER): Payer: Medicare Other | Admitting: *Deleted

## 2018-11-23 DIAGNOSIS — I441 Atrioventricular block, second degree: Secondary | ICD-10-CM | POA: Diagnosis not present

## 2018-11-23 DIAGNOSIS — I5032 Chronic diastolic (congestive) heart failure: Secondary | ICD-10-CM

## 2018-11-23 LAB — CUP PACEART REMOTE DEVICE CHECK
Battery Remaining Longevity: 121 mo
Battery Remaining Percentage: 95.5 %
Battery Voltage: 2.99 V
Brady Statistic AP VP Percent: 30 %
Brady Statistic AP VS Percent: 1 %
Brady Statistic AS VP Percent: 67 %
Brady Statistic AS VS Percent: 1 %
Brady Statistic RA Percent Paced: 6.9 %
Brady Statistic RV Percent Paced: 91 %
Date Time Interrogation Session: 20200713060014
Implantable Lead Implant Date: 20190927
Implantable Lead Implant Date: 20190927
Implantable Lead Location: 753859
Implantable Lead Location: 753860
Implantable Pulse Generator Implant Date: 20190927
Lead Channel Impedance Value: 530 Ohm
Lead Channel Impedance Value: 600 Ohm
Lead Channel Pacing Threshold Amplitude: 0.5 V
Lead Channel Pacing Threshold Amplitude: 0.625 V
Lead Channel Pacing Threshold Pulse Width: 0.5 ms
Lead Channel Pacing Threshold Pulse Width: 0.5 ms
Lead Channel Sensing Intrinsic Amplitude: 1.8 mV
Lead Channel Sensing Intrinsic Amplitude: 12 mV
Lead Channel Setting Pacing Amplitude: 0.875
Lead Channel Setting Pacing Amplitude: 2 V
Lead Channel Setting Pacing Pulse Width: 0.5 ms
Lead Channel Setting Sensing Sensitivity: 2 mV
Pulse Gen Model: 2272
Pulse Gen Serial Number: 9066149

## 2018-12-02 DIAGNOSIS — Z85828 Personal history of other malignant neoplasm of skin: Secondary | ICD-10-CM | POA: Diagnosis not present

## 2018-12-02 DIAGNOSIS — L814 Other melanin hyperpigmentation: Secondary | ICD-10-CM | POA: Diagnosis not present

## 2018-12-02 DIAGNOSIS — L570 Actinic keratosis: Secondary | ICD-10-CM | POA: Diagnosis not present

## 2018-12-02 DIAGNOSIS — D229 Melanocytic nevi, unspecified: Secondary | ICD-10-CM | POA: Diagnosis not present

## 2018-12-02 DIAGNOSIS — L821 Other seborrheic keratosis: Secondary | ICD-10-CM | POA: Diagnosis not present

## 2018-12-07 NOTE — Progress Notes (Signed)
Remote pacemaker transmission.   

## 2018-12-15 ENCOUNTER — Other Ambulatory Visit: Payer: Self-pay

## 2018-12-15 ENCOUNTER — Ambulatory Visit (INDEPENDENT_AMBULATORY_CARE_PROVIDER_SITE_OTHER): Payer: Medicare Other | Admitting: Cardiology

## 2018-12-15 ENCOUNTER — Encounter: Payer: Self-pay | Admitting: Cardiology

## 2018-12-15 VITALS — BP 126/70 | HR 71 | Ht 69.5 in | Wt 153.0 lb

## 2018-12-15 DIAGNOSIS — I4819 Other persistent atrial fibrillation: Secondary | ICD-10-CM

## 2018-12-15 MED ORDER — AMIODARONE HCL 200 MG PO TABS: ORAL_TABLET | ORAL | 1 refills | Status: DC

## 2018-12-15 NOTE — Progress Notes (Signed)
Electrophysiology Office Note   Date:  12/15/2018   ID:  Frank Elliott, DOB 12/10/1925, MRN [id number]  PCP:  [name], MD  Cardiologist:  [name] Primary Electrophysiologist:  Kaia Depaolis [name], MD    No chief complaint on file.    History of Present Illness: Frank Elliott is a 85 y.o. male who is being seen today for the evaluation of 2:1 AV block at the request of [name], MD. Presenting today for electrophysiology evaluation.  He has a significant history of chronic systolic heart failure, hypertension, COPD who presented to the hospital September 2019 with syncope and 2-1 AV block.  He is now status post Elvaston dual-chamber pacemaker implanted 02/06/2018.  Today, denies symptoms of palpitations, chest pain, shortness of breath, orthopnea, PND, lower extremity edema, claudication, dizziness, presyncope, syncope, bleeding, or neurologic sequela. The patient is tolerating medications without difficulties.  His symptoms of weakness and fatigue.  Prior to this visit, he was diagnosed with atrial fibrillation and the thought was that we would see how he would do at the next visit prior to discussion of going back into normal rhythm.   Past Medical History:  Diagnosis Date  . Arthritis    "just about all over" (02/05/2018)  . AV block, Mobitz 2 02/03/2018   PPM placed 01/2018  . BPH (benign prostatic hyperplasia)   . Chronic combined systolic and diastolic CHF (congestive heart failure) ([place])   . Complication of anesthesia    "had hard time waking up a long time ago" (02/05/2018)  . Cyst of kidney, acquired    "recently found; ? side" (02/05/2018)  . GERD (gastroesophageal reflux disease)   . History of hiatal hernia   . Hypertension   . Mild CAD    a. 30-40% LAD in 2019.  Marland Kitchen Neuropathy   . NICM (nonischemic cardiomyopathy) ([place])   . Persistent atrial fibrillation   . Pleural effusion    dx 04/01/12  . Skin cancer    "face, nose" (02/05/2018)  . Syncope  02/03/2018   Past Surgical History:  Procedure Laterality Date  . APPENDECTOMY    . BACK SURGERY    . CARDIAC CATHETERIZATION  02/05/2018  . CATARACT EXTRACTION, BILATERAL Bilateral   . CHOLECYSTECTOMY    . JOINT REPLACEMENT    . LEFT HEART CATH AND CORONARY ANGIOGRAPHY N/A 02/05/2018   Procedure: LEFT HEART CATH AND CORONARY ANGIOGRAPHY;  Surgeon: Belva Crome, MD;  Location: [facility];  Service: Cardiovascular;  Laterality: N/A;  . LUMBAR [place] SURGERY  1980s  . PACEMAKER IMPLANT N/A 02/06/2018   Procedure: PACEMAKER IMPLANT;  Surgeon: Constance Haw, MD;  Location: [facility];  Service: Cardiovascular;  Laterality: N/A;  . SKIN CANCER EXCISION     "face, nose" (02/05/2018)  . TONSILLECTOMY    . TOTAL HIP ARTHROPLASTY Right 1990s/2000s  . ULTRASOUND GUIDANCE FOR VASCULAR ACCESS  02/05/2018   Procedure: Ultrasound Guidance For Vascular Access;  Surgeon: Belva Crome, MD;  Location: [place];  Service: Cardiovascular;;     Current Outpatient Medications  Medication Sig Dispense Refill  . apixaban (ELIQUIS) 5 MG TABS tablet Take 1 tablet (5 mg total) by mouth 2 (two) times daily. 60 tablet 6  . betamethasone dipropionate (DIPROLENE) 0.05 % cream Apply 1 application topically 2 (two) times daily as needed. unk  1  . carvedilol (COREG) 6.25 MG tablet TAKE 1 TABLET BY MOUTH TWICE A DAY 180 tablet 1  . Cyanocobalamin (  VITAMIN B-12 PO) Take 1 tablet by mouth daily.    . fluticasone (FLONASE) 50 MCG/ACT nasal spray Place 1 spray into both nostrils daily as needed for allergies.     . furosemide (LASIX) 20 MG tablet Take as directed 1 tablet tues, wed, fri-Sunday 2 tablets on mon & thursdays May have 1 extra tablet daily as needed for swelling 45 tablet 11  . gabapentin (NEURONTIN) 300 MG capsule Take 300 mg by mouth 3 (three) times daily.    Marland Kitchen losartan (COZAAR) 100 MG tablet Take 0.5 tablets (50 mg total) by mouth daily. 90 tablet 3  . omeprazole (PRILOSEC)  20 MG capsule Take 20 mg by mouth 2 (two) times daily.    . Probiotic Product (ALIGN PO) Take 1 capsule by mouth daily.    Marland Kitchen amiodarone (PACERONE) 200 MG tablet Take 1 tablet (200 mg total) twice a day for one month and then reduce to one tablet daily 90 tablet 1   No current facility-administered medications for this visit.     Allergies:   Aspirin, Whiskey [alcohol], Amoxicillin, Avelox [moxifloxacin hcl in nacl], Lisinopril, Penicillins, Sulfa antibiotics, Tape, and Zithromax [azithromycin]   Social History:  The patient  reports that he has quit smoking. He has never used smokeless tobacco. He reports previous alcohol use. He reports that he does not use drugs.   Family History:  The patient's family history includes Heart disease in his father.    ROS:  Please see the history of present illness.   Otherwise, review of systems is positive for none.   All other systems are reviewed and negative.   PHYSICAL EXAM: VS:  BP 126/70   Pulse 71   Ht 5' 9.5" (1.765 m)   Wt 153 lb (69.4 kg)   BMI 22.27 kg/m  , BMI Body mass index is 22.27 kg/m. GEN: Well nourished, well developed, in no acute distress  HEENT: normal  Neck: no JVD, carotid bruits, or masses Cardiac: iRRR; no murmurs, rubs, or gallops,no edema  Respiratory:  clear to auscultation bilaterally, normal work of breathing GI: soft, nontender, nondistended, + BS MS: no deformity or atrophy  Skin: warm and dry, device site well healed Neuro:  Strength and sensation are intact Psych: euthymic mood, full affect  EKG:  EKG is ordered today. Personal review of the ekg ordered shows ventricular paced, PVC, atrial fibrillation  Personal review of the device interrogation today. Results in [place]: 10/08/2018: ALT 16; Hemoglobin 14.2; Magnesium 2.4; NT-Pro BNP 3,397; Platelets 218; TSH 1.410 10/12/2018: BUN 33; Creatinine, Ser 1.46; Potassium 5.1; Sodium 142    Lipid Panel  No results found for: CHOL, TRIG, HDL,  CHOLHDL, VLDL, LDLCALC, LDLDIRECT   Wt Readings from Last 3 Encounters:  12/15/18 153 lb (69.4 kg)  10/22/18 156 lb 6.4 oz (70.9 kg)  10/15/18 157 lb 6.4 oz (71.4 kg)      Other studies Reviewed: Additional studies/ records that were reviewed today include: TTE 02/04/18  Review of the above records today demonstrates:  - Left ventricle: The cavity size was normal. Wall thickness was   normal. Systolic function was mildly reduced. The estimated   ejection fraction was in the range of 45% to 50%. Basal to mid   inferior hypokinesis. Doppler parameters are consistent with   pseudonormal left ventricular relaxation (grade 2 diastolic   dysfunction). The E/e&' ratio is >15, suggesting elevated LV   filling pressure. - Left atrium: The atrium was normal in  size. - Right ventricle: The cavity size was mildly dilated. - Right atrium: Severely dilated. - Tricuspid valve: Could not measure TR jet.  LHC 02/06/18  Right dominant coronary anatomy  Ectatic left main without obstruction.  Luminal irregularities are noted  LAD contains ectasia proximally and in the mid to distal vessel there is eccentric segmental 30 to 40% narrowing  The circumflex artery gives origin to 3 marginals with the second marginal being the largest.  Luminal irregularities are noted  The RCA is widely patent with luminal irregularities.  Left ventricular end-diastolic pressure is normal at 15 mmHg.  Ventriculography was not performed because of mild chronic kidney disease.  Total contrast, 40 cc   ASSESSMENT AND PLAN:  1.  2-1 AV block: Status post Saint Jude dual-chamber pacemaker.  Device functioning appropriately.  No changes.   2.  Hypertension: Currently well controlled.  No changes.  3.  Chronic diastolic heart failure: No signs of volume overload  4.  Paroxysmal atrial fibrillation: Currently on Eliquis.  Unfortunately he is having symptoms now of weakness and fatigue.  Due to that, we Yuri Flener start him  on amiodarone.  We Naasir Carreira have him send in a remote transmission in 3 to 4 weeks after amiodarone load to see if he has gone back into normal rhythm.  If he has not, would consider cardioversion.  This patients CHA2DS2-VASc Score and unadjusted Ischemic Stroke Rate (% per year) is equal to 3.2 % stroke rate/year from a score of 3  Above score calculated as 1 point each if present [CHF, HTN, DM, Vascular=MI/PAD/Aortic Plaque, Age if 65-74, or Male] Above score calculated as 2 points each if present [Age > 75, or Stroke/TIA/TE]   Current medicines are reviewed at length with the patient today.   The patient does not have concerns regarding his medicines.  The following changes were made today: Start amiodarone  Labs/ tests ordered today include:  Orders Placed This Encounter  Procedures  . EKG 12-Lead     Disposition:   FU with Rafe Mackowski 3 months  Signed, Talayia Hjort [name], MD  12/15/2018 4:37 PM     [place] [id number] [phone] (office) [phone] (fax)

## 2018-12-15 NOTE — Patient Instructions (Addendum)
Medication Instructions:  Your physician has recommended you make the following change in your medication:  1. START Amiodarone  - take 200 mg TWICE daily for one month, then  - take 200 mg ONCE daily   * If you need a refill on your cardiac medications before your next appointment, please call your pharmacy.   Labwork: None ordered  Testing/Procedures: None ordered  Follow-Up: You are scheduled for a remote check from home on 01/05/19.  Your physician recommends that you schedule a follow-up appointment in: 3 months with Dr. Curt Bears.  Thank you for choosing [facility]!!   Trinidad Curet, RN [phone]  Any Other Special Instructions Will Be Listed Below (If Applicable).   Amiodarone tablets What is this medicine? AMIODARONE (a MEE oh da rone) is an antiarrhythmic drug. It helps make your heart beat regularly. Because of the side effects caused by this medicine, it is only used when other medicines have not worked. It is usually used for heartbeat problems that may be life threatening. This medicine may be used for other purposes; ask your health care provider or pharmacist if you have questions. COMMON BRAND NAME(S): Cordarone, Pacerone What should I tell my health care provider before I take this medicine? They need to know if you have any of these conditions:  liver disease  lung disease  other heart problems  thyroid disease  an unusual or allergic reaction to amiodarone, iodine, other medicines, foods, dyes, or preservatives  pregnant or trying to get pregnant  breast-feeding How should I use this medicine? Take this medicine by mouth with a glass of water. Follow the directions on the prescription label. You can take this medicine with or without food. However, you should always take it the same way each time. Take your doses at regular intervals. Do not take your medicine more often than directed. Do not stop taking except on the advice of your doctor or  health care professional. A special MedGuide will be given to you by the pharmacist with each prescription and refill. Be sure to read this information carefully each time. Talk to your pediatrician regarding the use of this medicine in children. Special care may be needed. Overdosage: If you think you have taken too much of this medicine contact a poison control center or emergency room at once. NOTE: This medicine is only for you. Do not share this medicine with others. What if I miss a dose? If you miss a dose, take it as soon as you can. If it is almost time for your next dose, take only that dose. Do not take double or extra doses. What may interact with this medicine? Do not take this medicine with any of the following medications:  abarelix  apomorphine  arsenic trioxide  certain antibiotics like erythromycin, gemifloxacin, levofloxacin, pentamidine  certain medicines for depression like amoxapine, tricyclic antidepressants  certain medicines for fungal infections like fluconazole, itraconazole, ketoconazole, posaconazole, voriconazole  certain medicines for irregular heart beat like disopyramide, dronedarone, ibutilide, propafenone, sotalol  certain medicines for malaria like chloroquine, halofantrine  cisapride  droperidol  haloperidol  hawthorn  maprotiline  methadone  phenothiazines like chlorpromazine, mesoridazine, thioridazine  pimozide  ranolazine  red yeast rice  vardenafil This medicine may also interact with the following medications:  antiviral medicines for HIV or AIDS  certain medicines for blood pressure, heart disease, irregular heart beat  certain medicines for cholesterol like atorvastatin, cerivastatin, lovastatin, simvastatin  certain medicines for hepatitis C like sofosbuvir and ledipasvir;  sofosbuvir  certain medicines for seizures like phenytoin  certain medicines for thyroid problems  certain medicines that treat or prevent  blood clots like warfarin  cholestyramine  cimetidine  clopidogrel  cyclosporine  dextromethorphan  diuretics  dofetilide  fentanyl  general anesthetics  grapefruit juice  lidocaine  loratadine  methotrexate  other medicines that prolong the QT interval (cause an abnormal heart rhythm)  procainamide  quinidine  rifabutin, rifampin, or rifapentine  St. John's Wort  trazodone  ziprasidone This list may not describe all possible interactions. Give your health care provider a list of all the medicines, herbs, non-prescription drugs, or dietary supplements you use. Also tell them if you smoke, drink alcohol, or use illegal drugs. Some items may interact with your medicine. What should I watch for while using this medicine? Your condition will be monitored closely when you first begin therapy. Often, this drug is first started in a hospital or other monitored health care setting. Once you are on maintenance therapy, visit your doctor or health care professional for regular checks on your progress. Because your condition and use of this medicine carry some risk, it is a good idea to carry an identification card, necklace or bracelet with details of your condition, medications, and doctor or health care professional. Dennis Bast may get drowsy or dizzy. Do not drive, use machinery, or do anything that needs mental alertness until you know how this medicine affects you. Do not stand or sit up quickly, especially if you are an older patient. This reduces the risk of dizzy or fainting spells. This medicine can make you more sensitive to the sun. Keep out of the sun. If you cannot avoid being in the sun, wear protective clothing and use sunscreen. Do not use sun lamps or tanning beds/booths. You should have regular eye exams before and during treatment. Call your doctor if you have blurred vision, see halos, or your eyes become sensitive to light. Your eyes may get dry. It may be helpful to  use a lubricating eye solution or artificial tears solution. If you are going to have surgery or a procedure that requires contrast dyes, tell your doctor or health care professional that you are taking this medicine. What side effects may I notice from receiving this medicine? Side effects that you should report to your doctor or health care professional as soon as possible:  allergic reactions like skin rash, itching or hives, swelling of the face, lips, or tongue  blue-gray coloring of the skin  blurred vision, seeing blue green halos, increased sensitivity of the eyes to light  breathing problems  chest pain  dark urine  fast, irregular heartbeat  feeling faint or light-headed  intolerance to heat or cold  nausea or vomiting  pain and swelling of the scrotum  pain, tingling, numbness in feet, hands  redness, blistering, peeling or loosening of the skin, including inside the mouth  spitting up blood  stomach pain  sweating  unusual or uncontrolled movements of body  unusually weak or tired  weight gain or loss  yellowing of the eyes or skin Side effects that usually do not require medical attention (report to your doctor or health care professional if they continue or are bothersome):  change in sex drive or performance  constipation  dizziness  headache  loss of appetite  trouble sleeping This list may not describe all possible side effects. Call your doctor for medical advice about side effects. You may report side effects to FDA at  1-800-FDA-1088. Where should I keep my medicine? Keep out of the reach of children. Store at room temperature between 20 and 25 degrees C (68 and 77 degrees F). Protect from light. Keep container tightly closed. Throw away any unused medicine after the expiration date. NOTE: This sheet is a summary. It may not cover all possible information. If you have questions about this medicine, talk to your doctor, pharmacist, or  health care provider.  2020 Elsevier/Gold Standard (2018-04-01 13:44:04)

## 2018-12-21 ENCOUNTER — Other Ambulatory Visit: Payer: Self-pay | Admitting: Cardiology

## 2018-12-21 NOTE — Telephone Encounter (Signed)
34m 69.4kg Scr 1.46  Lovw/ camnitz 12/15/18

## 2018-12-31 DIAGNOSIS — M79605 Pain in left leg: Secondary | ICD-10-CM | POA: Diagnosis not present

## 2019-01-04 DIAGNOSIS — S40812A Abrasion of left upper arm, initial encounter: Secondary | ICD-10-CM | POA: Diagnosis not present

## 2019-01-05 ENCOUNTER — Ambulatory Visit (INDEPENDENT_AMBULATORY_CARE_PROVIDER_SITE_OTHER): Payer: Medicare Other | Admitting: *Deleted

## 2019-01-05 DIAGNOSIS — R55 Syncope and collapse: Secondary | ICD-10-CM

## 2019-01-05 DIAGNOSIS — I441 Atrioventricular block, second degree: Secondary | ICD-10-CM

## 2019-01-06 ENCOUNTER — Telehealth: Payer: Self-pay

## 2019-01-06 NOTE — Telephone Encounter (Signed)
Pt son was confused on sending the transmission in August. I let the pt son know per Dr. Curt Bears notes that he wanted to see if the pt was back in normal rhythm because of the medication change. The pt son agreed to send a manual transmission for the pt.

## 2019-01-07 LAB — CUP PACEART REMOTE DEVICE CHECK
Battery Remaining Longevity: 130 mo
Battery Remaining Percentage: 95.5 %
Battery Voltage: 2.99 V
Brady Statistic AP VP Percent: 0 %
Brady Statistic AP VS Percent: 0 %
Brady Statistic AS VP Percent: 0 %
Brady Statistic AS VS Percent: 0 %
Brady Statistic RA Percent Paced: 0 %
Brady Statistic RV Percent Paced: 86 %
Date Time Interrogation Session: 20200826192937
Implantable Lead Implant Date: 20190927
Implantable Lead Implant Date: 20190927
Implantable Lead Location: 753859
Implantable Lead Location: 753860
Implantable Pulse Generator Implant Date: 20190927
Lead Channel Impedance Value: 490 Ohm
Lead Channel Impedance Value: 560 Ohm
Lead Channel Pacing Threshold Amplitude: 0.5 V
Lead Channel Pacing Threshold Amplitude: 0.625 V
Lead Channel Pacing Threshold Pulse Width: 0.5 ms
Lead Channel Pacing Threshold Pulse Width: 0.5 ms
Lead Channel Sensing Intrinsic Amplitude: 1.5 mV
Lead Channel Sensing Intrinsic Amplitude: 12 mV
Lead Channel Setting Pacing Amplitude: 0.875
Lead Channel Setting Pacing Amplitude: 2 V
Lead Channel Setting Pacing Pulse Width: 0.5 ms
Lead Channel Setting Sensing Sensitivity: 2 mV
Pulse Gen Model: 2272
Pulse Gen Serial Number: 9066149

## 2019-01-11 ENCOUNTER — Telehealth: Payer: Self-pay | Admitting: Physician Assistant

## 2019-01-11 DIAGNOSIS — Z23 Encounter for immunization: Secondary | ICD-10-CM | POA: Diagnosis not present

## 2019-01-11 DIAGNOSIS — M79605 Pain in left leg: Secondary | ICD-10-CM | POA: Diagnosis not present

## 2019-01-11 DIAGNOSIS — R21 Rash and other nonspecific skin eruption: Secondary | ICD-10-CM | POA: Diagnosis not present

## 2019-01-11 NOTE — Telephone Encounter (Signed)
Pt son called stating that the pt has swelling in both legs and one hand despite taking 40 mg lasix. The patient has been urinating with the lasix dose. He was seen by his PCP for pain in his left thigh. The patient fell twice on 12/30/18 - no broken bones. More x-rays completed today for continued pain.   No SOB or orthopnea, although he has been sleeping in a chair for pain.   He is compliant on eliquis, so I do not suspect DVT.   He alternates taking 20 mg once daily and 20 mg twice daily. He took 20 mg twice today. I advised to take 20 mg twice tomorrow as well and we will try to get him a clinic appt tomorrow. We discussed when to come to the ER.   I will forward to EP and [facility] scheduling for an ASAP visit tomorrow.   Son expressed understanding of the plan.   Ledora Bottcher, PA-C 01/11/2019, 7:29 PM [place] [address] [place], [place] [address]

## 2019-01-12 ENCOUNTER — Telehealth: Payer: Self-pay

## 2019-01-12 NOTE — Progress Notes (Signed)
Cardiology Office Note:    Date:  01/13/2019   ID:  Frank Elliott, DOB 09/06/1925, MRN [id number]  PCP:  [name], MD  Cardiologist:  Sinclair Grooms, MD   Referring MD: [name], MD   Chief Complaint  Patient presents with   Congestive Heart Failure   Advice Only    Recent fall    History of Present Illness:    Frank Elliott is a 85 y.o. male with a hx of systolic heart failure converted to chronic combined systolic and diastolic heart failure EF 45%, second degree AVB --> permanent pacemaker 01/2018, hypertension, pleural effusion, CKD, and recent colitis. Recent symptomatic AF, AMIO started by EP, and worsening CHF since.  Frank Elliott is accompanied by his son today.  2 weeks ago the patient fell twice injuring his left hip area.  Since that time he has noted severe pain in the posterior left thigh.  The discomfort is so severe that he is unable to lie down at night or to elevate his legs as he ordinarily does.  He has not been able to wear compression stockings as he ordinarily does.  Over this timeframe while mostly sleeping in a recliner, they have noted progressive lower extremity swelling.  They are not so concerned about the swelling as they are about his progressive inability to ambulate.  They feel that the left leg is weak and the patient is unable to use it either because of pain or loss of strength.  He previously lived independently.  He is now unable to stand.  He is in the office today on a wheelchair.  While sitting he is in discomfort although not excruciating.  He does note that the left hand is been swelling.  I note an Ace bandage around the forearm which has been placed to cover a skin tear that occurred around the time of the fall.  It is been changed twice.  It is tight enough to occlude venous return.  Past Medical History:  Diagnosis Date   Arthritis    "just about all over" (02/05/2018)   AV block, Mobitz 2 02/03/2018   PPM placed 01/2018     BPH (benign prostatic hyperplasia)    Chronic combined systolic and diastolic CHF (congestive heart failure) ([place])    Complication of anesthesia    "had hard time waking up a long time ago" (02/05/2018)   Cyst of kidney, acquired    "recently found; ? side" (02/05/2018)   GERD (gastroesophageal reflux disease)    History of hiatal hernia    Hypertension    Mild CAD    a. 30-40% LAD in 2019.   Neuropathy    NICM (nonischemic cardiomyopathy) (HCC)    Persistent atrial fibrillation    Pleural effusion    dx 04/01/12   Skin cancer    "face, nose" (02/05/2018)   Syncope 02/03/2018    Past Surgical History:  Procedure Laterality Date   APPENDECTOMY     BACK SURGERY     CARDIAC CATHETERIZATION  02/05/2018   CATARACT EXTRACTION, BILATERAL Bilateral    CHOLECYSTECTOMY     JOINT REPLACEMENT     LEFT HEART CATH AND CORONARY ANGIOGRAPHY N/A 02/05/2018   Procedure: LEFT HEART CATH AND CORONARY ANGIOGRAPHY;  Surgeon: Belva Crome, MD;  Location: [facility];  Service: Cardiovascular;  Laterality: N/A;   LUMBAR DISC SURGERY  1980s   PACEMAKER IMPLANT N/A 02/06/2018   Procedure: PACEMAKER IMPLANT;  Surgeon: Curt Bears, Will  Hassell Done, MD;  Location: [facility];  Service: Cardiovascular;  Laterality: N/A;   SKIN CANCER EXCISION     "face, nose" (02/05/2018)   TONSILLECTOMY     TOTAL HIP ARTHROPLASTY Right 1990s/2000s   ULTRASOUND GUIDANCE FOR VASCULAR ACCESS  02/05/2018   Procedure: Ultrasound Guidance For Vascular Access;  Surgeon: Belva Crome, MD;  Location: [facility];  Service: Cardiovascular;;    Current Medications: Current Meds  Medication Sig   amiodarone (PACERONE) 200 MG tablet Take 1 tablet (200 mg total) twice a day for one month and then reduce to one tablet daily   betamethasone dipropionate (DIPROLENE) 0.05 % cream Apply 1 application topically 2 (two) times daily as needed. unk   carvedilol (COREG) 6.25 MG tablet TAKE 1  TABLET BY MOUTH TWICE A DAY   Cyanocobalamin (VITAMIN B-12 PO) Take 1 tablet by mouth daily.   ELIQUIS 5 MG TABS tablet TAKE 1 TABLET BY MOUTH TWICE A DAY   fluticasone (FLONASE) 50 MCG/ACT nasal spray Place 1 spray into both nostrils daily as needed for allergies.    furosemide (LASIX) 20 MG tablet Take as directed 1 tablet tues, wed, fri-Sunday 2 tablets on mon & thursdays May have 1 extra tablet daily as needed for swelling   gabapentin (NEURONTIN) 300 MG capsule Take 300 mg by mouth 3 (three) times daily.   losartan (COZAAR) 100 MG tablet Take 0.5 tablets (50 mg total) by mouth daily.   omeprazole (PRILOSEC) 20 MG capsule Take 20 mg by mouth 2 (two) times daily.   Probiotic Product (ALIGN PO) Take 1 capsule by mouth daily.     Allergies:   Aspirin, Whiskey [alcohol], Amoxicillin, Avelox [moxifloxacin hcl in nacl], Lisinopril, Penicillins, Sulfa antibiotics, Tape, and Zithromax [azithromycin]   Social History   Socioeconomic History   Marital status: Widowed    Spouse name: Not on file   Number of children: Not on file   Years of education: Not on file   Highest education level: Not on file  Occupational History   Occupation: Retired  Scientist, product/process development strain: Not on file   Food insecurity    Worry: Not on file    Inability: Not on Lexicographer needs    Medical: Not on file    Non-medical: Not on file  Tobacco Use   Smoking status: Former Smoker   Smokeless tobacco: Never Used   Tobacco comment: "smoked & chewed  in teens & early 20's"  Substance and Sexual Activity   Alcohol use: Not Currently    Comment: "drank in teens & early 20's"   Drug use: Never   Sexual activity: Not on file  Lifestyle   Physical activity    Days per week: Not on file    Minutes per session: Not on file   Stress: Not on file  Relationships   Social connections    Talks on phone: Not on file    Gets together: Not on file    Attends religious  service: Not on file    Active member of club or organization: Not on file    Attends meetings of clubs or organizations: Not on file    Relationship status: Not on file  Other Topics Concern   Not on file  Social History Narrative   He lives alone, son frequently goes with him to [place] appointments.  Other family members help in his care.     Family History: The patient's  family history includes Heart disease in his father.  ROS:   Please see the history of present illness.    The patient is in pain and wants to have something done.  He was recommended Tylenol by Dr. Doyle Askew.  States that Tylenol is not helping.  He now is at his son's house because he is unable to care for himself.  They are also concerned about upcoming family events including the marriage of 1 of his grandchildren and a family gathering will be occurring starting 2 weeks from now.  They wonder if he will be able to participate in these milestones events.  All other systems reviewed and are negative.  EKGs/Labs/Other Studies Reviewed:    The following studies were reviewed today: No new cardiac imaging data  EKG:  EKG EKG is not performed today.  Heart rate is relatively slow based on peripheral pulse recording.  I do note that he is being loaded with amiodarone by Dr. Curt Bears.  Recent Labs: 10/08/2018: ALT 16; Hemoglobin 14.2; Magnesium 2.4; NT-Pro BNP 3,397; Platelets 218; TSH 1.410 10/12/2018: BUN 33; Creatinine, Ser 1.46; Potassium 5.1; Sodium 142  Recent Lipid Panel No results found for: CHOL, TRIG, HDL, CHOLHDL, VLDL, LDLCALC, LDLDIRECT  Physical Exam:    VS:  BP 118/64    Pulse (!) 44    Ht 5' 9.5" (1.765 m)    Wt 162 lb 1.9 oz (73.5 kg)    SpO2 95%    BMI 23.60 kg/m     Wt Readings from Last 3 Encounters:  01/13/19 162 lb 1.9 oz (73.5 kg)  12/15/18 153 lb (69.4 kg)  10/22/18 156 lb 6.4 oz (70.9 kg)     GEN: Elderly, frail, sitting in a wheelchair.. No acute distress HEENT: Normal NECK: No  JVD. LYMPHATICS: No lymphadenopathy CARDIAC: Irregularly irregular slow rate around 50.  Without murmur, gallop, but there is significant bilateral left greater than right ankle to knee edema. VASCULAR:  Normal Pulses. No bruits. RESPIRATORY:  Clear to auscultation without rales, wheezing or rhonchi  ABDOMEN: Soft, non-tender, non-distended, No pulsatile mass, MUSCULOSKELETAL: No deformity  SKIN: Warm and dry NEUROLOGIC:  Alert and oriented x 3.  He is unable to lift his left leg.  It is difficult for me to tell if this is due to pain or paralysis. PSYCHIATRIC:  Normal affect   ASSESSMENT:    1. Persistent atrial fibrillation   2. Acute on chronic combined systolic and diastolic CHF (congestive heart failure) (HCC)   3. Weakness of left leg   4. Edema of both lower legs due to peripheral venous insufficiency   5. Essential hypertension   6. Pacemaker   7. AV block, Mobitz 2   8. Educated About Covid-19 Virus Infection    PLAN:    In order of problems listed above:  1. Being treated with amiodarone by Dr. Curt Bears.  He is still in atrial fibrillation based upon today's exam.  Consideration is being given to electrical cardioversion if he does not have spontaneous conversion on amiodarone.  Heart rate is relatively slow but the patient has no symptoms relative to bradycardia. 2. There is volume overload due to dependent edema.  Neck veins and pulmonary exam do not suggest heart failure.  No change in his current diuretic regimen. 3. The right leg is painful and weak following 2 falls 2 weeks ago.  Apparently x-rays have been done and there is no evidence of a fracture.  This evaluation has been done by Dr. Olen Pel.  I will speak with his primary physician.  It seems that the patient needs to have either an orthopedic or neurosurgical evaluation.  I think much of his pain is neurogenic.  We need to exclude paralysis in the left leg.  Patient is not currently able to ambulate in each day he  spends in the wheelchair is causing deconditioning and will likely take away his independence.  This needs to be evaluated the symptoms possible. 4. Bilateral lower extremity edema secondary to dependency.  There could also be a component of low oncotic pressure. 5. No blood pressure issues currently 6. Not addressed 7. Not addressed 8. Social distancing, mask wearing, and handwashing is stressed.  The major concerns today have to do with the recent fall, immobility, and pain.  There may be also weakness in his left leg.  This needs further direction and evaluation.  Will speak with Dr. Doyle Askew and allow him to direct further care and evaluation referral.   Medication Adjustments/Labs and Tests Ordered: Current medicines are reviewed at length with the patient today.  Concerns regarding medicines are outlined above.  No orders of the defined types were placed in this encounter.  No orders of the defined types were placed in this encounter.   Patient Instructions  Medication Instructions:  Your physician recommends that you continue on your current medications as directed. Please refer to the Current Medication list given to you today.  If you need a refill on your cardiac medications before your next appointment, please call your pharmacy.   Lab work: None If you have labs (blood work) drawn today and your tests are completely normal, you will receive your results only by:  [place] (if you have MyChart) OR  A paper copy in the mail If you have any lab test that is abnormal or we need to change your treatment, we will call you to review the results.  Testing/Procedures: None  Follow-Up: Your physician recommends that you schedule a follow-up appointment as needed with Dr. [name].    Any Other Special Instructions Will Be Listed Below (If Applicable)      Signed, Sinclair Grooms, MD  01/13/2019 12:58 PM    [place]

## 2019-01-12 NOTE — Addendum Note (Signed)
Addended by: Douglass Rivers D on: 01/12/2019 02:52 PM   Modules accepted: Level of Service

## 2019-01-12 NOTE — Telephone Encounter (Signed)
Dr. Curt Bears reviewed this morning.  Feels ok for pt to wait until already scheduled appt w/ gen card, Dr. [name], tomorrow.

## 2019-01-12 NOTE — Progress Notes (Signed)
Remote pacemaker transmission.   

## 2019-01-12 NOTE — Telephone Encounter (Signed)
I spoke to the patient's son [name]) and informed him that Angie Duke's notes were reviewed with Dr Curt Bears and he suggested that the patient keeps the f/u appointment with Dr [name] 9/2 to further discuss.  He verbalized understanding.

## 2019-01-13 ENCOUNTER — Other Ambulatory Visit: Payer: Self-pay

## 2019-01-13 ENCOUNTER — Ambulatory Visit (INDEPENDENT_AMBULATORY_CARE_PROVIDER_SITE_OTHER): Payer: Medicare Other | Admitting: Interventional Cardiology

## 2019-01-13 ENCOUNTER — Encounter: Payer: Self-pay | Admitting: Interventional Cardiology

## 2019-01-13 VITALS — BP 118/64 | HR 44 | Ht 69.5 in | Wt 162.1 lb

## 2019-01-13 DIAGNOSIS — I4819 Other persistent atrial fibrillation: Secondary | ICD-10-CM | POA: Diagnosis not present

## 2019-01-13 DIAGNOSIS — Z7189 Other specified counseling: Secondary | ICD-10-CM | POA: Diagnosis not present

## 2019-01-13 DIAGNOSIS — I5043 Acute on chronic combined systolic (congestive) and diastolic (congestive) heart failure: Secondary | ICD-10-CM | POA: Diagnosis not present

## 2019-01-13 DIAGNOSIS — I441 Atrioventricular block, second degree: Secondary | ICD-10-CM

## 2019-01-13 DIAGNOSIS — Z950 Presence of cardiac pacemaker: Secondary | ICD-10-CM | POA: Diagnosis not present

## 2019-01-13 DIAGNOSIS — I10 Essential (primary) hypertension: Secondary | ICD-10-CM | POA: Diagnosis not present

## 2019-01-13 DIAGNOSIS — R609 Edema, unspecified: Secondary | ICD-10-CM | POA: Diagnosis not present

## 2019-01-13 DIAGNOSIS — I872 Venous insufficiency (chronic) (peripheral): Secondary | ICD-10-CM | POA: Diagnosis not present

## 2019-01-13 DIAGNOSIS — R600 Localized edema: Secondary | ICD-10-CM

## 2019-01-13 DIAGNOSIS — R29898 Other symptoms and signs involving the musculoskeletal system: Secondary | ICD-10-CM

## 2019-01-13 NOTE — Patient Instructions (Signed)
Medication Instructions:  Your physician recommends that you continue on your current medications as directed. Please refer to the Current Medication list given to you today.  If you need a refill on your cardiac medications before your next appointment, please call your pharmacy.   Lab work: None If you have labs (blood work) drawn today and your tests are completely normal, you will receive your results only by: . MyChart Message (if you have MyChart) OR . A paper copy in the mail If you have any lab test that is abnormal or we need to change your treatment, we will call you to review the results.  Testing/Procedures: None  Follow-Up: Your physician recommends that you schedule a follow-up appointment as needed with Dr. Smith.   Any Other Special Instructions Will Be Listed Below (If Applicable).    

## 2019-01-14 DIAGNOSIS — M5416 Radiculopathy, lumbar region: Secondary | ICD-10-CM | POA: Diagnosis not present

## 2019-01-14 DIAGNOSIS — M4856XS Collapsed vertebra, not elsewhere classified, lumbar region, sequela of fracture: Secondary | ICD-10-CM | POA: Diagnosis not present

## 2019-01-14 DIAGNOSIS — M545 Low back pain: Secondary | ICD-10-CM | POA: Diagnosis not present

## 2019-01-15 ENCOUNTER — Other Ambulatory Visit: Payer: Self-pay | Admitting: Orthopedic Surgery

## 2019-01-15 DIAGNOSIS — M4856XS Collapsed vertebra, not elsewhere classified, lumbar region, sequela of fracture: Secondary | ICD-10-CM

## 2019-01-15 DIAGNOSIS — M47816 Spondylosis without myelopathy or radiculopathy, lumbar region: Secondary | ICD-10-CM | POA: Diagnosis not present

## 2019-01-15 DIAGNOSIS — Z981 Arthrodesis status: Secondary | ICD-10-CM | POA: Diagnosis not present

## 2019-01-15 DIAGNOSIS — S32040A Wedge compression fracture of fourth lumbar vertebra, initial encounter for closed fracture: Secondary | ICD-10-CM | POA: Diagnosis not present

## 2019-01-15 DIAGNOSIS — S22080A Wedge compression fracture of T11-T12 vertebra, initial encounter for closed fracture: Secondary | ICD-10-CM | POA: Diagnosis not present

## 2019-01-17 ENCOUNTER — Other Ambulatory Visit: Payer: Self-pay

## 2019-01-17 ENCOUNTER — Emergency Department (HOSPITAL_COMMUNITY): Payer: Medicare Other

## 2019-01-17 ENCOUNTER — Encounter (HOSPITAL_COMMUNITY): Payer: Self-pay

## 2019-01-17 ENCOUNTER — Inpatient Hospital Stay (HOSPITAL_COMMUNITY)
Admission: EM | Admit: 2019-01-17 | Discharge: 2019-01-28 | DRG: 469 | Disposition: A | Discharge status: Rehabilitation Facility | Payer: Medicare Other | Attending: Internal Medicine | Admitting: Internal Medicine

## 2019-01-17 DIAGNOSIS — K219 Gastro-esophageal reflux disease without esophagitis: Secondary | ICD-10-CM | POA: Diagnosis present

## 2019-01-17 DIAGNOSIS — E559 Vitamin D deficiency, unspecified: Secondary | ICD-10-CM | POA: Diagnosis present

## 2019-01-17 DIAGNOSIS — N179 Acute kidney failure, unspecified: Secondary | ICD-10-CM | POA: Diagnosis not present

## 2019-01-17 DIAGNOSIS — Z881 Allergy status to other antibiotic agents status: Secondary | ICD-10-CM

## 2019-01-17 DIAGNOSIS — Z111 Encounter for screening for respiratory tuberculosis: Secondary | ICD-10-CM | POA: Diagnosis not present

## 2019-01-17 DIAGNOSIS — D539 Nutritional anemia, unspecified: Secondary | ICD-10-CM | POA: Diagnosis present

## 2019-01-17 DIAGNOSIS — I2510 Atherosclerotic heart disease of native coronary artery without angina pectoris: Secondary | ICD-10-CM | POA: Diagnosis present

## 2019-01-17 DIAGNOSIS — W19XXXA Unspecified fall, initial encounter: Secondary | ICD-10-CM

## 2019-01-17 DIAGNOSIS — N390 Urinary tract infection, site not specified: Secondary | ICD-10-CM | POA: Diagnosis not present

## 2019-01-17 DIAGNOSIS — Z8249 Family history of ischemic heart disease and other diseases of the circulatory system: Secondary | ICD-10-CM

## 2019-01-17 DIAGNOSIS — G629 Polyneuropathy, unspecified: Secondary | ICD-10-CM | POA: Diagnosis present

## 2019-01-17 DIAGNOSIS — R509 Fever, unspecified: Secondary | ICD-10-CM | POA: Diagnosis not present

## 2019-01-17 DIAGNOSIS — R52 Pain, unspecified: Secondary | ICD-10-CM | POA: Diagnosis not present

## 2019-01-17 DIAGNOSIS — I428 Other cardiomyopathies: Secondary | ICD-10-CM | POA: Diagnosis present

## 2019-01-17 DIAGNOSIS — I5043 Acute on chronic combined systolic (congestive) and diastolic (congestive) heart failure: Secondary | ICD-10-CM

## 2019-01-17 DIAGNOSIS — M898X9 Other specified disorders of bone, unspecified site: Secondary | ICD-10-CM | POA: Diagnosis present

## 2019-01-17 DIAGNOSIS — I5041 Acute combined systolic (congestive) and diastolic (congestive) heart failure: Secondary | ICD-10-CM

## 2019-01-17 DIAGNOSIS — G9341 Metabolic encephalopathy: Secondary | ICD-10-CM | POA: Diagnosis not present

## 2019-01-17 DIAGNOSIS — K5900 Constipation, unspecified: Secondary | ICD-10-CM | POA: Diagnosis not present

## 2019-01-17 DIAGNOSIS — N189 Chronic kidney disease, unspecified: Secondary | ICD-10-CM | POA: Diagnosis present

## 2019-01-17 DIAGNOSIS — I10 Essential (primary) hypertension: Secondary | ICD-10-CM | POA: Diagnosis present

## 2019-01-17 DIAGNOSIS — R7881 Bacteremia: Secondary | ICD-10-CM | POA: Diagnosis not present

## 2019-01-17 DIAGNOSIS — Z03818 Encounter for observation for suspected exposure to other biological agents ruled out: Secondary | ICD-10-CM | POA: Diagnosis not present

## 2019-01-17 DIAGNOSIS — S72002A Fracture of unspecified part of neck of left femur, initial encounter for closed fracture: Secondary | ICD-10-CM | POA: Diagnosis not present

## 2019-01-17 DIAGNOSIS — I5040 Unspecified combined systolic (congestive) and diastolic (congestive) heart failure: Secondary | ICD-10-CM | POA: Diagnosis not present

## 2019-01-17 DIAGNOSIS — Z96642 Presence of left artificial hip joint: Secondary | ICD-10-CM | POA: Diagnosis not present

## 2019-01-17 DIAGNOSIS — N401 Enlarged prostate with lower urinary tract symptoms: Secondary | ICD-10-CM | POA: Diagnosis not present

## 2019-01-17 DIAGNOSIS — N139 Obstructive and reflux uropathy, unspecified: Secondary | ICD-10-CM | POA: Diagnosis not present

## 2019-01-17 DIAGNOSIS — Z66 Do not resuscitate: Secondary | ICD-10-CM | POA: Diagnosis present

## 2019-01-17 DIAGNOSIS — I4819 Other persistent atrial fibrillation: Secondary | ICD-10-CM

## 2019-01-17 DIAGNOSIS — Y92009 Unspecified place in unspecified non-institutional (private) residence as the place of occurrence of the external cause: Secondary | ICD-10-CM

## 2019-01-17 DIAGNOSIS — R112 Nausea with vomiting, unspecified: Secondary | ICD-10-CM | POA: Diagnosis not present

## 2019-01-17 DIAGNOSIS — Z20828 Contact with and (suspected) exposure to other viral communicable diseases: Secondary | ICD-10-CM | POA: Diagnosis present

## 2019-01-17 DIAGNOSIS — S72012A Unspecified intracapsular fracture of left femur, initial encounter for closed fracture: Secondary | ICD-10-CM | POA: Diagnosis present

## 2019-01-17 DIAGNOSIS — R600 Localized edema: Secondary | ICD-10-CM

## 2019-01-17 DIAGNOSIS — Z880 Allergy status to penicillin: Secondary | ICD-10-CM

## 2019-01-17 DIAGNOSIS — B961 Klebsiella pneumoniae [K. pneumoniae] as the cause of diseases classified elsewhere: Secondary | ICD-10-CM | POA: Diagnosis not present

## 2019-01-17 DIAGNOSIS — Z9842 Cataract extraction status, left eye: Secondary | ICD-10-CM

## 2019-01-17 DIAGNOSIS — D62 Acute posthemorrhagic anemia: Secondary | ICD-10-CM | POA: Diagnosis not present

## 2019-01-17 DIAGNOSIS — G9340 Encephalopathy, unspecified: Secondary | ICD-10-CM | POA: Diagnosis not present

## 2019-01-17 DIAGNOSIS — Z79899 Other long term (current) drug therapy: Secondary | ICD-10-CM

## 2019-01-17 DIAGNOSIS — R2689 Other abnormalities of gait and mobility: Secondary | ICD-10-CM | POA: Diagnosis not present

## 2019-01-17 DIAGNOSIS — Z886 Allergy status to analgesic agent status: Secondary | ICD-10-CM

## 2019-01-17 DIAGNOSIS — M1612 Unilateral primary osteoarthritis, left hip: Secondary | ICD-10-CM | POA: Diagnosis not present

## 2019-01-17 DIAGNOSIS — I639 Cerebral infarction, unspecified: Secondary | ICD-10-CM | POA: Diagnosis not present

## 2019-01-17 DIAGNOSIS — A4159 Other Gram-negative sepsis: Secondary | ICD-10-CM | POA: Diagnosis not present

## 2019-01-17 DIAGNOSIS — R609 Edema, unspecified: Secondary | ICD-10-CM | POA: Diagnosis not present

## 2019-01-17 DIAGNOSIS — I441 Atrioventricular block, second degree: Secondary | ICD-10-CM | POA: Diagnosis present

## 2019-01-17 DIAGNOSIS — M7989 Other specified soft tissue disorders: Secondary | ICD-10-CM | POA: Diagnosis not present

## 2019-01-17 DIAGNOSIS — J3089 Other allergic rhinitis: Secondary | ICD-10-CM | POA: Diagnosis not present

## 2019-01-17 DIAGNOSIS — Z87891 Personal history of nicotine dependence: Secondary | ICD-10-CM

## 2019-01-17 DIAGNOSIS — I130 Hypertensive heart and chronic kidney disease with heart failure and stage 1 through stage 4 chronic kidney disease, or unspecified chronic kidney disease: Secondary | ICD-10-CM | POA: Diagnosis not present

## 2019-01-17 DIAGNOSIS — T148XXA Other injury of unspecified body region, initial encounter: Secondary | ICD-10-CM

## 2019-01-17 DIAGNOSIS — K5730 Diverticulosis of large intestine without perforation or abscess without bleeding: Secondary | ICD-10-CM | POA: Diagnosis not present

## 2019-01-17 DIAGNOSIS — I340 Nonrheumatic mitral (valve) insufficiency: Secondary | ICD-10-CM | POA: Diagnosis not present

## 2019-01-17 DIAGNOSIS — R296 Repeated falls: Secondary | ICD-10-CM | POA: Diagnosis present

## 2019-01-17 DIAGNOSIS — H9190 Unspecified hearing loss, unspecified ear: Secondary | ICD-10-CM | POA: Diagnosis present

## 2019-01-17 DIAGNOSIS — A419 Sepsis, unspecified organism: Secondary | ICD-10-CM | POA: Diagnosis not present

## 2019-01-17 DIAGNOSIS — S72002G Fracture of unspecified part of neck of left femur, subsequent encounter for closed fracture with delayed healing: Secondary | ICD-10-CM | POA: Diagnosis not present

## 2019-01-17 DIAGNOSIS — R1312 Dysphagia, oropharyngeal phase: Secondary | ICD-10-CM | POA: Diagnosis not present

## 2019-01-17 DIAGNOSIS — I4821 Permanent atrial fibrillation: Secondary | ICD-10-CM | POA: Diagnosis present

## 2019-01-17 DIAGNOSIS — E569 Vitamin deficiency, unspecified: Secondary | ICD-10-CM | POA: Diagnosis not present

## 2019-01-17 DIAGNOSIS — M6281 Muscle weakness (generalized): Secondary | ICD-10-CM | POA: Diagnosis not present

## 2019-01-17 DIAGNOSIS — Z9049 Acquired absence of other specified parts of digestive tract: Secondary | ICD-10-CM

## 2019-01-17 DIAGNOSIS — Z9841 Cataract extraction status, right eye: Secondary | ICD-10-CM

## 2019-01-17 DIAGNOSIS — G4709 Other insomnia: Secondary | ICD-10-CM | POA: Diagnosis not present

## 2019-01-17 DIAGNOSIS — Z471 Aftercare following joint replacement surgery: Secondary | ICD-10-CM | POA: Diagnosis not present

## 2019-01-17 DIAGNOSIS — Z01810 Encounter for preprocedural cardiovascular examination: Secondary | ICD-10-CM | POA: Diagnosis not present

## 2019-01-17 DIAGNOSIS — M2550 Pain in unspecified joint: Secondary | ICD-10-CM | POA: Diagnosis not present

## 2019-01-17 DIAGNOSIS — Z950 Presence of cardiac pacemaker: Secondary | ICD-10-CM | POA: Diagnosis not present

## 2019-01-17 DIAGNOSIS — W1789XA Other fall from one level to another, initial encounter: Secondary | ICD-10-CM | POA: Diagnosis present

## 2019-01-17 DIAGNOSIS — Z85828 Personal history of other malignant neoplasm of skin: Secondary | ICD-10-CM

## 2019-01-17 DIAGNOSIS — S299XXA Unspecified injury of thorax, initial encounter: Secondary | ICD-10-CM | POA: Diagnosis not present

## 2019-01-17 DIAGNOSIS — N400 Enlarged prostate without lower urinary tract symptoms: Secondary | ICD-10-CM | POA: Diagnosis present

## 2019-01-17 DIAGNOSIS — M1380 Other specified arthritis, unspecified site: Secondary | ICD-10-CM | POA: Diagnosis not present

## 2019-01-17 DIAGNOSIS — S72012D Unspecified intracapsular fracture of left femur, subsequent encounter for closed fracture with routine healing: Secondary | ICD-10-CM | POA: Diagnosis not present

## 2019-01-17 DIAGNOSIS — Z7401 Bed confinement status: Secondary | ICD-10-CM | POA: Diagnosis not present

## 2019-01-17 DIAGNOSIS — Z7951 Long term (current) use of inhaled steroids: Secondary | ICD-10-CM

## 2019-01-17 DIAGNOSIS — Z882 Allergy status to sulfonamides status: Secondary | ICD-10-CM

## 2019-01-17 DIAGNOSIS — G319 Degenerative disease of nervous system, unspecified: Secondary | ICD-10-CM | POA: Diagnosis not present

## 2019-01-17 DIAGNOSIS — J449 Chronic obstructive pulmonary disease, unspecified: Secondary | ICD-10-CM | POA: Diagnosis not present

## 2019-01-17 DIAGNOSIS — Z9181 History of falling: Secondary | ICD-10-CM | POA: Diagnosis not present

## 2019-01-17 DIAGNOSIS — Z993 Dependence on wheelchair: Secondary | ICD-10-CM

## 2019-01-17 DIAGNOSIS — S3992XA Unspecified injury of lower back, initial encounter: Secondary | ICD-10-CM | POA: Diagnosis not present

## 2019-01-17 DIAGNOSIS — Z7901 Long term (current) use of anticoagulants: Secondary | ICD-10-CM

## 2019-01-17 DIAGNOSIS — R41841 Cognitive communication deficit: Secondary | ICD-10-CM | POA: Diagnosis not present

## 2019-01-17 DIAGNOSIS — R579 Shock, unspecified: Secondary | ICD-10-CM | POA: Diagnosis not present

## 2019-01-17 DIAGNOSIS — Z96641 Presence of right artificial hip joint: Secondary | ICD-10-CM | POA: Diagnosis present

## 2019-01-17 DIAGNOSIS — Z91048 Other nonmedicinal substance allergy status: Secondary | ICD-10-CM

## 2019-01-17 DIAGNOSIS — I959 Hypotension, unspecified: Secondary | ICD-10-CM | POA: Diagnosis not present

## 2019-01-17 DIAGNOSIS — R4182 Altered mental status, unspecified: Secondary | ICD-10-CM | POA: Diagnosis not present

## 2019-01-17 DIAGNOSIS — N183 Chronic kidney disease, stage 3 (moderate): Secondary | ICD-10-CM | POA: Diagnosis present

## 2019-01-17 DIAGNOSIS — R6521 Severe sepsis with septic shock: Secondary | ICD-10-CM | POA: Diagnosis not present

## 2019-01-17 DIAGNOSIS — I6782 Cerebral ischemia: Secondary | ICD-10-CM | POA: Diagnosis not present

## 2019-01-17 DIAGNOSIS — Z87892 Personal history of anaphylaxis: Secondary | ICD-10-CM

## 2019-01-17 DIAGNOSIS — Z888 Allergy status to other drugs, medicaments and biological substances status: Secondary | ICD-10-CM

## 2019-01-17 LAB — URINALYSIS, ROUTINE W REFLEX MICROSCOPIC
Bilirubin Urine: NEGATIVE
Glucose, UA: NEGATIVE mg/dL
Hgb urine dipstick: NEGATIVE
Ketones, ur: NEGATIVE mg/dL
Leukocytes,Ua: NEGATIVE
Nitrite: NEGATIVE
Protein, ur: NEGATIVE mg/dL
Specific Gravity, Urine: 1.009 (ref 1.005–1.030)
pH: 5 (ref 5.0–8.0)

## 2019-01-17 LAB — CBC WITH DIFFERENTIAL/PLATELET
Abs Immature Granulocytes: 0.03 10*3/uL (ref 0.00–0.07)
Basophils Absolute: 0 10*3/uL (ref 0.0–0.1)
Basophils Relative: 1 %
Eosinophils Absolute: 0.5 10*3/uL (ref 0.0–0.5)
Eosinophils Relative: 6 %
HCT: 40.1 % (ref 39.0–52.0)
Hemoglobin: 13.6 g/dL (ref 13.0–17.0)
Immature Granulocytes: 0 %
Lymphocytes Relative: 13 %
Lymphs Abs: 1 10*3/uL (ref 0.7–4.0)
MCH: 35.5 pg — ABNORMAL HIGH (ref 26.0–34.0)
MCHC: 33.9 g/dL (ref 30.0–36.0)
MCV: 104.7 fL — ABNORMAL HIGH (ref 80.0–100.0)
Monocytes Absolute: 0.9 10*3/uL (ref 0.1–1.0)
Monocytes Relative: 12 %
Neutro Abs: 5.1 10*3/uL (ref 1.7–7.7)
Neutrophils Relative %: 68 %
Platelets: 278 10*3/uL (ref 150–400)
RBC: 3.83 MIL/uL — ABNORMAL LOW (ref 4.22–5.81)
RDW: 13.9 % (ref 11.5–15.5)
WBC: 7.5 10*3/uL (ref 4.0–10.5)
nRBC: 0 % (ref 0.0–0.2)

## 2019-01-17 LAB — COMPREHENSIVE METABOLIC PANEL
ALT: 31 U/L (ref 0–44)
AST: 32 U/L (ref 15–41)
Albumin: 3 g/dL — ABNORMAL LOW (ref 3.5–5.0)
Alkaline Phosphatase: 100 U/L (ref 38–126)
Anion gap: 10 (ref 5–15)
BUN: 48 mg/dL — ABNORMAL HIGH (ref 8–23)
CO2: 26 mmol/L (ref 22–32)
Calcium: 9 mg/dL (ref 8.9–10.3)
Chloride: 103 mmol/L (ref 98–111)
Creatinine, Ser: 1.64 mg/dL — ABNORMAL HIGH (ref 0.61–1.24)
GFR calc Af Amer: 42 mL/min — ABNORMAL LOW (ref >60)
GFR calc non Af Amer: 36 mL/min — ABNORMAL LOW (ref >60)
Glucose, Bld: 100 mg/dL — ABNORMAL HIGH (ref 70–99)
Potassium: 5 mmol/L (ref 3.5–5.1)
Sodium: 139 mmol/L (ref 135–145)
Total Bilirubin: 1.6 mg/dL — ABNORMAL HIGH (ref 0.3–1.2)
Total Protein: 6.5 g/dL (ref 6.5–8.1)

## 2019-01-17 LAB — TYPE AND SCREEN
ABO/RH(D): A POS
Antibody Screen: NEGATIVE

## 2019-01-17 LAB — HEPARIN LEVEL (UNFRACTIONATED): Heparin Unfractionated: >2.2 IU/mL — ABNORMAL HIGH (ref 0.30–0.70)

## 2019-01-17 LAB — ABO/RH: ABO/RH(D): A POS

## 2019-01-17 LAB — SARS CORONAVIRUS 2 BY RT PCR (HOSPITAL ORDER, PERFORMED IN ~~LOC~~ HOSPITAL LAB): SARS Coronavirus 2: NEGATIVE

## 2019-01-17 LAB — APTT: aPTT: 46 seconds — ABNORMAL HIGH (ref 24–36)

## 2019-01-17 LAB — BRAIN NATRIURETIC PEPTIDE: B Natriuretic Peptide: 385.7 pg/mL — ABNORMAL HIGH (ref 0.0–100.0)

## 2019-01-17 MED ORDER — POVIDONE-IODINE 10 % EX SWAB: 2.0000 "application " | Freq: Once | CUTANEOUS | Status: DC

## 2019-01-17 MED ORDER — HEPARIN (PORCINE) 25000 UT/250ML-% IV SOLN
1050.0000 [IU]/h | INTRAVENOUS | Status: DC
  Administered 2019-01-17: 20:00:00 1050 [IU]/h via INTRAVENOUS
  Filled 2019-01-17: qty 250

## 2019-01-17 MED ORDER — HYDROCODONE-ACETAMINOPHEN 5-325 MG PO TABS
1.0000 | ORAL_TABLET | Freq: Four times a day (QID) | ORAL | Status: DC | PRN
  Administered 2019-01-17: 17:00:00 1 via ORAL
  Administered 2019-01-18 – 2019-01-24 (×3): 2 via ORAL
  Administered 2019-01-24 (×2): 1 via ORAL
  Administered 2019-01-26 – 2019-01-27 (×3): 2 via ORAL
  Filled 2019-01-17 (×2): qty 2
  Filled 2019-01-17 (×2): qty 1
  Filled 2019-01-17 (×2): qty 2
  Filled 2019-01-17: qty 1
  Filled 2019-01-17 (×2): qty 2
  Filled 2019-01-17: qty 1

## 2019-01-17 MED ORDER — MORPHINE SULFATE (PF) 4 MG/ML IV SOLN
4.0000 mg | Freq: Once | INTRAVENOUS | Status: AC
  Administered 2019-01-17: 4 mg via INTRAVENOUS
  Filled 2019-01-17: qty 1

## 2019-01-17 MED ORDER — FUROSEMIDE 10 MG/ML IJ SOLN
40.0000 mg | Freq: Two times a day (BID) | INTRAMUSCULAR | Status: DC
  Administered 2019-01-17 – 2019-01-18 (×3): 40 mg via INTRAVENOUS
  Filled 2019-01-17 (×3): qty 4

## 2019-01-17 MED ORDER — TRANEXAMIC ACID-NACL 1000-0.7 MG/100ML-% IV SOLN: 1000.0000 mg | INTRAVENOUS | Status: DC

## 2019-01-17 MED ORDER — MORPHINE SULFATE (PF) 2 MG/ML IV SOLN
0.5000 mg | INTRAVENOUS | Status: DC | PRN
  Administered 2019-01-18: 0.5 mg via INTRAVENOUS
  Filled 2019-01-17: qty 1

## 2019-01-17 MED ORDER — ENSURE PRE-SURGERY PO LIQD
296.0000 mL | Freq: Once | ORAL | Status: AC
  Administered 2019-01-17: 296 mL via ORAL
  Filled 2019-01-17: qty 296

## 2019-01-17 MED ORDER — CHLORHEXIDINE GLUCONATE 4 % EX LIQD
60.0000 mL | Freq: Once | CUTANEOUS | Status: AC
  Administered 2019-01-18: 4 via TOPICAL
  Filled 2019-01-17: qty 60

## 2019-01-17 MED ORDER — VANCOMYCIN HCL IN DEXTROSE 1-5 GM/200ML-% IV SOLN
1000.0000 mg | INTRAVENOUS | Status: DC
  Filled 2019-01-17: qty 200

## 2019-01-17 NOTE — ED Notes (Signed)
Report attempted, new order placed for rapid covid swab, need results prior to pt transport.

## 2019-01-17 NOTE — ED Triage Notes (Addendum)
Pt has been falling & needing help faster than he can get medical care (per pt's son), with each past fall occuring his capabilities has decreased, son is concerned about his fathers lack of usual abilities to get up walk &/or toilet himself. Upon arrival to the ED pt is A/Ox4, he has bilateral lower extremety pitting edema. Pts last fall was 01/09/19, he has had xrays & scan by his PCP since then.

## 2019-01-17 NOTE — ED Provider Notes (Signed)
[facility] EMERGENCY DEPARTMENT Provider Note   CSN: [id number] Arrival date & time: 01/17/19  [id number]     History   Chief Complaint No chief complaint on file.   HPI Frank Elliott is a 85 y.o. male.     The history is provided by the patient and a relative. No language interpreter was used.     85 year old male with history of CHF, second-degree AV block with permanent pacemaker, hypertension, CKD presenting for weakness and inability to walk.  History obtained through son who is at bedside.  On August 29th, patient was living independently at home and 1 day after riding on his stationary bike, he got up from the bike when his left leg got caught on the seat and fell.  At that time he did not suffer any significant injury and was able to get up with assist.  However later on in the day, he fell again when his leg gave out.  Since then he has had pain to his left hip.  He has been seen and evaluated by PCP and has had x-rays of his hip and lower back without any acute finding.  Since then he has had progressive worsening pain and difficulty walking.  Now he is mostly bedbound unable to ambulate.  Does have history of CHF and has had progressive worsening bilateral leg swelling.  Complications of pain and swelling has greatly affected his ability to ambulate.  He is now staying with his son.  He does not complain of any associated fever chills no URI symptoms no recent sick contact no chest pain shortness of breath productive cough abdominal pain bowel bladder incontinence.  No report of heart palpitation or CP. Pt is DNR/DNI  Past Medical History:  Diagnosis Date  . Arthritis    "just about all over" (02/05/2018)  . AV block, Mobitz 2 02/03/2018   PPM placed 01/2018  . BPH (benign prostatic hyperplasia)   . Chronic combined systolic and diastolic CHF (congestive heart failure) ([place])   . Complication of anesthesia    "had hard time waking up a long time ago" (02/05/2018)   . Cyst of kidney, acquired    "recently found; ? side" (02/05/2018)  . GERD (gastroesophageal reflux disease)   . History of hiatal hernia   . Hypertension   . Mild CAD    a. 30-40% LAD in 2019.  Marland Kitchen Neuropathy   . NICM (nonischemic cardiomyopathy) ([place])   . Persistent atrial fibrillation   . Pleural effusion    dx 04/01/12  . Skin cancer    "face, nose" (02/05/2018)  . Syncope 02/03/2018    Patient Active Problem List   Diagnosis Date Noted  . Colitis 03/05/2018  . CKD (chronic kidney disease) stage 3, GFR 30-59 ml/min (HCC) 03/05/2018  . Pacemaker 02/11/2018  . Syncope 02/03/2018  . AV block, Mobitz 2 02/03/2018  . Bilateral lower extremity edema 12/19/2015  . Chronic systolic heart failure ([place]) 04/25/2013  . HTN (hypertension) 04/20/2012  . Chronic diastolic CHF (congestive heart failure) ([place]) 04/18/2012  . COPD (chronic obstructive pulmonary disease) ([place]) 04/18/2012    Past Surgical History:  Procedure Laterality Date  . APPENDECTOMY    . BACK SURGERY    . CARDIAC CATHETERIZATION  02/05/2018  . CATARACT EXTRACTION, BILATERAL Bilateral   . CHOLECYSTECTOMY    . JOINT REPLACEMENT    . LEFT HEART CATH AND CORONARY ANGIOGRAPHY N/A 02/05/2018   Procedure: LEFT HEART CATH AND CORONARY ANGIOGRAPHY;  Surgeon:  Belva Crome, MD;  Location: [facility];  Service: Cardiovascular;  Laterality: N/A;  . LUMBAR [facility] SURGERY  1980s  . PACEMAKER IMPLANT N/A 02/06/2018   Procedure: PACEMAKER IMPLANT;  Surgeon: Constance Haw, MD;  Location: [facility];  Service: Cardiovascular;  Laterality: N/A;  . SKIN CANCER EXCISION     "face, nose" (02/05/2018)  . TONSILLECTOMY    . TOTAL HIP ARTHROPLASTY Right 1990s/2000s  . ULTRASOUND GUIDANCE FOR VASCULAR ACCESS  02/05/2018   Procedure: Ultrasound Guidance For Vascular Access;  Surgeon: Belva Crome, MD;  Location: [facility];  Service: Cardiovascular;;        Home Medications    Prior to Admission  medications   Medication Sig Start Date End Date Taking? Authorizing Provider  amiodarone (PACERONE) 200 MG tablet Take 1 tablet (200 mg total) twice a day for one month and then reduce to one tablet daily 12/15/18   Camnitz, Ocie Doyne, MD  betamethasone dipropionate (DIPROLENE) 0.05 % cream Apply 1 application topically 2 (two) times daily as needed. unk 01/22/18   [provider]  carvedilol (COREG) 6.25 MG tablet TAKE 1 TABLET BY MOUTH TWICE A DAY 07/21/18   Belva Crome, MD  Cyanocobalamin (VITAMIN B-12 PO) Take 1 tablet by mouth daily.    [provider]  ELIQUIS 5 MG TABS tablet TAKE 1 TABLET BY MOUTH TWICE A DAY 12/21/18   Camnitz, Will Hassell Done, MD  fluticasone [facility]) 50 MCG/ACT nasal spray Place 1 spray into both nostrils daily as needed for allergies.  07/27/13   [provider]  furosemide (LASIX) 20 MG tablet Take as directed 1 tablet tues, wed, fri-Sunday 2 tablets on mon & thursdays May have 1 extra tablet daily as needed for swelling 10/26/18   Belva Crome, MD  gabapentin (NEURONTIN) 300 MG capsule Take 300 mg by mouth 3 (three) times daily.    [provider]  losartan (COZAAR) 100 MG tablet Take 0.5 tablets (50 mg total) by mouth daily. 10/13/18 01/13/19  Dunn, Nedra Hai, PA-C  omeprazole (PRILOSEC) 20 MG capsule Take 20 mg by mouth 2 (two) times daily.    [provider]  Probiotic Product (ALIGN PO) Take 1 capsule by mouth daily.    [provider]    Family History Family History  Problem Relation Age of Onset  . Heart disease Father     Social History Social History   Tobacco Use  . Smoking status: Former Research scientist (life sciences)  . Smokeless tobacco: Never Used  . Tobacco comment: "smoked & chewed  in teens & early 20's"  Substance Use Topics  . Alcohol use: Not Currently    Comment: "drank in teens & early 20's"  . Drug use: Never     Allergies   Aspirin, Whiskey [alcohol], Amoxicillin, Avelox [moxifloxacin hcl in nacl],  Lisinopril, Penicillins, Sulfa antibiotics, Tape, and Zithromax [azithromycin]   Review of Systems Review of Systems  All other systems reviewed and are negative.    Physical Exam Updated Vital Signs BP 124/62   Pulse 60   Temp 97.7 F (36.5 C) (Oral)   Resp 18   SpO2 98%   Physical Exam Vitals signs and nursing note reviewed.  Constitutional:      General: He is not in acute distress.    Appearance: He is well-developed.  HENT:     Head: Normocephalic and atraumatic.  Eyes:     Extraocular Movements: Extraocular movements intact.  Conjunctiva/sclera: Conjunctivae normal.     Pupils: Pupils are equal, round, and reactive to light.  Neck:     Musculoskeletal: Neck supple. No neck rigidity or muscular tenderness.  Cardiovascular:     Rate and Rhythm: Normal rate and regular rhythm.  Pulmonary:     Effort: Pulmonary effort is normal.     Breath sounds: Normal breath sounds.  Abdominal:     Palpations: Abdomen is soft.     Tenderness: There is no abdominal tenderness.  Musculoskeletal:        General: Swelling (2+ pitting edema to BLE. ) and tenderness (L hip: tenderness to L lateral hip with presence of ecchymosis.  decreased L hip ROM 2/2 pain.   ) present.     Comments: 5/5 strength to BUE. 4/5 strength to BLE. Intact pedal pulses. No significant tenderness to midline spine.   Skin:    Capillary Refill: Capillary refill takes less than 2 seconds.     Findings: No rash.  Neurological:     Mental Status: He is alert and oriented to person, place, and time.  Psychiatric:        Mood and Affect: Mood normal.      ED Treatments / Results  Labs (all labs ordered are listed, but only abnormal results are displayed) Labs Reviewed  COMPREHENSIVE METABOLIC PANEL - Abnormal; Notable for the following components:      Result Value   Glucose, Bld 100 (*)    BUN 48 (*)    Creatinine, Ser 1.64 (*)    Albumin 3.0 (*)    Total Bilirubin 1.6 (*)    GFR calc non Af Amer  36 (*)    GFR calc Af Amer 42 (*)    All other components within normal limits  CBC WITH DIFFERENTIAL/PLATELET - Abnormal; Notable for the following components:   RBC 3.83 (*)    MCV 104.7 (*)    MCH 35.5 (*)    All other components within normal limits  BRAIN NATRIURETIC PEPTIDE - Abnormal; Notable for the following components:   B Natriuretic Peptide 385.7 (*)    All other components within normal limits  SARS CORONAVIRUS 2 (TAT 6-24 HRS)  CBG MONITORING, ED    EKG EKG Interpretation  Date/Time:  Sunday January 17 2019 10:58:36 EDT Ventricular Rate:  60 PR Interval:    QRS Duration: 184 QT Interval:  485 QTC Calculation: 485 R Axis:   114 Text Interpretation:  Atrial fibrillation Multiple ventricular premature complexes IVCD, consider atypical RBBB No significant change since last tracing Confirmed by Pattricia Boss [phone]) on 01/17/2019 11:02:13 AM   Radiology Ct Head Wo Contrast  Result Date: 01/17/2019 CLINICAL DATA:  Recurrent falls.  Increasing weakness. EXAM: CT HEAD WITHOUT CONTRAST TECHNIQUE: Contiguous axial images were obtained from the base of the skull through the vertex without intravenous contrast. COMPARISON:  Head CT scan 02/03/2018. FINDINGS: Brain: No evidence of acute infarction, hemorrhage, hydrocephalus, extra-axial collection or mass lesion/mass effect. Remote left basal ganglia lacunar infarction is new since the prior CT. Chronic microvascular ischemic change and cortical atrophy are stable in appearance. Vascular: No hyperdense vessel or unexpected calcification. Skull: Intact.  No focal lesion. Sinuses/Orbits: Negative. Other: None. IMPRESSION: No acute abnormality. No change in atrophy and chronic microvascular ischemic change. Electronically Signed   By: Inge Rise M.D.   On: 01/17/2019 12:08   Ct Lumbar Spine Wo Contrast  Result Date: 01/17/2019 CLINICAL DATA:  Recurrent falls, most recently 01/09/2019. Low back pain. EXAM:  CT LUMBAR SPINE WITHOUT  CONTRAST TECHNIQUE: Multidetector CT imaging of the lumbar spine was performed without intravenous contrast administration. Multiplanar CT image reconstructions were also generated. COMPARISON:  MRI lumbar spine 05/11/2008. Plain films lumbar spine 01/11/2019. FINDINGS: Segmentation: Standard. Alignment: Maintained with straightening of lordosis noted. Vertebrae: Remote L4 superior endplate compression fracture is unchanged. No acute fracture. Right hip replacement noted. Paraspinal and other soft tissues: Atherosclerosis and sigmoid diverticulosis are seen. Disc levels: T12-L1: Facet degenerative disease. Vacuum disc phenomenon and a minimal bulge. No stenosis. L1-2: Partial autologous fusion across the disc interspace. Disc bulge, facet arthropathy and ligamentum flavum thickening cause mild central canal narrowing. Foramina appear open. L2-3: Facet arthropathy, ligamentum flavum thickening and a shallow disc bulge. Mild central canal narrowing. Foramina open. L3-4: Status post posterior decompression. There is a shallow disc bulge and endplate spur. Facet arthropathy. There is mild central canal stenosis. Right worse than left subarticular recess narrowing is also present. Moderate bilateral foraminal narrowing is worse on the right. L4-5: Status post decompression. Advanced facet arthropathy. The right facets are ankylosed. The central canal is open. Moderately severe to severe bilateral foraminal narrowing. L5-S1: Status post decompression. The central canal is open. Facet arthropathy is present and contributes to moderate to moderately severe bilateral foraminal narrowing. IMPRESSION: No acute abnormality. Remote L4 compression fracture is unchanged since 2009. No marked change in lumbar spondylosis most notable at L4-5 where there is marked bilateral foraminal narrowing due to disc and facet arthropathy. Please see above for descriptions of individual levels. Electronically Signed   By: Inge Rise M.D.    On: 01/17/2019 12:17   Ct Hip Left Wo Contrast  Result Date: 01/17/2019 CLINICAL DATA:  Left hip pain EXAM: CT OF THE LEFT HIP WITHOUT CONTRAST TECHNIQUE: Multidetector CT imaging of the left hip was performed according to the standard protocol. Multiplanar CT image reconstructions were also generated. COMPARISON:  X-ray 12/31/2018 FINDINGS: Bones/Joint/Cartilage Acute, impacted subcapital fracture of the left femoral neck with approximately 2 cm of foreshortening. No intertrochanteric or intra-articular fracture component identified. Femoral head alignment is maintained without dislocation. Moderate left hip osteoarthritis. No additional fracture identified. Ligaments Suboptimally assessed by CT. Muscles and Tendons No intramuscular hematoma.  Tendons grossly intact. Soft tissues Mild soft tissue induration over the lateral aspect of the left hip, likely posttraumatic. Other Extensive sigmoid diverticulosis. IMPRESSION: Acute, impacted, subcapital fracture of the left femoral neck. Electronically Signed   By: Davina Poke M.D.   On: 01/17/2019 12:21   Dg Chest Portable 1 View  Result Date: 01/17/2019 CLINICAL DATA:  Pt has been falling AND needing help faster than he can get medical care (per pt's son), with each past fall occurring, his capabilities has decreased, son is concerned about his fathers lack of usual abilities to get up walk. EXAM: PORTABLE CHEST 1 VIEW COMPARISON:  03/29/2018 FINDINGS: Patient has a LEFT-sided transvenous pacemaker with leads to the RIGHT atrium and RIGHT ventricle. The heart is enlarged and stable in configuration. No pulmonary edema. No consolidations or pleural effusions. Chronic changes in both shoulders. IMPRESSION: Stable cardiomegaly. No evidence for acute abnormality. Electronically Signed   By: Nolon Nations M.D.   On: 01/17/2019 11:09    Procedures Procedures (including critical care time)  Medications Ordered in ED Medications  morphine 4 MG/ML  injection 4 mg (4 mg Intravenous Given 01/17/19 1118)     Initial Impression / Assessment and Plan / ED Course  I have reviewed the triage vital signs and the nursing  notes.  Pertinent labs & imaging results that were available during my care of the patient were reviewed by me and considered in my medical decision making (see chart for details).        BP 124/62   Pulse 60   Temp 97.7 F (36.5 C) (Oral)   Resp 18   SpO2 98%    Final Clinical Impressions(s) / ED Diagnoses   Final diagnoses:  Closed left hip fracture, initial encounter [facility])  Peripheral edema    ED Discharge Orders    None     10:49 AM Patient fell last week and since then he has had trouble ambulating secondary to pain to his left hip.  Furthermore he has history of CHF and has significant edema to bilateral lower extremities thus contributing to the ability to ambulate.  Will obtain CT scan of lumbar spine and left hip for further valuation.  Work-up initiated.  12:55 PM Evidence of mild AKI with creatinine 1.64, BUN 48, likely secondary to being on diuretic.  Normal H&H, normal WBC.  BNP is elevated 385 chest x-ray does not reflect CHF exacerbation as there are no pulmonary edema or pleural effusion.  Head CT scan unremarkable CT of lumbar spine without acute finding, CT of left hip demonstrate acute impacted subcapital fracture of the left femoral neck.  This is a closed injury.  I have consulted [facility] and spoke with Dr. [name] who agrees to see and will admit pt.  Will consult ortho for further management. Pt and family member are aware of plan and agrees with plan.  Care discussed with Dr. Jeanell Sparrow.   1:20 PM Appreciate consultation from orthopedist Dr. Doreatha Martin who agrees to see pt and likely operate tomorrow. COVID test have been ordered.   CAYLEN HEINY was evaluated in Emergency Department on 01/17/2019 for the symptoms described in the history of present illness. He was evaluated in the context of  the global COVID-19 pandemic, which necessitated consideration that the patient might be at risk for infection with the SARS-CoV-2 virus that causes COVID-19. Institutional protocols and algorithms that pertain to the evaluation of patients at risk for COVID-19 are in a state of rapid change based on information released by regulatory bodies including the CDC and federal and state organizations. These policies and algorithms were followed during the patient's care in the ED.    Domenic Moras, PA-C 01/17/19 1322    Pattricia Boss, MD 01/19/19 [phone]

## 2019-01-17 NOTE — ED Notes (Signed)
Patient transported to X-ray 

## 2019-01-17 NOTE — Progress Notes (Addendum)
Cardiology Consultation:   Patient ID: Frank Elliott MRN: [id number]; DOB: [birth year]  Admit date: 01/17/2019 Date of Consult: 01/17/2019  Primary Care Provider: [name], MD Primary Cardiologist: Sinclair Grooms, MD  Primary Electrophysiologist:  Constance Haw, MD    Patient Profile:   Frank Elliott is a 85 y.o. male with a hx of chronic combined systolic and diastolic heart failure (EF 45-50 %), nonobstructive CAD, second-degree AV block status post permanent pacemaker 01/2018, hypertension, CKD, AF on amiodarone who is being seen today for the evaluation of pre-op evaluation at the request of Dr [name].  History of Present Illness:   [name] is a 85 year old male with a history of chronic combined systolic and diastolic heart failure (EF 45-50 %), nonobstructive CAD, second-degree AV block status post permanent pacemaker 01/2018, hypertension, CKD, AF on amiodarone who p/w left femoral neck fracture.  He had a fall 8 days ago getting off his stationary bike.  He landed on his left side.  He then had a second fall later that evening.  He saw his PCP the following day, no fracture seen on x-ray.  Has been getting progressively weaker since that time, with worsening pain and progressive lower extremity edema. Presented to ED today, CT of his left hip showed an acute femoral neck fracture.   Labs notable for creatinine 1.6 (elevated from 1.46 in June), BNP 386, hemoglobin 13.6.  He reports that he is normally very active, will ride his stationary bike for 30-40 minutes per day, denies any chest pain or dyspnea.  Heart Pathway Score:     Past Medical History:  Diagnosis Date  . Arthritis    "just about all over" (02/05/2018)  . AV block, Mobitz 2 02/03/2018   PPM placed 01/2018  . BPH (benign prostatic hyperplasia)   . Chronic combined systolic and diastolic CHF (congestive heart failure) ([place])   . Complication of anesthesia    "had hard time waking up a long time ago"  (02/05/2018)  . Cyst of kidney, acquired    "recently found; ? side" (02/05/2018)  . GERD (gastroesophageal reflux disease)   . History of hiatal hernia   . Hypertension   . Mild CAD    a. 30-40% LAD in 2019.  Marland Kitchen Neuropathy   . NICM (nonischemic cardiomyopathy) ([place])   . Persistent atrial fibrillation   . Pleural effusion    dx 04/01/12  . Skin cancer    "face, nose" (02/05/2018)  . Syncope 02/03/2018    Past Surgical History:  Procedure Laterality Date  . APPENDECTOMY    . BACK SURGERY    . CARDIAC CATHETERIZATION  02/05/2018  . CATARACT EXTRACTION, BILATERAL Bilateral   . CHOLECYSTECTOMY    . JOINT REPLACEMENT    . LEFT HEART CATH AND CORONARY ANGIOGRAPHY N/A 02/05/2018   Procedure: LEFT HEART CATH AND CORONARY ANGIOGRAPHY;  Surgeon: Belva Crome, MD;  Location: [facility];  Service: Cardiovascular;  Laterality: N/A;  . LUMBAR [place] SURGERY  1980s  . PACEMAKER IMPLANT N/A 02/06/2018   Procedure: PACEMAKER IMPLANT;  Surgeon: Constance Haw, MD;  Location: [facility];  Service: Cardiovascular;  Laterality: N/A;  . SKIN CANCER EXCISION     "face, nose" (02/05/2018)  . TONSILLECTOMY    . TOTAL HIP ARTHROPLASTY Right 1990s/2000s  . ULTRASOUND GUIDANCE FOR VASCULAR ACCESS  02/05/2018   Procedure: Ultrasound Guidance For Vascular Access;  Surgeon: Belva Crome, MD;  Location: [facility];  Service: Cardiovascular;;       Inpatient Medications: Scheduled Meds: . furosemide  40 mg Intravenous BID   Continuous Infusions:  PRN Meds: HYDROcodone-acetaminophen, morphine injection  Allergies:    Allergies  Allergen Reactions  . Aspirin Anaphylaxis, Hives and Swelling  . Whiskey [Alcohol] Anaphylaxis and Hives  . Amoxicillin Other (See Comments)    Causes sore throat  . Avelox [Moxifloxacin Hcl In Nacl] Other (See Comments)    Caused diarrhea & constipation  . Lisinopril Cough  . Penicillins Other (See Comments)    Unknown reaction  . Sulfa  Antibiotics Hives  . Tape Itching    Also causes redness--Paper tape okay  . Zithromax [Azithromycin] Other (See Comments)    Sore throat    Social History:   Social History   Socioeconomic History  . Marital status: Widowed    Spouse name: Not on file  . Number of children: Not on file  . Years of education: Not on file  . Highest education level: Not on file  Occupational History  . Occupation: Retired  Scientific laboratory technician  . Financial resource strain: Not on file  . Food insecurity    Worry: Not on file    Inability: Not on file  . Transportation needs    Medical: Not on file    Non-medical: Not on file  Tobacco Use  . Smoking status: Former Research scientist (life sciences)  . Smokeless tobacco: Never Used  . Tobacco comment: "smoked & chewed  in teens & early 20's"  Substance and Sexual Activity  . Alcohol use: Not Currently    Comment: "drank in teens & early 20's"  . Drug use: Never  . Sexual activity: Not on file  Lifestyle  . Physical activity    Days per week: Not on file    Minutes per session: Not on file  . Stress: Not on file  Relationships  . Social Herbalist on phone: Not on file    Gets together: Not on file    Attends religious service: Not on file    Active member of club or organization: Not on file    Attends meetings of clubs or organizations: Not on file    Relationship status: Not on file  . Intimate partner violence    Fear of current or ex partner: Not on file    Emotionally abused: Not on file    Physically abused: Not on file    Forced sexual activity: Not on file  Other Topics Concern  . Not on file  Social History Narrative   He lives alone, son frequently goes with him to doctor's appointments.  Other family members help in his care.    Family History:    Family History  Problem Relation Age of Onset  . Heart disease Father      ROS:  Please see the history of present illness.  All other ROS reviewed and negative.     Physical Exam/Data:    Vitals:   01/17/19 1215 01/17/19 1230 01/17/19 1245 01/17/19 1300  BP: 118/68 133/62 131/71 122/65  Pulse: (!) 50 60 63 (!) 50  Resp: (!) 21 11 (!) 22 15  Temp:      TempSrc:      SpO2: 95% 98% 98% 97%   No intake or output data in the 24 hours ending 01/17/19 1436 Last 3 Weights 01/13/2019 12/15/2018 10/22/2018  Weight (lbs) 162 lb 1.9 oz 153 lb 156 lb 6.4 oz  Weight (kg) 73.537  kg 69.4 kg 70.943 kg     There is no height or weight on file to calculate BMI.  General:  in no acute distress HEENT: normal Lymph: no adenopathy Neck: + JVD Endocrine:  No thryomegaly Vascular: No carotid bruits; FA pulses 2+ bilaterally without bruits  Cardiac:  normal S1, S2; RRR; no murmur  Lungs:  clear to auscultation bilaterally, no wheezing, rhonchi or rales  Abd: soft, nontender, no hepatomegaly  Ext: 2+ BLE edema Musculoskeletal:  No deformities, BUE and BLE strength normal and equal Skin: warm and dry  Neuro:  CNs 2-12 intact, no focal abnormalities noted Psych:  Normal affect   EKG:  The EKG was personally reviewed and demonstrates:  V-paced rhythm, occasional native beat, atrial fibrillation Telemetry:  Telemetry was personally reviewed and demonstrates:  V-paced rhythm with occasional native beat, AF  Relevant CV Studies:   Laboratory Data:  High Sensitivity Troponin:  No results for input(s): TROPONINIHS in the last 720 hours.   Chemistry Recent Labs  Lab 01/17/19 1036  NA 139  K 5.0  CL 103  CO2 26  GLUCOSE 100*  BUN 48*  CREATININE 1.64*  CALCIUM 9.0  GFRNONAA 36*  GFRAA 42*  ANIONGAP 10    Recent Labs  Lab 01/17/19 1036  PROT 6.5  ALBUMIN 3.0*  AST 32  ALT 31  ALKPHOS 100  BILITOT 1.6*   Hematology Recent Labs  Lab 01/17/19 1036  WBC 7.5  RBC 3.83*  HGB 13.6  HCT 40.1  MCV 104.7*  MCH 35.5*  MCHC 33.9  RDW 13.9  PLT 278   BNP Recent Labs  Lab 01/17/19 1036  BNP 385.7*    DDimer No results for input(s): DDIMER in the last 168 hours.    Radiology/Studies:  Ct Head Wo Contrast  Result Date: 01/17/2019 CLINICAL DATA:  Recurrent falls.  Increasing weakness. EXAM: CT HEAD WITHOUT CONTRAST TECHNIQUE: Contiguous axial images were obtained from the base of the skull through the vertex without intravenous contrast. COMPARISON:  Head CT scan 02/03/2018. FINDINGS: Brain: No evidence of acute infarction, hemorrhage, hydrocephalus, extra-axial collection or mass lesion/mass effect. Remote left basal ganglia lacunar infarction is new since the prior CT. Chronic microvascular ischemic change and cortical atrophy are stable in appearance. Vascular: No hyperdense vessel or unexpected calcification. Skull: Intact.  No focal lesion. Sinuses/Orbits: Negative. Other: None. IMPRESSION: No acute abnormality. No change in atrophy and chronic microvascular ischemic change. Electronically Signed   By: Inge Rise M.D.   On: 01/17/2019 12:08   Ct Lumbar Spine Wo Contrast  Result Date: 01/17/2019 CLINICAL DATA:  Recurrent falls, most recently 01/09/2019. Low back pain. EXAM: CT LUMBAR SPINE WITHOUT CONTRAST TECHNIQUE: Multidetector CT imaging of the lumbar spine was performed without intravenous contrast administration. Multiplanar CT image reconstructions were also generated. COMPARISON:  MRI lumbar spine 05/11/2008. Plain films lumbar spine 01/11/2019. FINDINGS: Segmentation: Standard. Alignment: Maintained with straightening of lordosis noted. Vertebrae: Remote L4 superior endplate compression fracture is unchanged. No acute fracture. Right hip replacement noted. Paraspinal and other soft tissues: Atherosclerosis and sigmoid diverticulosis are seen. Disc levels: T12-L1: Facet degenerative disease. Vacuum disc phenomenon and a minimal bulge. No stenosis. L1-2: Partial autologous fusion across the disc interspace. Disc bulge, facet arthropathy and ligamentum flavum thickening cause mild central canal narrowing. Foramina appear open. L2-3: Facet arthropathy,  ligamentum flavum thickening and a shallow disc bulge. Mild central canal narrowing. Foramina open. L3-4: Status post posterior decompression. There is a shallow disc bulge and endplate spur. Facet  arthropathy. There is mild central canal stenosis. Right worse than left subarticular recess narrowing is also present. Moderate bilateral foraminal narrowing is worse on the right. L4-5: Status post decompression. Advanced facet arthropathy. The right facets are ankylosed. The central canal is open. Moderately severe to severe bilateral foraminal narrowing. L5-S1: Status post decompression. The central canal is open. Facet arthropathy is present and contributes to moderate to moderately severe bilateral foraminal narrowing. IMPRESSION: No acute abnormality. Remote L4 compression fracture is unchanged since 2009. No marked change in lumbar spondylosis most notable at L4-5 where there is marked bilateral foraminal narrowing due to disc and facet arthropathy. Please see above for descriptions of individual levels. Electronically Signed   By: Inge Rise M.D.   On: 01/17/2019 12:17   Ct Hip Left Wo Contrast  Result Date: 01/17/2019 CLINICAL DATA:  Left hip pain EXAM: CT OF THE LEFT HIP WITHOUT CONTRAST TECHNIQUE: Multidetector CT imaging of the left hip was performed according to the standard protocol. Multiplanar CT image reconstructions were also generated. COMPARISON:  X-ray 12/31/2018 FINDINGS: Bones/Joint/Cartilage Acute, impacted subcapital fracture of the left femoral neck with approximately 2 cm of foreshortening. No intertrochanteric or intra-articular fracture component identified. Femoral head alignment is maintained without dislocation. Moderate left hip osteoarthritis. No additional fracture identified. Ligaments Suboptimally assessed by CT. Muscles and Tendons No intramuscular hematoma.  Tendons grossly intact. Soft tissues Mild soft tissue induration over the lateral aspect of the left hip, likely  posttraumatic. Other Extensive sigmoid diverticulosis. IMPRESSION: Acute, impacted, subcapital fracture of the left femoral neck. Electronically Signed   By: Davina Poke M.D.   On: 01/17/2019 12:21   Dg Chest Portable 1 View  Result Date: 01/17/2019 CLINICAL DATA:  Pt has been falling AND needing help faster than he can get medical care (per pt's son), with each past fall occurring, his capabilities has decreased, son is concerned about his fathers lack of usual abilities to get up walk. EXAM: PORTABLE CHEST 1 VIEW COMPARISON:  03/29/2018 FINDINGS: Patient has a LEFT-sided transvenous pacemaker with leads to the RIGHT atrium and RIGHT ventricle. The heart is enlarged and stable in configuration. No pulmonary edema. No consolidations or pleural effusions. Chronic changes in both shoulders. IMPRESSION: Stable cardiomegaly. No evidence for acute abnormality. Electronically Signed   By: Nolon Nations M.D.   On: 01/17/2019 11:09    Assessment and Plan:  85 y.o. male with a hx of chronic combined systolic and diastolic heart failure (EF 45-50 %), nonobstructive CAD, second-degree AV block status post permanent pacemaker 01/2018, hypertension, CKD, AF on amiodarone  Pre-op evaluation: acute left femoral neck fracture.  Planning surgery tomorrow.  He has mild systolic dysfunction and appears hypervolemic on exam.  No anginal symptoms.  Cath in 01/2018 showed nonobstructive CAD.  Good functional capacity, reports riding stationary bike for 30-40 minutes per day without symptoms. RCRI score 1 given CHF.  Overall, would classify as intermediate risk.  No further testing recommended prior to surgery  Chronic combined systolic and diastolic heart failure: appears hypervolemic on exam, though no oxygen requirement and no pulmonary edema on CXR.  Agree with diuresis.  AKI on CKD: Cr 1.64 (1.46 in June), appears volume overloaded.  Would hold home losartan, continue diuresis, and closely monitor renal function   Persistent AF: rate-controlled. CHADS-VASc 4 (CHF, HTN, Agex2,) - Continue carvedilol and amiodarone - Holding Eliquis given plan for surgery, started on heparin gtt  CAD: nonobstructive on cath 01/2018.  No anginal symptoms  Heart block s/p pacemaker:  he is not pacemaker-dependent, as underlying rhythm on interrogation last month was AF with rates in 60s.    For questions or updates, please contact [place] Please consult [email or web address] for contact info under     Signed, Donato Heinz, MD  01/17/2019 2:36 PM

## 2019-01-17 NOTE — H&P (Signed)
History and Physical    DEPRIEST GAHAN [id number] DOB: 16-Nov-1925 DOA: 01/17/2019  Referring MD/NP/PA: Domenic Moras, PA-C PCP: [name], MD  Patient coming from: Via EMS  Chief Complaint: Left hip pain  I have personally briefly reviewed patient's old medical records in [place]   HPI: Frank Elliott is a 85 y.o. male with medical history significant of hypertension, combined CHF 88 to 50% with grade 2 diastolic dysfunction, CAD, persistent atrial fibrillation on Eliquis, heart block s/p pacemaker, and BPH; who presents with complaints of left hip pain.  Patient had been living in independent living up until 8 days ago when he fell getting off his stationary bike.  His right leg reportedly got caught on the seat and he landed on his left side.  He had been unable to get up on his own and called his son who came over and helped him up.  Later on that evening patient had hold of his walker and one hand and a plate in other he fell again.  He said it felt like his left leg was not there at the time.  The following day saw his PCP who did x-rays, but no acute fractures were seen.  Since that time he has been staying with his son and progressively getting weaker.  He has been complaining of pain in the left hip and leg.  Unable to bear weight.  Associated symptoms include progressively worsening bilateral lower extremity edema, swelling in his hands, weight 162 on last check (baseline 155), and wheezing.  Son notes that he is completely wheelchair-bound at this time.  He had been seen by his cardiologist and after repeat x-rays were negative was sent for CT scan of the hip 2 days ago.  However due to patient's worsening condition they came to the hospital before they were able to get the results from the CT scan.  ED Course: On admission into the emergency department patient was noted to be afebrile, pulse 50-65, respirations 1122, blood pressures maintained, and O2 saturations 95-99% on  room air.  Labs revealed elevated MCV and MCH, BUN 48, and creatinine 1.64, total bilirubin 1.6, and BNP 385.7.  CT scan of the left hip revealed a acute impacted subcapital fracture of the left femoral neck.  Dr. Doreatha Martin of orthopedic surgery was consulted.  TRH called to admit   Review of Systems  Constitutional: Positive for malaise/fatigue. Negative for fever.  HENT: Negative for congestion.   Eyes: Negative for double vision and photophobia.  Respiratory: Positive for wheezing. Negative for cough and shortness of breath.   Cardiovascular: Positive for leg swelling. Negative for chest pain.  Gastrointestinal: Negative for abdominal pain, diarrhea, nausea and vomiting.  Genitourinary: Negative for dysuria and hematuria.  Musculoskeletal: Positive for falls, joint pain and myalgias.  Neurological: Positive for weakness. Negative for loss of consciousness.  Psychiatric/Behavioral: Negative for substance abuse.    Past Medical History:  Diagnosis Date  . Arthritis    "just about all over" (02/05/2018)  . AV block, Mobitz 2 02/03/2018   PPM placed 01/2018  . BPH (benign prostatic hyperplasia)   . Chronic combined systolic and diastolic CHF (congestive heart failure) ([place])   . Complication of anesthesia    "had hard time waking up a long time ago" (02/05/2018)  . Cyst of kidney, acquired    "recently found; ? side" (02/05/2018)  . GERD (gastroesophageal reflux disease)   . History of hiatal hernia   . Hypertension   .  Mild CAD    a. 30-40% LAD in 2019.  Marland Kitchen Neuropathy   . NICM (nonischemic cardiomyopathy) ([place])   . Persistent atrial fibrillation   . Pleural effusion    dx 04/01/12  . Skin cancer    "face, nose" (02/05/2018)  . Syncope 02/03/2018    Past Surgical History:  Procedure Laterality Date  . APPENDECTOMY    . BACK SURGERY    . CARDIAC CATHETERIZATION  02/05/2018  . CATARACT EXTRACTION, BILATERAL Bilateral   . CHOLECYSTECTOMY    . JOINT REPLACEMENT    . LEFT HEART  CATH AND CORONARY ANGIOGRAPHY N/A 02/05/2018   Procedure: LEFT HEART CATH AND CORONARY ANGIOGRAPHY;  Surgeon: Belva Crome, MD;  Location: [facility];  Service: Cardiovascular;  Laterality: N/A;  . LUMBAR [place] SURGERY  1980s  . PACEMAKER IMPLANT N/A 02/06/2018   Procedure: PACEMAKER IMPLANT;  Surgeon: Constance Haw, MD;  Location: [facility];  Service: Cardiovascular;  Laterality: N/A;  . SKIN CANCER EXCISION     "face, nose" (02/05/2018)  . TONSILLECTOMY    . TOTAL HIP ARTHROPLASTY Right 1990s/2000s  . ULTRASOUND GUIDANCE FOR VASCULAR ACCESS  02/05/2018   Procedure: Ultrasound Guidance For Vascular Access;  Surgeon: Belva Crome, MD;  Location: [facility];  Service: Cardiovascular;;     reports that he has quit smoking. He has never used smokeless tobacco. He reports previous alcohol use. He reports that he does not use drugs.  Allergies  Allergen Reactions  . Aspirin Anaphylaxis, Hives and Swelling  . Whiskey [Alcohol] Anaphylaxis and Hives  . Amoxicillin Other (See Comments)    Causes sore throat  . Avelox [Moxifloxacin Hcl In Nacl] Other (See Comments)    Caused diarrhea & constipation  . Lisinopril Cough  . Penicillins Other (See Comments)    Unknown reaction  . Sulfa Antibiotics Hives  . Tape Itching    Also causes redness--Paper tape okay  . Zithromax [Azithromycin] Other (See Comments)    Sore throat    Family History  Problem Relation Age of Onset  . Heart disease Father     Prior to Admission medications   Medication Sig Start Date End Date Taking? Authorizing Provider  amiodarone (PACERONE) 200 MG tablet Take 1 tablet (200 mg total) twice a day for one month and then reduce to one tablet daily 12/15/18   Camnitz, Ocie Doyne, MD  betamethasone dipropionate (DIPROLENE) 0.05 % cream Apply 1 application topically 2 (two) times daily as needed. unk 01/22/18   [provider]  carvedilol (COREG) 6.25 MG tablet TAKE 1 TABLET BY MOUTH  TWICE A DAY 07/21/18   Belva Crome, MD  Cyanocobalamin (VITAMIN B-12 PO) Take 1 tablet by mouth daily.    [provider]  ELIQUIS 5 MG TABS tablet TAKE 1 TABLET BY MOUTH TWICE A DAY 12/21/18   Camnitz, Will Hassell Done, MD  fluticasone [facility]) 50 MCG/ACT nasal spray Place 1 spray into both nostrils daily as needed for allergies.  07/27/13   [provider]  furosemide (LASIX) 20 MG tablet Take as directed 1 tablet tues, wed, fri-Sunday 2 tablets on mon & thursdays May have 1 extra tablet daily as needed for swelling 10/26/18   Belva Crome, MD  gabapentin (NEURONTIN) 300 MG capsule Take 300 mg by mouth 3 (three) times daily.    [provider]  losartan (COZAAR) 100 MG tablet Take 0.5 tablets (50 mg total) by mouth daily. 10/13/18 01/13/19  Melina Copa  N, PA-C  omeprazole (PRILOSEC) 20 MG capsule Take 20 mg by mouth 2 (two) times daily.    [provider]  Probiotic Product (ALIGN PO) Take 1 capsule by mouth daily.    [provider]    Physical Exam:  Constitutional: Elderly male who appears to be in no acute distress at this time lying in the hospital gurney. Vitals:   01/17/19 1215 01/17/19 1230 01/17/19 1245 01/17/19 1300  BP: 118/68 133/62 131/71 122/65  Pulse: (!) 50 60 63 (!) 50  Resp: (!) 21 11 (!) 22 15  Temp:      TempSrc:      SpO2: 95% 98% 98% 97%   Eyes: PERRL, lids and conjunctivae normal ENMT: Mucous membranes are moist. Posterior pharynx clear of any exudate or lesions.  Neck: normal, supple, no masses, no thyromegaly Respiratory: Mildly tachypneic with decreased overall aeration.  No wheezes or significant crackles appreciated.  Patient maintaining O2 saturations on room air. Cardiovascular: Bradycardic with positive murmur present.  At least 2+ pitting edema 2+ pedal pulses. No carotid bruits.  Abdomen: no tenderness, no masses palpated. No hepatosplenomegaly. Bowel sounds positive.  Musculoskeletal: Tenderness to palpation of the  left hip with some shortening noted and decreased overall range of motion due to pain.   Skin: Bruising present of the left lateral hip. Neurologic: CN 2-12 grossly intact. Psychiatric: Normal judgment and insight. Alert and oriented x 3. Normal mood.     Labs on Admission: I have personally reviewed following labs and imaging studies  CBC: Recent Labs  Lab 01/17/19 1036  WBC 7.5  NEUTROABS 5.1  HGB 13.6  HCT 40.1  MCV 104.7*  PLT 0000000   Basic Metabolic Panel: Recent Labs  Lab 01/17/19 1036  NA 139  K 5.0  CL 103  CO2 26  GLUCOSE 100*  BUN 48*  CREATININE 1.64*  CALCIUM 9.0   GFR: Estimated Creatinine Clearance: 30.4 mL/min (A) (by C-G formula based on SCr of 1.64 mg/dL (H)). Liver Function Tests: Recent Labs  Lab 01/17/19 1036  AST 32  ALT 31  ALKPHOS 100  BILITOT 1.6*  PROT 6.5  ALBUMIN 3.0*   No results for input(s): LIPASE, AMYLASE in the last 168 hours. No results for input(s): AMMONIA in the last 168 hours. Coagulation Profile: No results for input(s): INR, PROTIME in the last 168 hours. Cardiac Enzymes: No results for input(s): CKTOTAL, CKMB, CKMBINDEX, TROPONINI in the last 168 hours. BNP (last 3 results) Recent Labs    10/08/18 1419  PROBNP 3,397*   HbA1C: No results for input(s): HGBA1C in the last 72 hours. CBG: No results for input(s): GLUCAP in the last 168 hours. Lipid Profile: No results for input(s): CHOL, HDL, LDLCALC, TRIG, CHOLHDL, LDLDIRECT in the last 72 hours. Thyroid Function Tests: No results for input(s): TSH, T4TOTAL, FREET4, T3FREE, THYROIDAB in the last 72 hours. Anemia Panel: No results for input(s): VITAMINB12, FOLATE, FERRITIN, TIBC, IRON, RETICCTPCT in the last 72 hours. Urine analysis:    Component Value Date/Time   COLORURINE AMBER (A) 03/06/2018 0459   APPEARANCEUR HAZY (A) 03/06/2018 0459   LABSPEC >1.046 (H) 03/06/2018 0459   PHURINE 5.0 03/06/2018 0459   GLUCOSEU NEGATIVE 03/06/2018 0459   HGBUR NEGATIVE  03/06/2018 [place] 03/06/2018 [place] 03/06/2018 0459   PROTEINUR NEGATIVE 03/06/2018 0459   UROBILINOGEN 1.0 07/21/2014 2125   NITRITE NEGATIVE 03/06/2018 0459   LEUKOCYTESUR TRACE (A) 03/06/2018 0459   Sepsis Labs: No  results found for this or any previous visit (from the past 240 hour(s)).   Radiological Exams on Admission: Ct Head Wo Contrast  Result Date: 01/17/2019 CLINICAL DATA:  Recurrent falls.  Increasing weakness. EXAM: CT HEAD WITHOUT CONTRAST TECHNIQUE: Contiguous axial images were obtained from the base of the skull through the vertex without intravenous contrast. COMPARISON:  Head CT scan 02/03/2018. FINDINGS: Brain: No evidence of acute infarction, hemorrhage, hydrocephalus, extra-axial collection or mass lesion/mass effect. Remote left basal ganglia lacunar infarction is new since the prior CT. Chronic microvascular ischemic change and cortical atrophy are stable in appearance. Vascular: No hyperdense vessel or unexpected calcification. Skull: Intact.  No focal lesion. Sinuses/Orbits: Negative. Other: None. IMPRESSION: No acute abnormality. No change in atrophy and chronic microvascular ischemic change. Electronically Signed   By: Inge Rise M.D.   On: 01/17/2019 12:08   Ct Lumbar Spine Wo Contrast  Result Date: 01/17/2019 CLINICAL DATA:  Recurrent falls, most recently 01/09/2019. Low back pain. EXAM: CT LUMBAR SPINE WITHOUT CONTRAST TECHNIQUE: Multidetector CT imaging of the lumbar spine was performed without intravenous contrast administration. Multiplanar CT image reconstructions were also generated. COMPARISON:  MRI lumbar spine 05/11/2008. Plain films lumbar spine 01/11/2019. FINDINGS: Segmentation: Standard. Alignment: Maintained with straightening of lordosis noted. Vertebrae: Remote L4 superior endplate compression fracture is unchanged. No acute fracture. Right hip replacement noted. Paraspinal and other soft tissues:  Atherosclerosis and sigmoid diverticulosis are seen. Disc levels: T12-L1: Facet degenerative disease. Vacuum disc phenomenon and a minimal bulge. No stenosis. L1-2: Partial autologous fusion across the disc interspace. Disc bulge, facet arthropathy and ligamentum flavum thickening cause mild central canal narrowing. Foramina appear open. L2-3: Facet arthropathy, ligamentum flavum thickening and a shallow disc bulge. Mild central canal narrowing. Foramina open. L3-4: Status post posterior decompression. There is a shallow disc bulge and endplate spur. Facet arthropathy. There is mild central canal stenosis. Right worse than left subarticular recess narrowing is also present. Moderate bilateral foraminal narrowing is worse on the right. L4-5: Status post decompression. Advanced facet arthropathy. The right facets are ankylosed. The central canal is open. Moderately severe to severe bilateral foraminal narrowing. L5-S1: Status post decompression. The central canal is open. Facet arthropathy is present and contributes to moderate to moderately severe bilateral foraminal narrowing. IMPRESSION: No acute abnormality. Remote L4 compression fracture is unchanged since 2009. No marked change in lumbar spondylosis most notable at L4-5 where there is marked bilateral foraminal narrowing due to disc and facet arthropathy. Please see above for descriptions of individual levels. Electronically Signed   By: Inge Rise M.D.   On: 01/17/2019 12:17   Ct Hip Left Wo Contrast  Result Date: 01/17/2019 CLINICAL DATA:  Left hip pain EXAM: CT OF THE LEFT HIP WITHOUT CONTRAST TECHNIQUE: Multidetector CT imaging of the left hip was performed according to the standard protocol. Multiplanar CT image reconstructions were also generated. COMPARISON:  X-ray 12/31/2018 FINDINGS: Bones/Joint/Cartilage Acute, impacted subcapital fracture of the left femoral neck with approximately 2 cm of foreshortening. No intertrochanteric or  intra-articular fracture component identified. Femoral head alignment is maintained without dislocation. Moderate left hip osteoarthritis. No additional fracture identified. Ligaments Suboptimally assessed by CT. Muscles and Tendons No intramuscular hematoma.  Tendons grossly intact. Soft tissues Mild soft tissue induration over the lateral aspect of the left hip, likely posttraumatic. Other Extensive sigmoid diverticulosis. IMPRESSION: Acute, impacted, subcapital fracture of the left femoral neck. Electronically Signed   By: Davina Poke M.D.   On: 01/17/2019 12:21   Dg Chest Portable  1 View  Result Date: 01/17/2019 CLINICAL DATA:  Pt has been falling AND needing help faster than he can get medical care (per pt's son), with each past fall occurring, his capabilities has decreased, son is concerned about his fathers lack of usual abilities to get up walk. EXAM: PORTABLE CHEST 1 VIEW COMPARISON:  03/29/2018 FINDINGS: Patient has a LEFT-sided transvenous pacemaker with leads to the RIGHT atrium and RIGHT ventricle. The heart is enlarged and stable in configuration. No pulmonary edema. No consolidations or pleural effusions. Chronic changes in both shoulders. IMPRESSION: Stable cardiomegaly. No evidence for acute abnormality. Electronically Signed   By: Nolon Nations M.D.   On: 01/17/2019 11:09    EKG: Independently reviewed.  Atrial fibrillation at 60 bpm.  Assessment/Plan Left femoral neck fracture secondary to fall: Acute.  Patient fell twice approximately 8 days ago.  X-ray imaging in the outpatient setting were negative.  CT imaging of the left hip did show a impacted subcapital femoral neck fracture.  Dr. Doreatha Martin of orthopedic surgery was consulted.    -Admit to a medical telemetry bed  -Hip fracture order set initiated -Pain control -WIill need to medically optimized patient's other medical conditions prior to any surgical procedure -Orthopedic surgery consulted, will follow further  recommendation  Combined systolic and diastolic congestive heart failure exacerbation: Patient complaining of worsening lower extremity swelling and weight being up to 162 pounds when he last check (baseline weight is 155 pounds).  Patient with at least 2+ pitting edema bilaterally on lower extremities.  Chest x-ray revealed cardiomegaly with out overt edema.  He was seen by his cardiologist Dr. [name] last week.  He has been taking his home medications of Lasix.  Last echocardiogram revealed EF of 45 to 50% with grade 2 diastolic dysfunction in [id number].  -Strict intake and output -Daily weight -Lasix 40 mg IV twice daily as tolerated -Obtain repeat echocardiogram if recommended by cardiology -Cardiology formally consulted for help with heart failure, cardiac risk assessment, and optimizing patient for surgery  Acute kidney injury superimposed on chronic kidney disease: Patient's baseline creatinine appears to be around 1.3-1.4.  Patient presents with creatinine elevated up to 1.64 with BUN 48.  Given the elevated BUN to creatinine ratio suggest a prerenal cause of symptoms.  Given patient's significant lower extremity swelling suspect hypoperfusion due to heart failure is the likely cause. -Check urinalysis -Daily monitoring of kidney function during diuresis -Hold nephrotoxic agents  Persistent atrial fibrillation on chronic anticoagulation: Chronic.  Patient appears currently rate controlled and is on Eliquis at home. -Hold Eliquis due to likely need of surgery -Heparin per pharmacy -Continue amiodarone and Coreg  Essential hypertension: Blood pressures currently stable. -Held losartan with acute kidney injury -Continue Coreg and amiodarone  Coronary artery disease: Last heart cath in 02/06/2018 with 30 to 40% narrowing of the LAD noted. -Patient on anticoagulation  Heart block s/p pacemaker: Patient had a [employer] Jude dual chamber pacemaker placed in 02/06/2018 or AV heart block morbid S2.  -Pacemaker to be interrogated  GERD -Continue pharmacy substitution for Prilosec  DVT prophylaxis: Heparin Code Status: Full Family Communication: Discussed plan of care with the patient's family present at bedside. Disposition Plan: To be determined Consults called: Orthopedics and cardiology Admission status: Inpatient  Norval Morton MD [facility] Pager [phone]   If 7PM-7AM, please contact night-coverage [email or web address] Password [email or web address]  01/17/2019, 1:36 PM

## 2019-01-17 NOTE — Progress Notes (Signed)
ANTICOAGULATION CONSULT NOTE - Initial Consult  Pharmacy Consult for heparin Indication: atrial fibrillation  Patient Measurements: Heparin Dosing Weight: 73.5 kg  Vital Signs: Temp: 97.7 F (36.5 C) (09/06 1011) Temp Source: Oral (09/06 1011) BP: 122/65 (09/06 1300) Pulse Rate: 50 (09/06 1300)  Labs: Recent Labs    01/17/19 1036  HGB 13.6  HCT 40.1  PLT 278  CREATININE 1.64*    Estimated Creatinine Clearance: 30.4 mL/min (A) (by C-G formula based on SCr of 1.64 mg/dL (H)).   Medical History: Past Medical History:  Diagnosis Date  . Arthritis    "just about all over" (02/05/2018)  . AV block, Mobitz 2 02/03/2018   PPM placed 01/2018  . BPH (benign prostatic hyperplasia)   . Chronic combined systolic and diastolic CHF (congestive heart failure) ([place])   . Complication of anesthesia    "had hard time waking up a long time ago" (02/05/2018)  . Cyst of kidney, acquired    "recently found; ? side" (02/05/2018)  . GERD (gastroesophageal reflux disease)   . History of hiatal hernia   . Hypertension   . Mild CAD    a. 30-40% LAD in 2019.  Marland Kitchen Neuropathy   . NICM (nonischemic cardiomyopathy) ([place])   . Persistent atrial fibrillation   . Pleural effusion    dx 04/01/12  . Skin cancer    "face, nose" (02/05/2018)  . Syncope 02/03/2018     Assessment: 85 yo male admitted after a fall. He is on Eliquis prior to admission for Afib, last dose was earlier this morning. Planning to transition patient to heparin in anticipation of surgery tomorrow. Current SCr is 1.6, up from baseline 1.1. Patient will need an Eliquis dose reduction at dsicharge if SCr remains elevated. Baseline aPTT/HL ordered.   Goal of Therapy:  APTT 66-102 seconds Heparin level 0.3-0.7 units/ml Monitor platelets by anticoagulation protocol: Yes   Plan:  -Begin heparin this evening at 1050 units/hr -Daily HL, CBC, aPTT -First level with AM labs   Katiria Calame, Jake Church 01/17/2019,2:30 PM

## 2019-01-17 NOTE — Consult Note (Deleted)
Ortho Trauma Note  Reviewed case and imaging with Domenic Moras, PA-C. 85 year old male with displaced femoral neck fracture. The patient will need hemi vs total hip arthroplasty. Dr. Lyla Glassing has graciously accepted to take care of the patient tomorrow. Recommend NPO past midnight for surgery tomorrow. X-rays of left hip and knee ordered.  Shona Needles, MD [facility] [phone] (phone) [phone] (office) [email or web address]

## 2019-01-18 DIAGNOSIS — S72002G Fracture of unspecified part of neck of left femur, subsequent encounter for closed fracture with delayed healing: Secondary | ICD-10-CM

## 2019-01-18 DIAGNOSIS — I4821 Permanent atrial fibrillation: Secondary | ICD-10-CM

## 2019-01-18 LAB — VITAMIN B12: Vitamin B-12: 1493 pg/mL — ABNORMAL HIGH (ref 180–914)

## 2019-01-18 LAB — HEPARIN LEVEL (UNFRACTIONATED)
Heparin Unfractionated: >2.2 IU/mL — ABNORMAL HIGH (ref 0.30–0.70)
Heparin Unfractionated: >2.2 IU/mL — ABNORMAL HIGH (ref 0.30–0.70)

## 2019-01-18 LAB — CBC
HCT: 37.7 % — ABNORMAL LOW (ref 39.0–52.0)
Hemoglobin: 12.7 g/dL — ABNORMAL LOW (ref 13.0–17.0)
MCH: 34 pg (ref 26.0–34.0)
MCHC: 33.7 g/dL (ref 30.0–36.0)
MCV: 100.8 fL — ABNORMAL HIGH (ref 80.0–100.0)
Platelets: 279 10*3/uL (ref 150–400)
RBC: 3.74 MIL/uL — ABNORMAL LOW (ref 4.22–5.81)
RDW: 13.4 % (ref 11.5–15.5)
WBC: 6.7 10*3/uL (ref 4.0–10.5)
nRBC: 0 % (ref 0.0–0.2)

## 2019-01-18 LAB — BASIC METABOLIC PANEL
Anion gap: 12 (ref 5–15)
BUN: 45 mg/dL — ABNORMAL HIGH (ref 8–23)
CO2: 26 mmol/L (ref 22–32)
Calcium: 8.4 mg/dL — ABNORMAL LOW (ref 8.9–10.3)
Chloride: 99 mmol/L (ref 98–111)
Creatinine, Ser: 1.73 mg/dL — ABNORMAL HIGH (ref 0.61–1.24)
GFR calc Af Amer: 39 mL/min — ABNORMAL LOW (ref >60)
GFR calc non Af Amer: 34 mL/min — ABNORMAL LOW (ref >60)
Glucose, Bld: 111 mg/dL — ABNORMAL HIGH (ref 70–99)
Potassium: 4.4 mmol/L (ref 3.5–5.1)
Sodium: 137 mmol/L (ref 135–145)

## 2019-01-18 LAB — APTT
aPTT: 170 seconds (ref 24–36)
aPTT: 124 seconds — ABNORMAL HIGH (ref 24–36)

## 2019-01-18 LAB — TSH: TSH: 0.989 u[IU]/mL (ref 0.350–4.500)

## 2019-01-18 LAB — SURGICAL PCR SCREEN
MRSA, PCR: NEGATIVE
Staphylococcus aureus: NEGATIVE

## 2019-01-18 MED ORDER — GABAPENTIN 300 MG PO CAPS
300.0000 mg | ORAL_CAPSULE | Freq: Three times a day (TID) | ORAL | Status: DC
  Administered 2019-01-18 – 2019-01-20 (×8): 300 mg via ORAL
  Filled 2019-01-18 (×8): qty 1

## 2019-01-18 MED ORDER — SENNOSIDES-DOCUSATE SODIUM 8.6-50 MG PO TABS
2.0000 | ORAL_TABLET | Freq: Every evening | ORAL | Status: DC | PRN
  Filled 2019-01-18: qty 2

## 2019-01-18 MED ORDER — HYDRALAZINE HCL 20 MG/ML IJ SOLN: 10.0000 mg | INTRAMUSCULAR | Status: DC | PRN

## 2019-01-18 MED ORDER — POLYETHYLENE GLYCOL 3350 17 G PO PACK
17.0000 g | PACK | Freq: Every day | ORAL | Status: DC | PRN
  Administered 2019-01-19 – 2019-01-20 (×2): 17 g via ORAL
  Filled 2019-01-18 (×3): qty 1

## 2019-01-18 MED ORDER — PANTOPRAZOLE SODIUM 40 MG PO TBEC
40.0000 mg | DELAYED_RELEASE_TABLET | Freq: Every day | ORAL | Status: DC
  Administered 2019-01-18 – 2019-01-28 (×9): 40 mg via ORAL
  Filled 2019-01-18 (×9): qty 1

## 2019-01-18 MED ORDER — CLINDAMYCIN PHOSPHATE 600 MG/50ML IV SOLN: 600.0000 mg | INTRAVENOUS | Status: DC

## 2019-01-18 MED ORDER — TRAMADOL HCL 50 MG PO TABS
50.0000 mg | ORAL_TABLET | Freq: Four times a day (QID) | ORAL | Status: DC | PRN
  Administered 2019-01-18 – 2019-01-28 (×10): 50 mg via ORAL
  Filled 2019-01-18 (×10): qty 1

## 2019-01-18 MED ORDER — IPRATROPIUM-ALBUTEROL 0.5-2.5 (3) MG/3ML IN SOLN: 3.0000 mL | RESPIRATORY_TRACT | Status: DC | PRN

## 2019-01-18 MED ORDER — AMIODARONE HCL 200 MG PO TABS
200.0000 mg | ORAL_TABLET | Freq: Every day | ORAL | Status: DC
  Administered 2019-01-18 – 2019-01-21 (×3): 200 mg via ORAL
  Filled 2019-01-18 (×3): qty 1

## 2019-01-18 MED ORDER — CARVEDILOL 6.25 MG PO TABS
6.2500 mg | ORAL_TABLET | Freq: Two times a day (BID) | ORAL | Status: DC
  Administered 2019-01-18 – 2019-01-19 (×3): 6.25 mg via ORAL
  Filled 2019-01-18 (×2): qty 1

## 2019-01-18 MED ORDER — HEPARIN (PORCINE) 25000 UT/250ML-% IV SOLN
650.0000 [IU]/h | INTRAVENOUS | Status: AC
  Administered 2019-01-18 (×2): 800 [IU]/h via INTRAVENOUS
  Filled 2019-01-18: qty 250

## 2019-01-18 MED ORDER — CLINDAMYCIN PHOSPHATE 600 MG/50ML IV SOLN
600.0000 mg | Freq: Once | INTRAVENOUS | Status: AC
  Administered 2019-01-19: 06:00:00 600 mg via INTRAVENOUS
  Filled 2019-01-18: qty 50

## 2019-01-18 MED ORDER — CLINDAMYCIN PHOSPHATE 900 MG/50ML IV SOLN: 900.0000 mg | INTRAVENOUS | Status: DC

## 2019-01-18 NOTE — Plan of Care (Signed)
  Problem: Education: Goal: Knowledge of General Education information will improve Description Including pain rating scale, medication(s)/side effects and non-pharmacologic comfort measures Outcome: Progressing   Problem: Clinical Measurements: Goal: Ability to maintain clinical measurements within normal limits will improve Outcome: Progressing   Problem: Clinical Measurements: Goal: Will remain free from infection Outcome: Progressing   
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
## 2019-01-18 NOTE — Progress Notes (Signed)
Patient scheduled for surgical fixation of left hip tomorrow morning at 8AM with Dr. Marcelino Scot. Please make sure Heparin drip is stopped at 4AM. Thank you   Dyan Creelman A. [facility] ?[phone]? (phone)

## 2019-01-18 NOTE — Progress Notes (Signed)
ANTICOAGULATION CONSULT NOTE - Follow Up Consult  Pharmacy Consult for Heparin Indication: atrial fibrillation (Eliquis on hold)  Allergies  Allergen Reactions  . Aspirin Anaphylaxis, Hives and Swelling  . Whiskey [Alcohol] Anaphylaxis and Hives  . Amoxicillin Other (See Comments)    Causes sore throat  . Avelox [Moxifloxacin Hcl In Nacl] Diarrhea and Other (See Comments)    Constipation, also  . Lisinopril Cough  . Sulfa Antibiotics Hives  . Tape Itching    Also causes redness--Paper tape okay  . Zithromax [Azithromycin] Other (See Comments)    Sore throat  . Penicillins Rash    Did it involve swelling of the face/tongue/throat, SOB, or low BP? Yes Did it involve sudden or severe rash/hives, skin peeling, or any reaction on the inside of your mouth or nose? Unk Did you need to seek medical attention at a hospital or doctor's office? Unk When did it last happen?Years ago If all above answers are "NO", may proceed with cephalosporin use.     Patient Measurements: Height: 5\' 9"  (175.3 cm) Weight: 160 lb 15 oz (73 kg) IBW/kg (Calculated) : 70.7 Heparin Dosing Weight: 73 kg  Vital Signs: Temp: 99 F (37.2 C) (09/07 1654) Temp Source: Oral (09/07 1654) BP: 107/54 (09/07 1654) Pulse Rate: 59 (09/07 1654)  Labs: Recent Labs    01/17/19 1036 01/17/19 1757 01/18/19 0434 01/18/19 1609  HGB 13.6  --  12.7*  --   HCT 40.1  --  37.7*  --   PLT 278  --  279  --   APTT  --  46* 170* 124*  HEPARINUNFRC  --  >2.20* >2.20*  --   CREATININE 1.64*  --  1.73*  --     Estimated Creatinine Clearance: 28.4 mL/min (A) (by C-G formula based on SCr of 1.73 mg/dL (H)).   Medications:  Scheduled:  . amiodarone  200 mg Oral Daily  . carvedilol  6.25 mg Oral BID WC  . furosemide  40 mg Intravenous BID  . gabapentin  300 mg Oral TID  . pantoprazole  40 mg Oral Daily  . povidone-iodine  2 application Topical Once  . povidone-iodine  2 application Topical Once   Infusions:  .  [START ON 01/19/2019] clindamycin (CLEOCIN) IV    . heparin 800 Units/hr (01/18/19 1615)  . tranexamic acid      Assessment: 85 yo M on Apixaban PTA for hx of afib.  Pt has been transitioned to IV heparin in anticipation of orthopeadic surgery on /8.  Apixaban can elevate the anti-Xa level and therefore heparin is being monitored using aPTTs.  The aPTT is elevated on heparin at 800 units/hr.  No bleeding noted per discussion with RN.  Noted plans for OR in AM and order to stop heparin infusion at 0400.  Goal of Therapy:  Heparin level 0.3-0.7 units/ml aPTT 66-102 seconds Monitor platelets by anticoagulation protocol: Yes   Plan:  Decrease heparin infusion to 650 units/hr. Stop heparin infusion at 0400. No further labs needed at this time. Follow-up anticoag plans after OR 9/8.  [employer], Pharm.D., BCPS Clinical Pharmacist  **Pharmacist phone directory can now be found on [email or web address] ([email or web address]).  Listed under [place].  01/18/2019 5:03 PM

## 2019-01-18 NOTE — Plan of Care (Signed)
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
## 2019-01-18 NOTE — Progress Notes (Signed)
ANTICOAGULATION CONSULT NOTE - Follow Up Consult  Pharmacy Consult for heparin Indication: atrial fibrillation   Labs: Recent Labs    01/17/19 1036 01/17/19 1757 01/18/19 0434  HGB 13.6  --  12.7*  HCT 40.1  --  37.7*  PLT 278  --  279  APTT  --  46* 170*  HEPARINUNFRC  --  >2.20* >2.20*  CREATININE 1.64*  --  1.73*     Assessment: 85yo male supratherapeutic on heparin with initial dosing while Eliquis on hold; no gtt issues or signs of bleeding per RN; attempted to verify that lab was drawn from arm opposite from heparin infusion and there was a puncture mark on the opposite hand but no bandage.  Goal of Therapy:  Heparin level 0.3-0.7 units/ml   Plan:  Will hold heparin gtt x21min then decrease heparin gtt by 3 units/kg/hr to 800 units/hr and check level in 8 hours.    Wynona Neat, PharmD, BCPS  01/18/2019,7:53 AM

## 2019-01-18 NOTE — Social Work (Signed)
CSW acknowledging consult for SNF placement. Will follow for post operative therapy recommendations needed to best determine disposition/for insurance authorization.   Westley Hummer, MSW, [place] Work [phone]

## 2019-01-18 NOTE — Progress Notes (Signed)
Noted PTT continues to be at 124. Called pharmacy to report new lab values. No s/sx of acute distress or bleeding at this time. Continue to monitor.

## 2019-01-19 ENCOUNTER — Encounter (HOSPITAL_COMMUNITY): Payer: Self-pay | Admitting: Anesthesiology

## 2019-01-19 ENCOUNTER — Inpatient Hospital Stay (HOSPITAL_COMMUNITY): Payer: Medicare Other

## 2019-01-19 ENCOUNTER — Encounter (HOSPITAL_COMMUNITY)
Admission: EM | Disposition: A | Discharge status: Rehabilitation Facility | Payer: Self-pay | Source: Home / Self Care | Attending: Internal Medicine

## 2019-01-19 ENCOUNTER — Inpatient Hospital Stay (HOSPITAL_COMMUNITY): Payer: Medicare Other | Admitting: Anesthesiology

## 2019-01-19 ENCOUNTER — Other Ambulatory Visit: Payer: Self-pay | Admitting: Interventional Cardiology

## 2019-01-19 HISTORY — PX: HIP ARTHROPLASTY: SHX981

## 2019-01-19 LAB — CBC
HCT: 36 % — ABNORMAL LOW (ref 39.0–52.0)
Hemoglobin: 12.8 g/dL — ABNORMAL LOW (ref 13.0–17.0)
MCH: 36.6 pg — ABNORMAL HIGH (ref 26.0–34.0)
MCHC: 35.6 g/dL (ref 30.0–36.0)
MCV: 102.9 fL — ABNORMAL HIGH (ref 80.0–100.0)
Platelets: 301 10*3/uL (ref 150–400)
RBC: 3.5 MIL/uL — ABNORMAL LOW (ref 4.22–5.81)
RDW: 13.8 % (ref 11.5–15.5)
WBC: 7 10*3/uL (ref 4.0–10.5)
nRBC: 0 % (ref 0.0–0.2)

## 2019-01-19 LAB — MAGNESIUM: Magnesium: 2.1 mg/dL (ref 1.7–2.4)

## 2019-01-19 LAB — BASIC METABOLIC PANEL
Anion gap: 9 (ref 5–15)
BUN: 44 mg/dL — ABNORMAL HIGH (ref 8–23)
CO2: 29 mmol/L (ref 22–32)
Calcium: 8.5 mg/dL — ABNORMAL LOW (ref 8.9–10.3)
Chloride: 98 mmol/L (ref 98–111)
Creatinine, Ser: 1.84 mg/dL — ABNORMAL HIGH (ref 0.61–1.24)
GFR calc Af Amer: 37 mL/min — ABNORMAL LOW (ref >60)
GFR calc non Af Amer: 32 mL/min — ABNORMAL LOW (ref >60)
Glucose, Bld: 119 mg/dL — ABNORMAL HIGH (ref 70–99)
Potassium: 4.1 mmol/L (ref 3.5–5.1)
Sodium: 136 mmol/L (ref 135–145)

## 2019-01-19 LAB — VITAMIN D 25 HYDROXY (VIT D DEFICIENCY, FRACTURES): Vit D, 25-Hydroxy: 9.6 ng/mL — ABNORMAL LOW (ref 30.0–100.0)

## 2019-01-19 LAB — HEPARIN LEVEL (UNFRACTIONATED)
Heparin Unfractionated: >2.2 IU/mL — ABNORMAL HIGH (ref 0.30–0.70)
Heparin Unfractionated: >2.2 IU/mL — ABNORMAL HIGH (ref 0.30–0.70)

## 2019-01-19 LAB — FOLATE RBC
Folate, Hemolysate: 438 ng/mL
Folate, RBC: 1174 ng/mL (ref >498)
Hematocrit: 37.3 % — ABNORMAL LOW (ref 37.5–51.0)

## 2019-01-19 LAB — APTT: aPTT: 55 seconds — ABNORMAL HIGH (ref 24–36)

## 2019-01-19 SURGERY — HEMIARTHROPLASTY, HIP, DIRECT ANTERIOR APPROACH, FOR FRACTURE
Anesthesia: General | Site: Hip | Laterality: Left

## 2019-01-19 MED ORDER — ONDANSETRON HCL 4 MG PO TABS: 4.0000 mg | ORAL_TABLET | Freq: Four times a day (QID) | ORAL | Status: DC | PRN

## 2019-01-19 MED ORDER — PHENYLEPHRINE HCL (PRESSORS) 10 MG/ML IV SOLN
INTRAVENOUS | Status: DC | PRN
  Administered 2019-01-19: 80 ug via INTRAVENOUS

## 2019-01-19 MED ORDER — PROPOFOL 10 MG/ML IV BOLUS
INTRAVENOUS | Status: DC | PRN
  Administered 2019-01-19: 100 mg via INTRAVENOUS

## 2019-01-19 MED ORDER — METOCLOPRAMIDE HCL 5 MG/ML IJ SOLN: 5.0000 mg | Freq: Three times a day (TID) | INTRAMUSCULAR | Status: DC | PRN

## 2019-01-19 MED ORDER — SUGAMMADEX SODIUM 200 MG/2ML IV SOLN
INTRAVENOUS | Status: DC | PRN
  Administered 2019-01-19: 200 mg via INTRAVENOUS

## 2019-01-19 MED ORDER — HEPARIN (PORCINE) 25000 UT/250ML-% IV SOLN
750.0000 [IU]/h | INTRAVENOUS | Status: AC
  Administered 2019-01-19: 650 [IU]/h via INTRAVENOUS

## 2019-01-19 MED ORDER — 0.9 % SODIUM CHLORIDE (POUR BTL) OPTIME
TOPICAL | Status: DC | PRN
  Administered 2019-01-19: 1000 mL

## 2019-01-19 MED ORDER — ONDANSETRON HCL 4 MG/2ML IJ SOLN: 4.0000 mg | Freq: Four times a day (QID) | INTRAMUSCULAR | Status: DC | PRN

## 2019-01-19 MED ORDER — PROMETHAZINE HCL 25 MG/ML IJ SOLN: 6.2500 mg | INTRAMUSCULAR | Status: DC | PRN

## 2019-01-19 MED ORDER — FENTANYL CITRATE (PF) 250 MCG/5ML IJ SOLN
INTRAMUSCULAR | Status: AC
  Filled 2019-01-19: qty 5

## 2019-01-19 MED ORDER — OXYCODONE HCL 5 MG PO TABS: 5.0000 mg | ORAL_TABLET | Freq: Once | ORAL | Status: DC | PRN

## 2019-01-19 MED ORDER — DEXAMETHASONE SODIUM PHOSPHATE 10 MG/ML IJ SOLN
INTRAMUSCULAR | Status: DC | PRN
  Administered 2019-01-19: 5 mg via INTRAVENOUS

## 2019-01-19 MED ORDER — HYDROMORPHONE HCL 1 MG/ML IJ SOLN: 0.2500 mg | INTRAMUSCULAR | Status: DC | PRN

## 2019-01-19 MED ORDER — PROPOFOL 10 MG/ML IV BOLUS
INTRAVENOUS | Status: AC
  Filled 2019-01-19: qty 20

## 2019-01-19 MED ORDER — SODIUM CHLORIDE 0.9 % IV SOLN
INTRAVENOUS | Status: DC | PRN
  Administered 2019-01-19: 09:00:00 50 ug/min via INTRAVENOUS

## 2019-01-19 MED ORDER — ONDANSETRON HCL 4 MG/2ML IJ SOLN
INTRAMUSCULAR | Status: DC | PRN
  Administered 2019-01-19: 4 mg via INTRAVENOUS

## 2019-01-19 MED ORDER — OXYCODONE HCL 5 MG/5ML PO SOLN: 5.0000 mg | Freq: Once | ORAL | Status: DC | PRN

## 2019-01-19 MED ORDER — HEPARIN (PORCINE) 25000 UT/250ML-% IV SOLN
650.0000 [IU]/h | INTRAVENOUS | Status: DC
  Administered 2019-01-19: 12:00:00 650 [IU]/h via INTRAVENOUS

## 2019-01-19 MED ORDER — LACTATED RINGERS IV SOLN
INTRAVENOUS | Status: DC | PRN
  Administered 2019-01-19 (×2): via INTRAVENOUS

## 2019-01-19 MED ORDER — DOCUSATE SODIUM 100 MG PO CAPS
100.0000 mg | ORAL_CAPSULE | Freq: Two times a day (BID) | ORAL | Status: DC
  Administered 2019-01-19 – 2019-01-28 (×12): 100 mg via ORAL
  Filled 2019-01-19 (×15): qty 1

## 2019-01-19 MED ORDER — LIDOCAINE 2% (20 MG/ML) 5 ML SYRINGE
INTRAMUSCULAR | Status: DC | PRN
  Administered 2019-01-19: 60 mg via INTRAVENOUS

## 2019-01-19 MED ORDER — FENTANYL CITRATE (PF) 100 MCG/2ML IJ SOLN
INTRAMUSCULAR | Status: DC | PRN
  Administered 2019-01-19: 100 ug via INTRAVENOUS
  Administered 2019-01-19: 25 ug via INTRAVENOUS

## 2019-01-19 MED ORDER — CLINDAMYCIN PHOSPHATE 600 MG/50ML IV SOLN
600.0000 mg | Freq: Four times a day (QID) | INTRAVENOUS | Status: AC
  Administered 2019-01-19 (×2): 600 mg via INTRAVENOUS
  Filled 2019-01-19 (×2): qty 50

## 2019-01-19 MED ORDER — ROCURONIUM BROMIDE 50 MG/5ML IV SOSY
PREFILLED_SYRINGE | INTRAVENOUS | Status: DC | PRN
  Administered 2019-01-19: 50 mg via INTRAVENOUS
  Administered 2019-01-19: 10 mg via INTRAVENOUS

## 2019-01-19 MED ORDER — METOCLOPRAMIDE HCL 5 MG PO TABS
5.0000 mg | ORAL_TABLET | Freq: Three times a day (TID) | ORAL | Status: DC | PRN
  Filled 2019-01-19: qty 2

## 2019-01-19 SURGICAL SUPPLY — 58 items
BALL HIP DEPUY 50 (Hips) IMPLANT
BLADE SAW SAG 73X25 THK (BLADE) ×1
BLADE SAW SGTL 73X25 THK (BLADE) ×2 IMPLANT
BRUSH FEMORAL CANAL (MISCELLANEOUS) IMPLANT
BRUSH SCRUB EZ PLAIN DRY (MISCELLANEOUS) ×6 IMPLANT
COVER SURGICAL LIGHT HANDLE (MISCELLANEOUS) ×3 IMPLANT
COVER WAND RF STERILE (DRAPES) ×3 IMPLANT
DRAPE INCISE IOBAN 85X60 (DRAPES) ×3 IMPLANT
DRAPE ORTHO SPLIT 77X108 STRL (DRAPES) ×4
DRAPE SURG ORHT 6 SPLT 77X108 (DRAPES) ×2 IMPLANT
DRAPE U-SHAPE 47X51 STRL (DRAPES) ×3 IMPLANT
DRSG MEPILEX BORDER 4X8 (GAUZE/BANDAGES/DRESSINGS) ×3 IMPLANT
ELECT BLADE 6.5 EXT (BLADE) IMPLANT
ELECT CAUTERY BLADE 6.4 (BLADE) IMPLANT
ELECT REM PT RETURN 9FT ADLT (ELECTROSURGICAL) ×3
ELECTRODE REM PT RTRN 9FT ADLT (ELECTROSURGICAL) ×1 IMPLANT
GLOVE BIO SURGEON STRL SZ7.5 (GLOVE) ×3 IMPLANT
GLOVE BIO SURGEON STRL SZ8 (GLOVE) ×3 IMPLANT
GLOVE BIOGEL PI IND STRL 8 (GLOVE) ×2 IMPLANT
GLOVE BIOGEL PI INDICATOR 8 (GLOVE) ×4
GOWN STRL REUS W/ TWL LRG LVL3 (GOWN DISPOSABLE) ×2 IMPLANT
GOWN STRL REUS W/ TWL XL LVL3 (GOWN DISPOSABLE) ×1 IMPLANT
GOWN STRL REUS W/TWL 2XL LVL3 (GOWN DISPOSABLE) IMPLANT
GOWN STRL REUS W/TWL LRG LVL3 (GOWN DISPOSABLE) ×4
GOWN STRL REUS W/TWL XL LVL3 (GOWN DISPOSABLE) ×2
HANDPIECE INTERPULSE COAX TIP (DISPOSABLE)
HIP BALL DEPUY 50 (Hips) ×3 IMPLANT
KIT BASIN OR (CUSTOM PROCEDURE TRAY) ×3 IMPLANT
KIT TURNOVER KIT B (KITS) ×3 IMPLANT
MANIFOLD NEPTUNE II (INSTRUMENTS) ×3 IMPLANT
NDL 1/2 CIR MAYO (NEEDLE) IMPLANT
NEEDLE 1/2 CIR MAYO (NEEDLE) IMPLANT
NS IRRIG 1000ML POUR BTL (IV SOLUTION) ×3 IMPLANT
PACK TOTAL JOINT (CUSTOM PROCEDURE TRAY) ×3 IMPLANT
PAD ARMBOARD 7.5X6 YLW CONV (MISCELLANEOUS) ×6 IMPLANT
PILLOW ABDUCTION MEDIUM (MISCELLANEOUS) IMPLANT
PRESSURIZER FEMORAL UNIV (MISCELLANEOUS) IMPLANT
RETRIEVER SUT HEWSON (MISCELLANEOUS) ×3 IMPLANT
SET HNDPC FAN SPRY TIP SCT (DISPOSABLE) IMPLANT
SPACER FEM TAPERED +0 12/14 (Hips) ×2 IMPLANT
STAPLER VISISTAT 35W (STAPLE) ×3 IMPLANT
STEM SUMMIT PRESSFIT HIP SZ7 (Hips) ×2 IMPLANT
SUT ETHILON 2 0 PSLX (SUTURE) ×6 IMPLANT
SUT FIBERWIRE #2 38 T-5 BLUE (SUTURE) ×6
SUT VIC AB 1 CT1 18XCR BRD 8 (SUTURE) ×1 IMPLANT
SUT VIC AB 1 CT1 27 (SUTURE) ×2
SUT VIC AB 1 CT1 27XBRD ANBCTR (SUTURE) ×1 IMPLANT
SUT VIC AB 1 CT1 8-18 (SUTURE) ×2
SUT VIC AB 2-0 CT1 27 (SUTURE) ×4
SUT VIC AB 2-0 CT1 TAPERPNT 27 (SUTURE) ×2 IMPLANT
SUTURE FIBERWR #2 38 T-5 BLUE (SUTURE) ×2 IMPLANT
TOWEL GREEN STERILE (TOWEL DISPOSABLE) ×3 IMPLANT
TOWEL GREEN STERILE FF (TOWEL DISPOSABLE) ×3 IMPLANT
TOWER CARTRIDGE SMART MIX (DISPOSABLE) IMPLANT
TUBE CONNECTING 12'X1/4 (SUCTIONS) ×1
TUBE CONNECTING 12X1/4 (SUCTIONS) ×1 IMPLANT
WATER STERILE IRR 1000ML POUR (IV SOLUTION) ×3 IMPLANT
YANKAUER SUCT BULB TIP NO VENT (SUCTIONS) ×2 IMPLANT

## 2019-01-19 NOTE — Progress Notes (Signed)
Dr. Sabra Heck notified of patient's pacemaker. No need to call device representative per Dr. Sabra Heck.

## 2019-01-19 NOTE — Anesthesia Preprocedure Evaluation (Addendum)
Anesthesia Evaluation  Patient identified by MRN, date of birth, ID band Patient awake    Reviewed: Allergy & Precautions, NPO status , Patient's Chart, lab work & pertinent test results  Airway Mallampati: II  TM Distance: <3 FB Neck ROM: full  Mouth opening: Limited Mouth Opening  Dental no notable dental hx. (+) Poor Dentition, Dental Advidsory Given   Pulmonary neg pulmonary ROS, COPD, former smoker,    Pulmonary exam normal breath sounds clear to auscultation       Cardiovascular hypertension, + CAD and +CHF  Normal cardiovascular exam+ dysrhythmias Atrial Fibrillation + pacemaker  Rhythm:Regular Rate:Normal     Neuro/Psych negative neurological ROS  negative psych ROS   GI/Hepatic negative GI ROS, Neg liver ROS, hiatal hernia, GERD  Medicated and Controlled,  Endo/Other  negative endocrine ROS  Renal/GU Renal InsufficiencyRenal diseasenegative Renal ROS  negative genitourinary   Musculoskeletal negative musculoskeletal ROS (+) Arthritis , Osteoarthritis,    Abdominal   Peds negative pediatric ROS (+)  Hematology negative hematology ROS (+)   Anesthesia Other Findings History of chronic combined systolic and diastolic heart failure (EF 45-50 %), nonobstructive CAD, second-degree AV block status post permanent pacemaker 01/2018  Reproductive/Obstetrics negative OB ROS                           Anesthesia Physical Anesthesia Plan  ASA: III  Anesthesia Plan: General   Post-op Pain Management:    Induction: Intravenous  PONV Risk Score and Plan: 2 and Ondansetron, Midazolam and Treatment may vary due to age or medical condition  Airway Management Planned: Oral ETT  Additional Equipment:   Intra-op Plan:   Post-operative Plan: Extubation in OR  Informed Consent: I have reviewed the patients History and Physical, chart, labs and discussed the procedure including the risks,  benefits and alternatives for the proposed anesthesia with the patient or authorized representative who has indicated his/her understanding and acceptance.     Dental Advisory Given  Plan Discussed with: CRNA  Anesthesia Plan Comments:        Anesthesia Quick Evaluation

## 2019-01-19 NOTE — Progress Notes (Signed)
I have seen and examined the patient. I agree with PA Irven Baltimore findings above.   Displaced left femoral neck fracture  The risks and benefits of surgery were discussed with the patient's son, including the possibility of infection, nerve injury, vessel injury, wound breakdown, arthritis, symptomatic hardware, instability, DVT/ PE, limb length discrepancy, loss of motion, and need for further surgery among others.  He acknowledged these risks and provided consent to proceed.    Rozanna Box, MD 01/19/2019 8:10 AM
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
## 2019-01-19 NOTE — Progress Notes (Signed)
ANTICOAGULATION CONSULT NOTE - Follow Up Consult  Pharmacy Consult for Heparin Indication: atrial fibrillation (Eliquis on hold)  Allergies  Allergen Reactions  . Aspirin Anaphylaxis, Hives and Swelling  . Whiskey [Alcohol] Anaphylaxis and Hives  . Amoxicillin Other (See Comments)    Causes sore throat  . Avelox [Moxifloxacin Hcl In Nacl] Diarrhea and Other (See Comments)    Constipation, also  . Lisinopril Cough  . Sulfa Antibiotics Hives  . Tape Itching    Also causes redness--Paper tape okay  . Zithromax [Azithromycin] Other (See Comments)    Sore throat  . Penicillins Rash    Did it involve swelling of the face/tongue/throat, SOB, or low BP? Yes Did it involve sudden or severe rash/hives, skin peeling, or any reaction on the inside of your mouth or nose? Unk Did you need to seek medical attention at a hospital or doctor's office? Unk When did it last happen?Years ago If all above answers are "NO", may proceed with cephalosporin use.     Patient Measurements: Height: 5\' 9"  (175.3 cm) Weight: 160 lb 11.5 oz (72.9 kg) IBW/kg (Calculated) : 70.7 Heparin Dosing Weight: 73 kg  Vital Signs: Temp: 98.3 F (36.8 C) (09/08 1603) Temp Source: Oral (09/08 1603) BP: 135/59 (09/08 1603) Pulse Rate: 59 (09/08 1603)  Labs: Recent Labs    01/17/19 1036  01/17/19 1757 01/18/19 0434 01/18/19 0824 01/18/19 1609 01/19/19 0355 01/19/19 1811  HGB 13.6  --   --  12.7*  --   --  12.8*  --   HCT 40.1  --   --  37.7* 37.3*  --  36.0*  --   PLT 278  --   --  279  --   --  301  --   APTT  --   --  46* 170*  --  124*  --   --   HEPARINUNFRC  --    < > >2.20* >2.20*  --  >2.20*  --  >2.20*  CREATININE 1.64*  --   --  1.73*  --   --  1.84*  --    < > = values in this interval not displayed.    Estimated Creatinine Clearance: 26.7 mL/min (A) (by C-G formula based on SCr of 1.84 mg/dL (H)).   Medications:  Scheduled:  . amiodarone  200 mg Oral Daily  . carvedilol  6.25 mg Oral  BID WC  . docusate sodium  100 mg Oral BID  . gabapentin  300 mg Oral TID  . pantoprazole  40 mg Oral Daily   Infusions:  . clindamycin (CLEOCIN) IV 600 mg (01/19/19 1428)  . heparin 650 Units/hr (01/19/19 1421)    Assessment: 85 yo M on Apixaban PTA for hx of afib, last dose 9/6. Pt has been transitioned to IV heparin in anticipation of orthopedic surgery on 9/8. Heparin was stopped at 0400 on 9/8 pre-op, and resumed at 1220 on 9/8 post-op at 650 units/hr. Heparin level was drawn at 1811, less than 4 hours from start of infusion, and supratherapeutic >2.20. No aPTT was ordered or drawn. No bleeding noted per RN.  Goal of Therapy:  Heparin level 0.3-0.7 units/ml aPTT 66-102 seconds Monitor platelets by anticoagulation protocol: Yes   Plan:  Re-order STAT heparin level and aPTT  Berenice Bouton, PharmD PGY1 Pharmacy Resident Office phone: [phone] Phone until 9 pm: [phone]  **Pharmacist phone directory can now be found on [email or web address] ([email or web address]).  Listed under [place].  01/19/2019 7:31 PM

## 2019-01-19 NOTE — Transfer of Care (Signed)
Immediate Anesthesia Transfer of Care Note  Patient: Frank Elliott  Procedure(s) Performed: ARTHROPLASTY BIPOLAR HIP (HEMIARTHROPLASTY) (Left Hip)  Patient Location: PACU  Anesthesia Type:General  Level of Consciousness: sedated  Airway & Oxygen Therapy: Patient Spontanous Breathing and Patient connected to nasal cannula oxygen  Post-op Assessment: Report given to RN, Post -op Vital signs reviewed and stable and Patient moving all extremities X 4  Post vital signs: Reviewed and stable  Last Vitals:  Vitals Value Taken Time  BP 114/82 01/19/19 1020  Temp    Pulse 60 01/19/19 1021  Resp 12 01/19/19 1021  SpO2 98 % 01/19/19 1021  Vitals shown include unvalidated device data.  Last Pain:  Vitals:   01/19/19 0504  TempSrc:   PainSc: Asleep      Patients Stated Pain Goal: 0 ([phone])  Complications: No apparent anesthesia complications

## 2019-01-19 NOTE — Progress Notes (Signed)
Lunch relief by MA Lela Gell RN 

## 2019-01-19 NOTE — Progress Notes (Signed)
Patient to OR at 47.

## 2019-01-19 NOTE — Anesthesia Postprocedure Evaluation (Signed)
Anesthesia Post Note  Patient: Jaiden J Korb  Procedure(s) Performed: ARTHROPLASTY BIPOLAR HIP (HEMIARTHROPLASTY) (Left Hip)     Patient location during evaluation: PACU Anesthesia Type: General Level of consciousness: awake and alert Pain management: pain level controlled Vital Signs Assessment: post-procedure vital signs reviewed and stable Respiratory status: spontaneous breathing, nonlabored ventilation and respiratory function stable Cardiovascular status: blood pressure returned to baseline and stable Postop Assessment: no apparent nausea or vomiting Anesthetic complications: no    Last Vitals:  Vitals:   01/19/19 1130 01/19/19 1136  BP:  (!) 157/68  Pulse: 60 60  Resp: 15 16  Temp:    SpO2: 100% 94%    Last Pain:  Vitals:   01/19/19 1130  TempSrc:   PainSc: 0-No pain                 Lynda Rainwater

## 2019-01-19 NOTE — Progress Notes (Signed)
Patient called out to staff requesting Miralax for a bowel movement at 0330 am however patient is unable to take Miralax at this time because he is nothing by mouth since 12 midnight last night.  He can have meds with little sips of water.  This was explained to patient who is extremely hard of hearing.

## 2019-01-19 NOTE — Progress Notes (Signed)
PROGRESS NOTE    NARIN KURLANDER  [id number] DOB: May 20, 1925 DOA: 01/17/2019 PCP: [name], MD   Brief Narrative:  85 year old with a history of essential hypertension, combined diastolic/systolic CHF ef [id number] grade 2 diastolic dysfunction, coronary artery disease, persistent atrial fibrillation on Eliquis, heart block status post pacemaker, BPH came with left hip pain.  Had a fall about a week prior to admission from a stationary bike.  Outpatient x-rays with PCP were negative but due to persistence of pain he came to the hospital.  CT showed left acute impacted subcapital femoral neck fracture.   Assessment & Plan:   Principal Problem:   Fracture of femoral neck, left (HCC) Active Problems:   HTN (hypertension)   Pacemaker   Fall at home, initial encounter   Acute kidney injury superimposed on chronic kidney disease ([place])   Combined systolic and diastolic congestive heart failure (HCC)   Persistent atrial fibrillation   Permanent atrial fibrillation   Preop cardiovascular exam   Acute on chronic combined systolic and diastolic CHF (congestive heart failure) ([place])   Acute impacted subcapital fracture of the left femoral neck, -Outpatient x-rays negative.  Confirmed fracture on the CT -Pain control, incentive spirometer, bowel regimen - Patient underwent operative management on 9/8 -Seen by physical therapy with recommendations for SNF  -Check vitamin D levels  Chronic combined congestive heart failure with reduced ejection fraction, EF [id number], grade 2 diastolic dysfunction, class III - Cardiology consulted.  Strict input and output.  Daily weight - does not appear to be overtly volume overloaded. Will resume home dose of lasix tomorrow.  Acute kidney injury on CKD stage III - Admission creatinine 1.64; baseline 1.3.  Today's 1.84 -hold further lasix today  Persistent atrial fibrillation on chronic anticoagulation -Eliquis was on hold for surgery, heparin drip  -Amiodarone and Coreg -resume eliquis on 9/9  Essential hypertension -Losartan on hold due to AKI.  Continue Coreg and amiodarone  Coronary artery disease -Currently chest pain-free.  Continue home meds  Macrocytic anemia -TSH normal. B12/folate are not low  History of heart block status post [facility] Jude dual-chamber pacemaker -Cardiology is seeing the patient.  GERD -PPI  DVT prophylaxis: Heparin drip Code Status: Full code Family Communication: Son at bedside Disposition Plan: will need placement  Consultants:   Orthopedic  Cardiology  Procedures:   None so far  Antimicrobials:   None   Subjective: When I saw the patient around 9:30 AM this morning, heparin drip was still running.  Patient is reporting of the right side hip pain as expected from a fracture.  Son is at the bedside.  No other complaints at this time.  Objective: Vitals:   01/19/19 1156 01/19/19 1209 01/19/19 1603 01/19/19 2100  BP:  (!) 157/58 (!) 135/59 (!) 148/60  Pulse:  60 (!) 59 60  Resp:  16 16 17   Temp: (!) 97 F (36.1 C) 97.6 F (36.4 C) 98.3 F (36.8 C) (!) 97.4 F (36.3 C)  TempSrc:  Oral Oral Oral  SpO2:  100% 99% 99%  Weight:      Height:        Intake/Output Summary (Last 24 hours) at 01/19/2019 2254 Last data filed at 01/19/2019 1741 Gross per 24 hour  Intake 1684.24 ml  Output 2375 ml  Net -690.76 ml   Filed Weights   01/18/19 0200 01/19/19 0543  Weight: 73 kg 72.9 kg    Examination:  General exam: Appears calm and comfortable  Respiratory system: Clear to auscultation.  Respiratory effort normal. Cardiovascular system: S1 & S2 heard, RRR. No JVD, murmurs, rubs, gallops or clicks. No pedal edema. Gastrointestinal system: Abdomen is nondistended, soft and nontender. No organomegaly or masses felt. Normal bowel sounds heard. Central nervous system: Alert and oriented. No focal neurological deficits. Extremities: Limited range of motion of the right hip Skin: No  rashes, lesions or ulcers Psychiatry: mildly confused    Data Reviewed:   CBC: Recent Labs  Lab 01/17/19 1036 01/18/19 0434 01/18/19 0824 01/19/19 0355  WBC 7.5 6.7  --  7.0  NEUTROABS 5.1  --   --   --   HGB 13.6 12.7*  --  12.8*  HCT 40.1 37.7* 37.3* 36.0*  MCV 104.7* 100.8*  --  102.9*  PLT 278 279  --  [id number]   Basic Metabolic Panel: Recent Labs  Lab 01/17/19 1036 01/18/19 0434 01/19/19 0355  NA 139 137 136  K 5.0 4.4 4.1  CL 103 99 98  CO2 26 26 29   GLUCOSE 100* 111* 119*  BUN 48* 45* 44*  CREATININE 1.64* 1.73* 1.84*  CALCIUM 9.0 8.4* 8.5*  MG  --   --  2.1   GFR: Estimated Creatinine Clearance: 26.7 mL/min (A) (by C-G formula based on SCr of 1.84 mg/dL (H)). Liver Function Tests: Recent Labs  Lab 01/17/19 1036  AST 32  ALT 31  ALKPHOS 100  BILITOT 1.6*  PROT 6.5  ALBUMIN 3.0*   No results for input(s): LIPASE, AMYLASE in the last 168 hours. No results for input(s): AMMONIA in the last 168 hours. Coagulation Profile: No results for input(s): INR, PROTIME in the last 168 hours. Cardiac Enzymes: No results for input(s): CKTOTAL, CKMB, CKMBINDEX, TROPONINI in the last 168 hours. BNP (last 3 results) Recent Labs    10/08/18 1419  PROBNP 3,397*   HbA1C: No results for input(s): HGBA1C in the last 72 hours. CBG: No results for input(s): GLUCAP in the last 168 hours. Lipid Profile: No results for input(s): CHOL, HDL, LDLCALC, TRIG, CHOLHDL, LDLDIRECT in the last 72 hours. Thyroid Function Tests: Recent Labs    01/18/19 1033  TSH 0.989   Anemia Panel: Recent Labs    01/18/19 0824  VITAMINB12 1,493*   Sepsis Labs: No results for input(s): PROCALCITON, LATICACIDVEN in the last 168 hours.  Recent Results (from the past 240 hour(s))  SARS Coronavirus 2 [facility] order, Performed in [facility] hospital lab) Nasopharyngeal Nasopharyngeal Swab     Status: None   Collection Time: 01/17/19  3:45 PM   Specimen: Nasopharyngeal Swab  Result Value  Ref Range Status   SARS Coronavirus 2 NEGATIVE NEGATIVE Final    Comment: (NOTE) If result is NEGATIVE SARS-CoV-2 target nucleic acids are NOT DETECTED. The SARS-CoV-2 RNA is generally detectable in upper and lower  respiratory specimens during the acute phase of infection. The lowest  concentration of SARS-CoV-2 viral copies this assay can detect is 250  copies / mL. A negative result does not preclude SARS-CoV-2 infection  and should not be used as the sole basis for treatment or other  patient management decisions.  A negative result may occur with  improper specimen collection / handling, submission of specimen other  than nasopharyngeal swab, presence of viral mutation(s) within the  areas targeted by this assay, and inadequate number of viral copies  (<250 copies / mL). A negative result must be combined with clinical  observations, patient history, and epidemiological information. If result is POSITIVE SARS-CoV-2 target nucleic acids are DETECTED. The  SARS-CoV-2 RNA is generally detectable in upper and lower  respiratory specimens dur ing the acute phase of infection.  Positive  results are indicative of active infection with SARS-CoV-2.  Clinical  correlation with patient history and other diagnostic information is  necessary to determine patient infection status.  Positive results do  not rule out bacterial infection or co-infection with other viruses. If result is PRESUMPTIVE POSTIVE SARS-CoV-2 nucleic acids MAY BE PRESENT.   A presumptive positive result was obtained on the submitted specimen  and confirmed on repeat testing.  While 2019 novel coronavirus  (SARS-CoV-2) nucleic acids may be present in the submitted sample  additional confirmatory testing may be necessary for epidemiological  and / or clinical management purposes  to differentiate between  SARS-CoV-2 and other Sarbecovirus currently known to infect humans.  If clinically indicated additional testing with an  alternate test  methodology [phone]) is advised. The SARS-CoV-2 RNA is generally  detectable in upper and lower respiratory sp ecimens during the acute  phase of infection. The expected result is Negative. Fact Sheet for Patients:  StrictlyIdeas.no Fact Sheet for Healthcare Providers: BankingDealers.co.za This test is not yet approved or cleared by the [place] FDA and has been authorized for detection and/or diagnosis of SARS-CoV-2 by FDA under an Emergency Use Authorization (EUA).  This EUA will remain in effect (meaning this test can be used) for the duration of the COVID-19 declaration under Section 564(b)(1) of the Act, 21 U.S.C. section 360bbb-3(b)(1), unless the authorization is terminated or revoked sooner. Performed at [facility], [place] [address], [place],  [address]   Surgical PCR screen     Status: None   Collection Time: 01/17/19  9:56 PM   Specimen: Nasal Mucosa; Nasal Swab  Result Value Ref Range Status   MRSA, PCR NEGATIVE NEGATIVE Final   Staphylococcus aureus NEGATIVE NEGATIVE Final    Comment: (NOTE) The Xpert SA Assay (FDA approved for NASAL specimens in patients 30 years of age and older), is one component of a comprehensive surveillance program. It is not intended to diagnose infection nor to guide or monitor treatment. Performed at [facility], [place] [address] [place],  [address]          Radiology Studies: Dg Hip Port Unilat With Pelvis 1v Left  Result Date: 01/19/2019 CLINICAL DATA:  Left hip replacement. EXAM: DG HIP (WITH OR WITHOUT PELVIS) 1V PORT LEFT COMPARISON:  None. FINDINGS: The left femoral prosthesis appears to be well situated. No fracture or dislocation is noted. Expected postoperative changes are seen in the surrounding soft tissues. IMPRESSION: Status post left hip arthroplasty. Electronically Signed   By: Marijo Conception M.D.   On: 01/19/2019 13:16         Scheduled Meds: . amiodarone  200 mg Oral Daily  . carvedilol  6.25 mg Oral BID WC  . docusate sodium  100 mg Oral BID  . gabapentin  300 mg Oral TID  . pantoprazole  40 mg Oral Daily   Continuous Infusions: . heparin 750 Units/hr (01/19/19 2206)     LOS: 2 days   Time spent= 35 mins    Kathie Dike, MD [facility]  If 7PM-7AM, please contact night-coverage [email or web address] 01/19/2019, 10:54 PM

## 2019-01-19 NOTE — Evaluation (Signed)
Physical Therapy Evaluation Patient Details Name: Frank Elliott MRN: [id number] DOB: 1926/01/17 Today's Date: 01/19/2019   History of Present Illness  85 yo male s/p multiple falls at home; imaging of L hip at PCP negative for acute fracture, however pain and immobility worsened and in ED imaging showed acute impacted subcapital fracture of L femoral neck. He received L posterior THR on 01/19/19. PMH OA, BPH, CHF, neuropathy, NICM, A-fib, syncope, hx back surgery, cardiac cath, pacemaker placement, R THR  Clinical Impression   Patient received in bed, willing to participate in PT but extremely hard of hearing and not able to follow written communication well, making communication/interaction with this patient very challenging. Spent quite a bit of time educating on role and need for PT following surgery, then attempted bed mobility for which he required totalA to maintain posterior hip precautions; performed partial transfer to EOB then patient became upset and refused to participate more, stating "I don't understand any of this, I won't do more until we talk to my son" and was returned to bed and positioned to comfort with totalA. Continued to attempt to educate patient but he then fell asleep in bed. He was left in bed with hip abduction pillow in place, bed alarm active, and all needs otherwise met this afternoon. He will continue to benefit from skilled PT services in the acute setting, also strongly recommend ST-SNF due to current difficulty with mobility and history of recent multiple falls with significant injury. Will need +2 assist to safely progress mobility given difficulties with communication and mobility noted today.     Follow Up Recommendations SNF;Supervision/Assistance - 24 hour    Equipment Recommendations  None recommended by PT(defer to next venue)    Recommendations for Other Services Speech consult;OT consult     Precautions / Restrictions Precautions Precautions: Posterior  Hip;ICD/Pacemaker Precaution Booklet Issued: Yes (comment) Precaution Comments: posterior hip precautions and WBAT L LE Required Braces or Orthoses: Other Brace Other Brace: abduction pillow when in bed/chair Restrictions Weight Bearing Restrictions: Yes LLE Weight Bearing: Weight bearing as tolerated      Mobility  Bed Mobility Overal bed mobility: Needs Assistance Bed Mobility: Supine to Sit     Supine to sit: Total assist     General bed mobility comments: attempted supine to sit, able to perform partial transfers with totalA and zero effort from patient before he became upset and refused to continue before his son talked to therapist  Transfers                 General transfer comment: unable to attempt, patient refusing/resisting- will likely need +2 initially for safety  Ambulation/Gait             General Gait Details: unable to attempt, patient refusing/resisting- will likely need +2 initially for safety  Stairs            Wheelchair Mobility    Modified Rankin (Stroke Patients Only)       Balance Overall balance assessment: History of Falls                                           Pertinent Vitals/Pain Pain Assessment: Faces Faces Pain Scale: Hurts little more Pain Location: L LE with movement Pain Descriptors / Indicators: Aching;Guarding;Discomfort Pain Intervention(s): Limited activity within patient's tolerance;Monitored during session    Home Living Family/patient expects to  be discharged to:: Private residence Living Arrangements: Alone Available Help at Discharge: Family;Available PRN/intermittently Type of Home: House Home Access: Level entry     Home Layout: One level Home Equipment: Shower seat;Wheelchair - manual;Cane - single point;Walker - 2 wheels Additional Comments: information above is for his son's house and from chart review; unable to get accurate information about patient's own home today     Prior Function Level of Independence: Independent with assistive device(s)         Comments: Uses RW for ambulation. Reports getting HHPT PTA.      Hand Dominance   Dominant Hand: Right    Extremity/Trunk Assessment   Upper Extremity Assessment Upper Extremity Assessment: Generalized weakness    Lower Extremity Assessment Lower Extremity Assessment: Generalized weakness    Cervical / Trunk Assessment Cervical / Trunk Assessment: Normal  Communication   Communication: HOH  Cognition Arousal/Alertness: Suspect due to medications Behavior During Therapy: Anxious Overall Cognitive Status: Difficult to assess                                 General Comments: patient extremely HOH and with difficulty communicating with PT even in written format      General Comments      Exercises     Assessment/Plan    PT Assessment Patient needs continued PT services  PT Problem List Decreased strength;Decreased mobility;Decreased safety awareness;Decreased coordination;Decreased knowledge of precautions;Decreased activity tolerance;Decreased knowledge of use of DME;Decreased balance;Pain       PT Treatment Interventions DME instruction;Therapeutic activities;Gait training;Therapeutic exercise;Patient/family education;Stair training;Balance training;Functional mobility training;Neuromuscular re-education    PT Goals (Current goals can be found in the Care Plan section)  Acute Rehab PT Goals PT Goal Formulation: Patient unable to participate in goal setting    Frequency Min 3X/week   Barriers to discharge Decreased caregiver support      Co-evaluation               AM-PAC PT "6 Clicks" Mobility  Outcome Measure Help needed turning from your back to your side while in a flat bed without using bedrails?: A Lot Help needed moving from lying on your back to sitting on the side of a flat bed without using bedrails?: Total Help needed moving to and from a  bed to a chair (including a wheelchair)?: A Lot Help needed standing up from a chair using your arms (e.g., wheelchair or bedside chair)?: A Lot Help needed to walk in hospital room?: A Lot Help needed climbing 3-5 steps with a railing? : Total 6 Click Score: 10    End of Session Equipment Utilized During Treatment: Oxygen Activity Tolerance: Patient tolerated treatment well Patient left: in bed;with call bell/phone within reach;with bed alarm set;Other (comment)(in hip ABD pillow)   PT Visit Diagnosis: Unsteadiness on feet (R26.81);History of falling (Z91.81);Difficulty in walking, not elsewhere classified (R26.2);Muscle weakness (generalized) (M62.81);Repeated falls (R29.6);Pain Pain - Right/Left: Left Pain - part of body: Leg    Time: 1510-1540 PT Time Calculation (min) (ACUTE ONLY): 30 min   Charges:   PT Evaluation $PT Eval Moderate Complexity: 1 Mod PT Treatments $Therapeutic Activity: 8-22 mins        Deniece Ree PT, DPT, CBIS  Supplemental Physical Therapist [place]    Pager [phone] Acute Rehab Office [phone]

## 2019-01-19 NOTE — Anesthesia Procedure Notes (Signed)
Procedure Name: Intubation Date/Time: 01/19/2019 8:22 AM Performed by: Neldon Newport, CRNA Pre-anesthesia Checklist: Timeout performed, Patient being monitored, Suction available, Emergency Drugs available and Patient identified Patient Re-evaluated:Patient Re-evaluated prior to induction Oxygen Delivery Method: Circle system utilized Preoxygenation: Pre-oxygenation with 100% oxygen Induction Type: IV induction Ventilation: Mask ventilation without difficulty and Oral airway inserted - appropriate to patient size Laryngoscope Size: Mac and 3 Grade View: Grade I Tube type: Oral Tube size: 7.5 mm Number of attempts: 1 Placement Confirmation: breath sounds checked- equal and bilateral,  positive ETCO2 and ETT inserted through vocal cords under direct vision Secured at: 22 cm Tube secured with: Tape Dental Injury: Teeth and Oropharynx as per pre-operative assessment

## 2019-01-19 NOTE — Care Management (Signed)
CM consult acknowledged for HH/DME arrangement. Patient is POD#0 Left hip Hemi; awaiting PT/OT evals for TOC recommendations. CM team will continue to follow.  Midge Minium RN, BSN, NCM-BC, ACM-RN [phone]

## 2019-01-19 NOTE — Progress Notes (Addendum)
ANTICOAGULATION CONSULT NOTE - Follow Up Consult  Pharmacy Consult for Heparin Indication: atrial fibrillation (Eliquis on hold)  Allergies  Allergen Reactions  . Aspirin Anaphylaxis, Hives and Swelling  . Whiskey [Alcohol] Anaphylaxis and Hives  . Amoxicillin Other (See Comments)    Causes sore throat  . Avelox [Moxifloxacin Hcl In Nacl] Diarrhea and Other (See Comments)    Constipation, also  . Lisinopril Cough  . Sulfa Antibiotics Hives  . Tape Itching    Also causes redness--Paper tape okay  . Zithromax [Azithromycin] Other (See Comments)    Sore throat  . Penicillins Rash    Did it involve swelling of the face/tongue/throat, SOB, or low BP? Yes Did it involve sudden or severe rash/hives, skin peeling, or any reaction on the inside of your mouth or nose? Unk Did you need to seek medical attention at a hospital or doctor's office? Unk When did it last happen?Years ago If all above answers are "NO", may proceed with cephalosporin use.     Patient Measurements: Height: 5\' 9"  (175.3 cm) Weight: 160 lb 11.5 oz (72.9 kg) IBW/kg (Calculated) : 70.7 Heparin Dosing Weight: 73 kg  Vital Signs: Temp: 98.3 F (36.8 C) (09/08 1603) Temp Source: Oral (09/08 1603) BP: 135/59 (09/08 1603) Pulse Rate: 59 (09/08 1603)  Labs: Recent Labs    01/17/19 1036  01/18/19 0434 01/18/19 0824 01/18/19 1609 01/19/19 0355 01/19/19 1811 01/19/19 2005  HGB 13.6  --  12.7*  --   --  12.8*  --   --   HCT 40.1  --  37.7* 37.3*  --  36.0*  --   --   PLT 278  --  279  --   --  301  --   --   APTT  --    < > 170*  --  124*  --   --  55*  HEPARINUNFRC  --    < > >2.20*  --  >2.20*  --  >2.20* >2.20*  CREATININE 1.64*  --  1.73*  --   --  1.84*  --   --    < > = values in this interval not displayed.    Estimated Creatinine Clearance: 26.7 mL/min (A) (by C-G formula based on SCr of 1.84 mg/dL (H)).   Medications:  Scheduled:  . amiodarone  200 mg Oral Daily  . carvedilol  6.25 mg Oral  BID WC  . docusate sodium  100 mg Oral BID  . gabapentin  300 mg Oral TID  . pantoprazole  40 mg Oral Daily   Infusions:  . heparin 650 Units/hr (01/19/19 1421)    Assessment: 85 yo M on Apixaban PTA for hx of afib, last dose 9/6. Pt has been transitioned to IV heparin in anticipation of orthopedic surgery on 9/8. Heparin was stopped at 0400 on 9/8 pre-op, and resumed at 1220 on 9/8 post-op at 650 units/hr. Heparin level was drawn at 1811, less than 4 hours from start of infusion, and supratherapeutic >2.20. No aPTT was ordered or drawn. No bleeding noted per RN.  Update: aPTT and heparin levels drawn at 2005 (~7.5 hrs after heparin infusion restarted at 650 units/hr after surgery) were 55 sec and >2.20 units/ml, respectively. APTT is below the desired target range for this patient. Heparin level remains high, indicating effect of apixaban. Per RN, no issues with IV or signs/sx of bleeding.  Goal of Therapy:  Heparin level 0.3-0.7 units/ml aPTT 66-102 seconds Monitor platelets by anticoagulation protocol: Yes  Plan:  Increase heparin infusion to 750 units/hr Check 8-hr heparin level, then daily Check CBC daily Monitor for signs/symptoms of bleeding  Gillermina Hu, PharmD, BCPS, [facility] Clinical Pharmacist 01/19/2019 9:51 PM

## 2019-01-20 ENCOUNTER — Ambulatory Visit: Payer: Medicare Other | Admitting: Cardiology

## 2019-01-20 ENCOUNTER — Encounter (HOSPITAL_COMMUNITY): Payer: Self-pay | Admitting: Orthopedic Surgery

## 2019-01-20 LAB — CBC
HCT: 28.2 % — ABNORMAL LOW (ref 39.0–52.0)
Hemoglobin: 9.6 g/dL — ABNORMAL LOW (ref 13.0–17.0)
MCH: 34.7 pg — ABNORMAL HIGH (ref 26.0–34.0)
MCHC: 34 g/dL (ref 30.0–36.0)
MCV: 101.8 fL — ABNORMAL HIGH (ref 80.0–100.0)
Platelets: 325 10*3/uL (ref 150–400)
RBC: 2.77 MIL/uL — ABNORMAL LOW (ref 4.22–5.81)
RDW: 13.2 % (ref 11.5–15.5)
WBC: 11 10*3/uL — ABNORMAL HIGH (ref 4.0–10.5)
nRBC: 0 % (ref 0.0–0.2)

## 2019-01-20 LAB — BASIC METABOLIC PANEL
Anion gap: 9 (ref 5–15)
BUN: 56 mg/dL — ABNORMAL HIGH (ref 8–23)
CO2: 27 mmol/L (ref 22–32)
Calcium: 8 mg/dL — ABNORMAL LOW (ref 8.9–10.3)
Chloride: 101 mmol/L (ref 98–111)
Creatinine, Ser: 2.11 mg/dL — ABNORMAL HIGH (ref 0.61–1.24)
GFR calc Af Amer: 31 mL/min — ABNORMAL LOW (ref >60)
GFR calc non Af Amer: 27 mL/min — ABNORMAL LOW (ref >60)
Glucose, Bld: 129 mg/dL — ABNORMAL HIGH (ref 70–99)
Potassium: 4.8 mmol/L (ref 3.5–5.1)
Sodium: 137 mmol/L (ref 135–145)

## 2019-01-20 LAB — MAGNESIUM: Magnesium: 2.2 mg/dL (ref 1.7–2.4)

## 2019-01-20 LAB — HEPARIN LEVEL (UNFRACTIONATED): Heparin Unfractionated: >2.2 IU/mL — ABNORMAL HIGH (ref 0.30–0.70)

## 2019-01-20 LAB — APTT: aPTT: 78 seconds — ABNORMAL HIGH (ref 24–36)

## 2019-01-20 MED ORDER — ACETAMINOPHEN 325 MG PO TABS
650.0000 mg | ORAL_TABLET | Freq: Three times a day (TID) | ORAL | Status: DC
  Administered 2019-01-20 – 2019-01-28 (×19): 650 mg via ORAL
  Filled 2019-01-20 (×21): qty 2

## 2019-01-20 MED ORDER — PROSIGHT PO TABS
1.0000 | ORAL_TABLET | Freq: Every day | ORAL | Status: DC
  Administered 2019-01-20 – 2019-01-28 (×8): 1 via ORAL
  Filled 2019-01-20 (×9): qty 1

## 2019-01-20 MED ORDER — SODIUM CHLORIDE 0.9 % IV BOLUS
500.0000 mL | Freq: Once | INTRAVENOUS | Status: AC
  Administered 2019-01-20: 500 mL via INTRAVENOUS

## 2019-01-20 MED ORDER — ENSURE ENLIVE PO LIQD
237.0000 mL | Freq: Every day | ORAL | Status: DC
  Administered 2019-01-20 – 2019-01-25 (×6): 237 mL via ORAL

## 2019-01-20 MED ORDER — SODIUM CHLORIDE 0.9 % IV SOLN
INTRAVENOUS | Status: DC
  Administered 2019-01-20 – 2019-01-21 (×2): via INTRAVENOUS

## 2019-01-20 MED ORDER — APIXABAN 2.5 MG PO TABS
2.5000 mg | ORAL_TABLET | Freq: Two times a day (BID) | ORAL | Status: DC
  Administered 2019-01-20 – 2019-01-21 (×4): 2.5 mg via ORAL
  Filled 2019-01-20 (×4): qty 1

## 2019-01-20 MED ORDER — VITAMIN C 500 MG PO TABS
500.0000 mg | ORAL_TABLET | Freq: Every day | ORAL | Status: DC
  Administered 2019-01-20 – 2019-01-28 (×8): 500 mg via ORAL
  Filled 2019-01-20 (×8): qty 1

## 2019-01-20 MED ORDER — VITAMIN D 25 MCG (1000 UNIT) PO TABS
2000.0000 [IU] | ORAL_TABLET | Freq: Two times a day (BID) | ORAL | Status: DC
  Administered 2019-01-20 – 2019-01-28 (×15): 2000 [IU] via ORAL
  Filled 2019-01-20 (×16): qty 2

## 2019-01-20 NOTE — Progress Notes (Signed)
Physical Therapy Treatment Patient Details Name: Frank Elliott MRN: [id number] DOB: 06/12/1925 Today's Date: 01/20/2019    History of Present Illness 85 yo male s/p multiple falls at home; imaging of L hip at PCP negative for acute fracture, however pain and immobility worsened and in ED imaging showed acute impacted subcapital fracture of L femoral neck. He received L posterior THR on 01/19/19. PMH OA, BPH, CHF, neuropathy, NICM, A-fib, syncope, hx back surgery, cardiac cath, pacemaker placement, R THR    PT Comments    Pt performed transfer training and supine LE exercises.  He required mod +2 for physical assistance during today's session.  Pt continues to benefit from skilled nursing facility placement for continued rehab before returning home.  He responded better to PT this afternoon.     Follow Up Recommendations  SNF;Supervision/Assistance - 24 hour     Equipment Recommendations  None recommended by PT(defer to next venue)    Recommendations for Other Services       Precautions / Restrictions Precautions Precautions: Posterior Hip;Fall Precaution Booklet Issued: Yes (comment) Precaution Comments: educated pt in posterior hip precautions and provided handout Required Braces or Orthoses: Other Brace Other Brace: abduction pillow when in bed/chair Restrictions Weight Bearing Restrictions: Yes LLE Weight Bearing: Weight bearing as tolerated    Mobility  Bed Mobility Overal bed mobility: Needs Assistance Bed Mobility: Supine to Sit     Supine to sit: Mod assist;+2 for physical assistance     General bed mobility comments: Pt required assistance to move B LEs to edge of bed and to elevate trunk into sitting.  He attempted to elevate trunk unassisted but lacks strength.  Use of bed pad to scoot to edge of bed,  Transfers Overall transfer level: Needs assistance Equipment used: Ambulation equipment used(sara stedy) Transfers: Sit to/from Stand Sit to Stand: +2 physical  assistance;Mod assist         General transfer comment: Bed placed in elevated position and required +2 mod to boost into standing.  Once in standing presents with flexed posture but able to maintain standing with min guard assistance.  Cues for hip extension and forward gaze,  From elevated plate height of stedy able to stand with min +2.  Ambulation/Gait Ambulation/Gait assistance: (NT)               Stairs             Wheelchair Mobility    Modified Rankin (Stroke Patients Only)       Balance Overall balance assessment: History of Falls                                          Cognition Arousal/Alertness: Lethargic;Suspect due to medications Behavior During Therapy: Flat affect Overall Cognitive Status: Impaired/Different from baseline Area of Impairment: Following commands;Problem solving                       Following Commands: Follows one step commands with increased time     Problem Solving: Slow processing;Decreased initiation;Difficulty sequencing;Requires verbal cues;Requires tactile cues General Comments: patient extremely [facility]      Exercises Total Joint Exercises Ankle Circles/Pumps: AROM;Both;10 reps;Supine Quad Sets: AROM;Left;10 reps;Supine Heel Slides: AAROM;Left;10 reps;Supine Hip ABduction/ADduction: AAROM;Left;10 reps;Supine    General Comments        Pertinent Vitals/Pain Pain Assessment: 0-10 Pain Score: 7  Faces Pain  Scale: Hurts even more Pain Location: L LE with movement Pain Descriptors / Indicators: Aching;Guarding;Discomfort Pain Intervention(s): Monitored during session;Repositioned    Home Living Family/patient expects to be discharged to:: Private residence Living Arrangements: Alone Available Help at Discharge: Family;Available PRN/intermittently Type of Home: House Home Access: Level entry   Home Layout: One level Home Equipment: Shower seat;Wheelchair - manual;Cane - single  point;Walker - 2 wheels Additional Comments: pt reports he does not use any device to walk, but sits to shower    Prior Function Level of Independence: Independent      Comments: hx of falls   PT Goals (current goals can now be found in the care plan section) Acute Rehab PT Goals Patient Stated Goal: agreeable to therapy PT Goal Formulation: Patient unable to participate in goal setting Progress towards PT goals: Progressing toward goals    Frequency    Min 3X/week      PT Plan Current plan remains appropriate    Co-evaluation              AM-PAC PT "6 Clicks" Mobility   Outcome Measure  Help needed turning from your back to your side while in a flat bed without using bedrails?: A Lot Help needed moving from lying on your back to sitting on the side of a flat bed without using bedrails?: A Lot Help needed moving to and from a bed to a chair (including a wheelchair)?: A Lot Help needed standing up from a chair using your arms (e.g., wheelchair or bedside chair)?: A Lot Help needed to walk in hospital room?: Total Help needed climbing 3-5 steps with a railing? : Total 6 Click Score: 10    End of Session Equipment Utilized During Treatment: Gait belt Activity Tolerance: Patient tolerated treatment well Patient left: in bed;with call bell/phone within reach;with bed alarm set;Other (comment) Nurse Communication: Mobility status PT Visit Diagnosis: Unsteadiness on feet (R26.81);History of falling (Z91.81);Difficulty in walking, not elsewhere classified (R26.2);Muscle weakness (generalized) (M62.81);Repeated falls (R29.6);Pain Pain - Right/Left: Left Pain - part of body: Leg     Time: [id number] PT Time Calculation (min) (ACUTE ONLY): 18 min  Charges:  $Therapeutic Activity: 8-22 mins                     Frank Elliott, PTA Acute Rehabilitation Services Pager [phone] Office [phone]     Frank Elliott Frank Elliott 01/20/2019, 5:00 PM

## 2019-01-20 NOTE — Evaluation (Signed)
Occupational Therapy Evaluation Patient Details Name: Frank Elliott MRN: [id number] DOB: [birth year] Today's Date: 01/20/2019    History of Present Illness 85 yo male s/p multiple falls at home; imaging of L hip at PCP negative for acute fracture, however pain and immobility worsened and in ED imaging showed acute impacted subcapital fracture of L femoral neck. He received L posterior THR on 01/19/19. PMH OA, BPH, CHF, neuropathy, NICM, A-fib, syncope, hx back surgery, cardiac cath, pacemaker placement, R THR   Clinical Impression   Pt reports being independent in self care prior to admission. He presents with lethargy, closing his eyes at times, but still responded appropriately to questions and commands. Pt with generalized weakness and impaired balance. Stood from lower surface of chair with +2 assistance. Pt requires min to total assist for ADL. Recommending post acute rehab in SNF. Will follow acutely.    Follow Up Recommendations  SNF;Supervision/Assistance - 24 hour    Equipment Recommendations       Recommendations for Other Services       Precautions / Restrictions Precautions Precautions: Posterior Hip;Fall Precaution Booklet Issued: Yes (comment) Precaution Comments: educated pt in posterior hip precautions and provided handout Required Braces or Orthoses: Other Brace Other Brace: abduction pillow when in bed/chair Restrictions Weight Bearing Restrictions: Yes LLE Weight Bearing: Weight bearing as tolerated      Mobility Bed Mobility               General bed mobility comments: received in chair  Transfers Overall transfer level: Needs assistance   Transfers: Sit to/from Stand Sit to Stand: +2 physical assistance;Mod assist         General transfer comment: assist to bring trunk away from back of chair and to position hips at edge of chair, cues for technique to maintain posterior hip precautions, assist to rise and steady    Balance Overall balance  assessment: History of Falls                                         ADL either performed or assessed with clinical judgement   ADL Overall ADL's : Needs assistance/impaired Eating/Feeding: Minimal assistance;Sitting Eating/Feeding Details (indicate cue type and reason): assist to open packages and cut food Grooming: Wash/dry hands;Sitting;Moderate assistance Grooming Details (indicate cue type and reason): for thoroughness Upper Body Bathing: Moderate assistance;Sitting   Lower Body Bathing: Total assistance;+2 for physical assistance;Sit to/from stand   Upper Body Dressing : Moderate assistance;Sitting   Lower Body Dressing: Total assistance;Sit to/from stand;+2 for physical assistance   Toilet Transfer: Total assistance Toilet Transfer Details (indicate cue type and reason): use of stedy Toileting- Clothing Manipulation and Hygiene: Total assistance;+2 for physical assistance;Sit to/from stand               Vision Patient Visual Report: No change from baseline       Perception     Praxis      Pertinent Vitals/Pain Pain Assessment: Faces Faces Pain Scale: Hurts even more Pain Location: L LE with movement Pain Descriptors / Indicators: Aching;Guarding;Discomfort Pain Intervention(s): Repositioned;Ice applied;Monitored during session;Limited activity within patient's tolerance     Hand Dominance Right   Extremity/Trunk Assessment Upper Extremity Assessment Upper Extremity Assessment: Generalized weakness(edematous hands)       Cervical / Trunk Assessment Cervical / Trunk Assessment: Normal   Communication Communication Communication: HOH   Cognition Arousal/Alertness: Lethargic;Suspect due  to medications Behavior During Therapy: Flat affect Overall Cognitive Status: Impaired/Different from baseline Area of Impairment: Following commands;Problem solving                       Following Commands: Follows one step commands with  increased time(and multimodal cues)     Problem Solving: Slow processing;Decreased initiation;Difficulty sequencing;Requires verbal cues;Requires tactile cues     General Comments       Exercises     Shoulder Instructions      Home Living Family/patient expects to be discharged to:: Private residence Living Arrangements: Alone Available Help at Discharge: Family;Available PRN/intermittently Type of Home: House Home Access: Level entry     Home Layout: One level     Bathroom Shower/Tub: Walk-in shower;Door   [employer] Toilet: Standard     Home Equipment: Civil engineer, contracting;Wheelchair - manual;Cane - single point;Walker - 2 wheels   Additional Comments: pt reports he does not use any device to walk, but sits to shower      Prior Functioning/Environment Level of Independence: Independent        Comments: hx of falls        OT Problem List: Decreased strength;Decreased activity tolerance;Impaired balance (sitting and/or standing);Decreased coordination;Decreased cognition;Decreased safety awareness;Decreased knowledge of use of DME or AE;Decreased knowledge of precautions;Pain;Increased edema      OT Treatment/Interventions: Self-care/ADL training;DME and/or AE instruction;Cognitive remediation/compensation;Patient/family education;Balance training    OT Goals(Current goals can be found in the care plan section) Acute Rehab OT Goals Patient Stated Goal: agreeable to therapy OT Goal Formulation: With patient Time For Goal Achievement: 02/03/19 Potential to Achieve Goals: Good ADL Goals Pt Will Perform Grooming: with set-up;sitting Pt Will Perform Upper Body Dressing: with supervision;with set-up;sitting Pt Will Perform Lower Body Dressing: with mod assist;with adaptive equipment;sit to/from stand Pt Will Transfer to Toilet: with mod assist;stand pivot transfer;bedside commode Additional ADL Goal #1: Pt will state 3/3 posterior hip precautions.  OT Frequency: Min  2X/week   Barriers to D/C: Decreased caregiver support          Co-evaluation              AM-PAC OT "6 Clicks" Daily Activity     Outcome Measure Help from another person eating meals?: A Little Help from another person taking care of personal grooming?: A Lot Help from another person toileting, which includes using toliet, bedpan, or urinal?: Total Help from another person bathing (including washing, rinsing, drying)?: A Lot Help from another person to put on and taking off regular upper body clothing?: A Lot Help from another person to put on and taking off regular lower body clothing?: Total 6 Click Score: 11   End of Session Equipment Utilized During Treatment: Gait belt Nurse Communication: Mobility status;Need for lift equipment  Activity Tolerance: Patient limited by lethargy;Patient limited by pain Patient left: in chair;with call bell/phone within reach;with chair alarm set  OT Visit Diagnosis: Unsteadiness on feet (R26.81);Other abnormalities of gait and mobility (R26.89);Pain;Other symptoms and signs involving cognitive function;History of falling (Z91.81);Muscle weakness (generalized) (M62.81)                Time: 1352-1410 OT Time Calculation (min): 18 min Charges:  OT General Charges $OT Visit: 1 Visit OT Evaluation $OT Eval Moderate Complexity: 1 Mod  Nestor Lewandowsky, OTR/L Acute Rehabilitation Services Pager: [phone] Office: [phone]  Malka So 01/20/2019, 2:32 PM

## 2019-01-20 NOTE — Progress Notes (Signed)
ANTICOAGULATION CONSULT NOTE - Follow Up Consult  Pharmacy Consult for Heparin > Eliquis Indication: atrial fibrillation  Allergies  Allergen Reactions  . Aspirin Anaphylaxis, Hives and Swelling  . Whiskey [Alcohol] Anaphylaxis and Hives  . Amoxicillin Other (See Comments)    Causes sore throat  . Avelox [Moxifloxacin Hcl In Nacl] Diarrhea and Other (See Comments)    Constipation, also  . Lisinopril Cough  . Sulfa Antibiotics Hives  . Tape Itching    Also causes redness--Paper tape okay  . Zithromax [Azithromycin] Other (See Comments)    Sore throat  . Penicillins Rash    Did it involve swelling of the face/tongue/throat, SOB, or low BP? Yes Did it involve sudden or severe rash/hives, skin peeling, or any reaction on the inside of your mouth or nose? Unk Did you need to seek medical attention at a hospital or doctor's office? Unk When did it last happen?Years ago If all above answers are "NO", may proceed with cephalosporin use.     Patient Measurements: Height: 5\' 9"  (175.3 cm) Weight: 160 lb 11.5 oz (72.9 kg) IBW/kg (Calculated) : 70.7 Heparin Dosing Weight: 72.9 kg  Vital Signs: Temp: 97.5 F (36.4 C) (09/09 0754) Temp Source: Oral (09/09 0754) BP: 94/65 (09/09 0758) Pulse Rate: 60 (09/09 0758)  Labs: Recent Labs    01/18/19 0434 01/18/19 0824 01/18/19 1609 01/19/19 0355 01/19/19 1811 01/19/19 2005 01/20/19 0525  HGB 12.7*  --   --  12.8*  --   --  9.6*  HCT 37.7* 37.3*  --  36.0*  --   --  28.2*  PLT 279  --   --  301  --   --  325  APTT 170*  --  124*  --   --  55* 78*  HEPARINUNFRC >2.20*  --  >2.20*  --  >2.20* >2.20* >2.20*  CREATININE 1.73*  --   --  1.84*  --   --  2.11*    Estimated Creatinine Clearance: 23.3 mL/min (A) (by C-G formula based on SCr of 2.11 mg/dL (H)).  Assessment:  85 yo M on Apixaban 5 mg BID PTA for hx of afib, last dose 9/6. Pt was transitioned to IV heparin in anticipation of orthopedic surgery on 9/8. Heparin was  stopped at 0400 on 9/8 pre-op, and resumed at 1220 on 9/8 post-op.  Transitioned back to Eliquis on 9/9.    PTA Eliquis dose was 5 mg BID, but 85 yrs old, and creatinine is >1.5, so now needs low dose Eliquis.  Scr <1.5 in May-June this year, but 1.64 on admit 9/6 and now up to 2.11.  Goal of Therapy:  Heparin level 0.3-0.7 units/ml appropriate Eliquis dose for indication Monitor platelets by anticoagulation protocol: Yes   Plan:   Resume Eliquis with 2.5 mg BID, now reduced dose with Scr >1.5.  Heparin drip stopped when giving first Eliquis dose.  Follow renal function.    Arty Baumgartner, [place] Pager: [phone] or phone: [phone] 01/20/2019,12:25 PM

## 2019-01-20 NOTE — Care Management Important Message (Signed)
Important Message  Patient Details  Name: Frank Elliott MRN: [id number] Date of Birth: 02-23-1926   Medicare Important Message Given:  Yes     Memory Argue 01/20/2019, 2:14 PM   DUE TO MEDICAL CONDITION PATIENT UNABLE TO SIGN. COPY LEFT WITH  PATIENT.

## 2019-01-20 NOTE — Progress Notes (Signed)
Initial Nutrition Assessment  DOCUMENTATION CODES:   Not applicable  INTERVENTION:  Provide Ensure Enlive po once daily, each supplement provides 350 kcal and 20 grams of protein.  Encourage adequate PO intake.   NUTRITION DIAGNOSIS:   Increased nutrient needs related to post-op healing as evidenced by estimated needs.  GOAL:   Patient will meet greater than or equal to 90% of their needs  MONITOR:   PO intake, Supplement acceptance, Skin, Weight trends, Labs, I & O's  REASON FOR ASSESSMENT:   Consult Assessment of nutrition requirement/status, Hip fracture protocol  ASSESSMENT:   85 year old with a history of essential hypertension, combined diastolic/systolic CHF ef [id number] grade 2 diastolic dysfunction, coronary artery disease, persistent atrial fibrillation on Eliquis, heart block status post pacemaker, BPH came with left hip pain after fall. CT showed left acute impacted subcapital femoral neck fracture.  PROCEDURE (9/8): ARTHROPLASTY UNIPOLAR HIP (HEMIARTHROPLASTY) (Left)    Meal completion 100%. RD unable to obtain pt nutrition history, pt unable to respond to RD questions appropriately. Pt does report food at meals have been good. RD to order nutritional supplements to aid in post op healing.  NUTRITION - FOCUSED PHYSICAL EXAM: Depletion likely related to the natural aging process.    Most Recent Value  Orbital Region  Unable to assess  Upper Arm Region  Mild depletion  Thoracic and Lumbar Region  No depletion  Buccal Region  Unable to assess  Temple Region  Unable to assess  Clavicle Bone Region  Moderate depletion  Clavicle and Acromion Bone Region  Moderate depletion  Scapular Bone Region  Unable to assess  Dorsal Hand  Unable to assess  Patellar Region  No depletion  Anterior Thigh Region  No depletion  Posterior Calf Region  No depletion  Edema (RD Assessment)  Mild  Hair  Reviewed  Eyes  Reviewed  Mouth  Reviewed  Skin  Reviewed  Nails  Reviewed        Diet Order:   Diet Order            Diet regular Room service appropriate? Yes; Fluid consistency: Thin  Diet effective now              EDUCATION NEEDS:   Not appropriate for education at this time  Skin:  Skin Assessment: Skin Integrity Issues: Skin Integrity Issues:: Incisions Incisions: L hip  Last BM:  9/8  Height:   Ht Readings from Last 1 Encounters:  01/18/19 5\' 9"  (1.753 m)    Weight:   Wt Readings from Last 1 Encounters:  01/19/19 72.9 kg    Ideal Body Weight:  72.7 kg  BMI:  Body mass index is 23.73 kg/m.  Estimated Nutritional Needs:   Kcal:  [id number]  Protein:  80-90 grams  Fluid:  1.7 - 1.9 L/day    Corrin Parker, MS, RD, LDN Pager # [phone] After hours/ weekend pager # [phone]

## 2019-01-20 NOTE — Progress Notes (Signed)
MEWS Guidelines - (patients age 83 and over)  Red - At High Risk for Deterioration Yellow - At risk for Deterioration  1. Go to room and assess patient 2. Validate data. Is this patient's baseline? If data confirmed: 3. Is this an acute change? 4. Administer prn meds/treatments as ordered. 5. Note Sepsis score 6. Review goals of care 7. Sports coach, RRT nurse and Provider. 8. Ask Provider to come to bedside.  9. Document patient condition/interventions/response. 10. Increase frequency of vital signs and focused assessments to at least q15 minutes x 4, then q30 minutes x2. - If stable, then q1h x3, then q4h x3 and then q8h or dept. routine. - If unstable, contact Provider & RRT nurse. Prepare for possible transfer. 11. Add entry in progress notes using the smart phrase ".MEWS". 1. Go to room and assess patient 2. Validate data. Is this patient's baseline? If data confirmed: 3. Is this an acute change? 4. Administer prn meds/treatments as ordered? 5. Note Sepsis score 6. Review goals of care 7. Sports coach and Provider 8. Call RRT nurse as needed. 9. Document patient condition/interventions/response. 10. Increase frequency of vital signs and focused assessments to at least q2h x2. - If stable, then q4h x2 and then q8h or dept. routine. - If unstable, contact Provider & RRT nurse. Prepare for possible transfer. 11. Add entry in progress notes using the smart phrase ".MEWS".  Green - Likely stable Lavender - Comfort Care Only  1. Continue routine/ordered monitoring.  2. Review goals of care. 1. Continue routine/ordered monitoring. 2. Review goals of care.   Patient BP running soft tonight (78/50 and 79/42).  On call MD notified.  On call MD placed order to give NS bolus 500 mL.  Patient is currently sleeping.

## 2019-01-20 NOTE — Progress Notes (Addendum)
Orthopedic Trauma Service Progress Note  Patient ID: Frank Elliott MRN: [id number] DOB/AGE: [birth year] 83 y.o.  Subjective:  States left hip is hurting He is hungry, states he is waiting for breakfast   No CP or SOB No  N/V   ROS As above  Objective:   VITALS:   Vitals:   01/20/19 0353 01/20/19 0620 01/20/19 0754 01/20/19 0758  BP: (!) 93/41 (!) 92/43 (!) 86/46 94/65  Pulse: 60 63 60 60  Resp: 14  18   Temp: 97.7 F (36.5 C)  (!) 97.5 F (36.4 C)   TempSrc: Oral  Oral   SpO2: 94% 94% 93%   Weight:      Height:        Estimated body mass index is 23.73 kg/m as calculated from the following:   Height as of this encounter: 5\' 9"  (1.753 m).   Weight as of this encounter: 72.9 kg.   Intake/Output      09/08 0701 - 09/09 0700 09/09 0701 - 09/10 0700   P.O. 240    I.V. (mL/kg) 1466.5 (20.1)    Other     IV Piggyback 500    Total Intake(mL/kg) 2206.5 (30.3)    Urine (mL/kg/hr) 1825 (1)    Blood 200    Total Output 2025    Net +181.5           LABS  Results for orders placed or performed during the hospital encounter of 01/17/19 (from the past 24 hour(s))  Heparin level (unfractionated)     Status: Abnormal   Collection Time: 01/19/19  6:11 PM  Result Value Ref Range   Heparin Unfractionated >2.20 (H) 0.30 - 0.70 IU/mL  APTT     Status: Abnormal   Collection Time: 01/19/19  8:05 PM  Result Value Ref Range   aPTT 55 (H) 24 - 36 seconds  Heparin level (unfractionated)     Status: Abnormal   Collection Time: 01/19/19  8:05 PM  Result Value Ref Range   Heparin Unfractionated >2.20 (H) 0.30 - 0.70 IU/mL  CBC     Status: Abnormal   Collection Time: 01/20/19  5:25 AM  Result Value Ref Range   WBC 11.0 (H) 4.0 - 10.5 K/uL   RBC 2.77 (L) 4.22 - 5.81 MIL/uL   Hemoglobin 9.6 (L) 13.0 - 17.0 g/dL   HCT 28.2 (L) 39.0 - 52.0 %   MCV 101.8 (H) 80.0 - 100.0 fL   MCH 34.7 (H) 26.0 - 34.0 pg    MCHC 34.0 30.0 - 36.0 g/dL   RDW 13.2 11.5 - 15.5 %   Platelets 325 150 - 400 K/uL   nRBC 0.0 0.0 - 0.2 %  Basic metabolic panel     Status: Abnormal   Collection Time: 01/20/19  5:25 AM  Result Value Ref Range   Sodium 137 135 - 145 mmol/L   Potassium 4.8 3.5 - 5.1 mmol/L   Chloride 101 98 - 111 mmol/L   CO2 27 22 - 32 mmol/L   Glucose, Bld 129 (H) 70 - 99 mg/dL   BUN 56 (H) 8 - 23 mg/dL   Creatinine, Ser 2.11 (H) 0.61 - 1.24 mg/dL   Calcium 8.0 (L) 8.9 - 10.3 mg/dL   GFR calc non Af Amer 27 (L) >60 mL/min   GFR  calc Af Amer 31 (L) >60 mL/min   Anion gap 9 5 - 15  Magnesium     Status: None   Collection Time: 01/20/19  5:25 AM  Result Value Ref Range   Magnesium 2.2 1.7 - 2.4 mg/dL  Heparin level (unfractionated)     Status: Abnormal   Collection Time: 01/20/19  5:25 AM  Result Value Ref Range   Heparin Unfractionated >2.20 (H) 0.30 - 0.70 IU/mL  APTT     Status: Abnormal   Collection Time: 01/20/19  5:25 AM  Result Value Ref Range   aPTT 78 (H) 24 - 36 seconds    Results for Frank Elliott, Frank Elliott (MRN [id number]) as of 01/20/2019 08:46  Ref. Range 01/18/2019 08:24  Vitamin D, 25-Hydroxy Latest Ref Range: 30.0 - 100.0 ng/mL 9.6 (L)   PHYSICAL EXAM:   Gen: in bed, NAD, appears well, HOH Lungs: clear anterior fields Cardiac: irreg irreg   Abd: + BS, NTND Ext:       Left Lower Extremity   Dressing stable but with drainage  Chronic pitting edema to L leg appear stable  Distal motor and sensory functions intact  Ext warm   + DP pulse  Hip abduction pillow in place   Assessment/Plan: 1 Day Post-Op   Principal Problem:   Fracture of femoral neck, left (HCC) Active Problems:   HTN (hypertension)   Pacemaker   Fall at home, initial encounter   Acute kidney injury superimposed on chronic kidney disease ([place])   Combined systolic and diastolic congestive heart failure (HCC)   Persistent atrial fibrillation   Permanent atrial fibrillation   Preop cardiovascular exam    Acute on chronic combined systolic and diastolic CHF (congestive heart failure) ([place])   Anti-infectives (From admission, onward)   Start     Dose/Rate Route Frequency Ordered Stop   01/19/19 1330  clindamycin (CLEOCIN) IVPB 600 mg     600 mg 100 mL/hr over 30 Minutes Intravenous Every 6 hours 01/19/19 1209 01/19/19 2112   01/19/19 0700  clindamycin (CLEOCIN) IVPB 900 mg  Status:  Discontinued     900 mg 100 mL/hr over 30 Minutes Intravenous On call to O.R. 01/18/19 1058 01/18/19 1121   01/19/19 0630  clindamycin (CLEOCIN) IVPB 600 mg     600 mg 100 mL/hr over 30 Minutes Intravenous  Once 01/18/19 1644 01/19/19 0611   01/18/19 1130  clindamycin (CLEOCIN) IVPB 600 mg  Status:  Discontinued     600 mg 100 mL/hr over 30 Minutes Intravenous On call to O.R. 01/18/19 1126 01/18/19 1644   01/18/19 0600  vancomycin (VANCOCIN) IVPB 1000 mg/200 mL premix  Status:  Discontinued     1,000 mg 200 mL/hr over 60 Minutes Intravenous On call to O.R. 01/17/19 1952 01/18/19 1126    .  POD/HD#: 1  85 y/o male s/p Left femoral neck fracture   - fall   - L femoral neck fracture s/p Left hip hemiarthroplasty   WBAT L leg  Posterior hip precautions   Abduction pillow until pt understands hip precautions   PT/OT evals  Ice and elevate for swelling control    Dressing change tomorrow   - Pain management:  Start scheduled tylenol   norco or ultram for breakthrough pain on PRN basis  - ABL anemia/Hemodynamics  Monitor   - Medical issues   Pert primary team    Slightly worsening renal function    Defer to medical team in terms of restarting IVF given history of  CHF  - DVT/PE prophylaxis:  Restart eliquis today   - ID:   periop abx  - Metabolic Bone Disease:  Vitamin d levels are deficient  fx suggestive of osteoporosis    Recommend dexa in outpatient setting in 4-8 weeks   - Activity:  Up with assistance  WBAT L leg  Posterior hip precautions left h ip   - FEN/GI  prophylaxis/Foley/Lines:  Diet as tolerated  Dc foley after therapy   - Impediments to fracture healing:  Low energy fracture  Poor bone quality    Vitamin d deficiency   - Dispo:  PT/OT evals      Jari Pigg, PA-C [phone] (C) 01/20/2019, 8:32 AM  [facility] [place] [address] [phone] Domingo Sep (F)

## 2019-01-20 NOTE — Progress Notes (Signed)
Orthopedic Tech Progress Note Patient Details:  Frank Elliott 1925/11/21 [id number] Applied Over Head Frame with Trapeze while therapy got patient up in the chair Patient ID: Frank Elliott, male   DOB: 10-26-1925, 85 y.o.   MRN: [id number]   Janit Pagan 01/20/2019, 1:48 PM

## 2019-01-20 NOTE — Progress Notes (Signed)
ANTICOAGULATION CONSULT NOTE - Follow Up Consult  Pharmacy Consult for heparin Indication: atrial fibrillation   Labs: Recent Labs    01/17/19 1036  01/18/19 0434 01/18/19 0824 01/18/19 1609 01/19/19 0355 01/19/19 1811 01/19/19 2005 01/20/19 0525  HGB 13.6  --  12.7*  --   --  12.8*  --   --  9.6*  HCT 40.1  --  37.7* 37.3*  --  36.0*  --   --  28.2*  PLT 278  --  279  --   --  301  --   --  325  APTT  --    < > 170*  --  124*  --   --  55* 78*  HEPARINUNFRC  --    < > >2.20*  --  >2.20*  --  >2.20* >2.20*  --   CREATININE 1.64*  --  1.73*  --   --  1.84*  --   --   --    < > = values in this interval not displayed.    Assessment/Plan:  85yo male therapeutic on heparin after rate change. Will continue gtt at current rate and confirm stable with additional PTT.   Wynona Neat, PharmD, BCPS  01/20/2019,6:22 AM

## 2019-01-20 NOTE — Progress Notes (Signed)
Patient continues in Yellow MEWS.  Dr. Waldron Labs notified in secure message.
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
## 2019-01-21 LAB — CBC
HCT: 22.2 % — ABNORMAL LOW (ref 39.0–52.0)
Hemoglobin: 8.1 g/dL — ABNORMAL LOW (ref 13.0–17.0)
MCH: 39.3 pg — ABNORMAL HIGH (ref 26.0–34.0)
MCHC: 36.5 g/dL — ABNORMAL HIGH (ref 30.0–36.0)
MCV: 107.8 fL — ABNORMAL HIGH (ref 80.0–100.0)
Platelets: 266 10*3/uL (ref 150–400)
RBC: 2.06 MIL/uL — ABNORMAL LOW (ref 4.22–5.81)
RDW: 13.9 % (ref 11.5–15.5)
WBC: 11.9 10*3/uL — ABNORMAL HIGH (ref 4.0–10.5)
nRBC: 0 % (ref 0.0–0.2)

## 2019-01-21 LAB — PREPARE RBC (CROSSMATCH)

## 2019-01-21 LAB — MAGNESIUM: Magnesium: 2.2 mg/dL (ref 1.7–2.4)

## 2019-01-21 LAB — BASIC METABOLIC PANEL
Anion gap: 12 (ref 5–15)
BUN: 73 mg/dL — ABNORMAL HIGH (ref 8–23)
CO2: 24 mmol/L (ref 22–32)
Calcium: 7.6 mg/dL — ABNORMAL LOW (ref 8.9–10.3)
Chloride: 98 mmol/L (ref 98–111)
Creatinine, Ser: 2.77 mg/dL — ABNORMAL HIGH (ref 0.61–1.24)
GFR calc Af Amer: 22 mL/min — ABNORMAL LOW (ref >60)
GFR calc non Af Amer: 19 mL/min — ABNORMAL LOW (ref >60)
Glucose, Bld: 115 mg/dL — ABNORMAL HIGH (ref 70–99)
Potassium: 4.7 mmol/L (ref 3.5–5.1)
Sodium: 134 mmol/L — ABNORMAL LOW (ref 135–145)

## 2019-01-21 LAB — PREALBUMIN: Prealbumin: 10.7 mg/dL — ABNORMAL LOW (ref 18–38)

## 2019-01-21 MED ORDER — MIDODRINE HCL 5 MG PO TABS
5.0000 mg | ORAL_TABLET | Freq: Three times a day (TID) | ORAL | Status: DC
  Administered 2019-01-21 – 2019-01-27 (×17): 5 mg via ORAL
  Filled 2019-01-21 (×18): qty 1

## 2019-01-21 MED ORDER — SODIUM CHLORIDE 0.9% IV SOLUTION
Freq: Once | INTRAVENOUS | Status: AC
  Administered 2019-01-21: 16:00:00 via INTRAVENOUS

## 2019-01-21 MED ORDER — SODIUM CHLORIDE 0.9% IV SOLUTION
Freq: Once | INTRAVENOUS | Status: AC
  Administered 2019-01-21: 09:00:00 via INTRAVENOUS

## 2019-01-21 NOTE — Progress Notes (Signed)
Physical Therapy Treatment Patient Details Name: Frank Elliott MRN: [id number] DOB: 09-06-1925 Today's Date: 01/21/2019    History of Present Illness 85 yo male s/p multiple falls at home; imaging of L hip at PCP negative for acute fracture, however pain and immobility worsened and in ED imaging showed acute impacted subcapital fracture of L femoral neck. He received L posterior THR on 01/19/19. PMH OA, BPH, CHF, neuropathy, NICM, A-fib, syncope, hx back surgery, cardiac cath, pacemaker placement, R THR    PT Comments    Pt performed supine LE exercises in bed pre transfer training.  On arrival presented with urinary incontinence.  Performed perianal care and assisted patient into standing with sara stedy sit to stand lift.  Pt able to maintain standing x 10 min.  With increased standing pt presented with the urge to have a BM.  Transferred to the [facility] with sara stedy and moderate assistance to lower into a seated position.   Pt had successful BM and require moderate assistance to stand.  Increased standing time for clean up and dressing changes.  Plan for progression to gt training next session as he is tolerating standing well.  Pt also continues to benefit from skilled nursing placement for rehab before return home.   Of note:  Bandage on L hip was saturated and RN and PA into assess.  Pt also with bleeding from urethra and RN and ortho PA aware.     Follow Up Recommendations  SNF;Supervision/Assistance - 24 hour     Equipment Recommendations  None recommended by PT(defer to next venue)    Recommendations for Other Services Speech consult;OT consult     Precautions / Restrictions Precautions Precautions: Posterior Hip;Fall Precaution Booklet Issued: Yes (comment) Precaution Comments: educated pt in posterior hip precautions and provided handout Other Brace: abduction pillow when in bed/chair Restrictions Weight Bearing Restrictions: Yes LLE Weight Bearing: Weight bearing as  tolerated    Mobility  Bed Mobility Overal bed mobility: Needs Assistance Bed Mobility: Supine to Sit     Supine to sit: Mod assist;Max assist     General bed mobility comments: Pt required max assistance to move B LEs to edge of bed and moderate assistance to elevate trunk into seated position.  Transfers Overall transfer level: Needs assistance Equipment used: Ambulation equipment used(sara stedy) Transfers: Sit to/from Stand Sit to Stand: Mod assist         General transfer comment: Pt required moderate assistance from bed and from bed side commode to achieve standing with use of sara stedy.  Once in standing he is able to maintain standing with close min guard to supervision.  Pt tolerated increased standing tolerance with cues for hip extension and upper trunk control,  Pt able to stand x 10 min and x 8 min during session  Ambulation/Gait Ambulation/Gait assistance: (NT)               Stairs             Wheelchair Mobility    Modified Rankin (Stroke Patients Only)       Balance Overall balance assessment: History of Falls;Needs assistance Sitting-balance support: Bilateral upper extremity supported Sitting balance-Leahy Scale: Poor       Standing balance-Leahy Scale: Fair Standing balance comment: fair static balance once assisted into standing.  Heavy reliance on holding stedy cross bar in standing.  Cognition Arousal/Alertness: Lethargic;Suspect due to medications Behavior During Therapy: Flat affect Overall Cognitive Status: Impaired/Different from baseline Area of Impairment: Following commands;Problem solving                       Following Commands: Follows one step commands with increased time     Problem Solving: Slow processing;Decreased initiation;Difficulty sequencing;Requires verbal cues;Requires tactile cues General Comments: patient extremely [facility]      Exercises General Exercises  - Lower Extremity Ankle Circles/Pumps: AROM;Both;10 reps;Supine Quad Sets: AROM;Left;10 reps;Supine Heel Slides: AAROM;Left;10 reps;Supine Hip ABduction/ADduction: AAROM;Left;10 reps;Supine    General Comments        Pertinent Vitals/Pain Pain Assessment: 0-10 Pain Score: 4  Pain Location: L LE with movement Pain Descriptors / Indicators: Aching;Guarding;Discomfort Pain Intervention(s): Monitored during session;Repositioned    Home Living                      Prior Function            PT Goals (current goals can now be found in the care plan section) Acute Rehab PT Goals PT Goal Formulation: With patient Progress towards PT goals: Progressing toward goals    Frequency    Min 3X/week      PT Plan Current plan remains appropriate    Co-evaluation              AM-PAC PT "6 Clicks" Mobility   Outcome Measure  Help needed turning from your back to your side while in a flat bed without using bedrails?: A Lot Help needed moving from lying on your back to sitting on the side of a flat bed without using bedrails?: A Lot Help needed moving to and from a bed to a chair (including a wheelchair)?: A Lot Help needed standing up from a chair using your arms (e.g., wheelchair or bedside chair)?: A Lot Help needed to walk in hospital room?: Total Help needed climbing 3-5 steps with a railing? : Total 6 Click Score: 10    End of Session Equipment Utilized During Treatment: Gait belt Activity Tolerance: Patient tolerated treatment well Patient left: with call bell/phone within reach;Other (comment);in chair;with chair alarm set Nurse Communication: Mobility status PT Visit Diagnosis: Unsteadiness on feet (R26.81);History of falling (Z91.81);Difficulty in walking, not elsewhere classified (R26.2);Muscle weakness (generalized) (M62.81);Repeated falls (R29.6);Pain Pain - Right/Left: Left Pain - part of body: Leg     Time: [id number] PT Time Calculation (min)  (ACUTE ONLY): 54 min  Charges:  $Therapeutic Exercise: 8-22 mins $Therapeutic Activity: 38-52 mins                     Governor Rooks, PTA Acute Rehabilitation Services Pager [phone] Office [phone]     Frank Elliott 01/21/2019, 4:47 PM

## 2019-01-21 NOTE — Plan of Care (Signed)

## 2019-01-21 NOTE — Progress Notes (Signed)
PROGRESS NOTE    Frank Elliott  [id number] DOB: 12/04/1925 DOA: 01/17/2019 PCP: [name], MD   Brief Narrative:  85 year old with a history of essential hypertension, combined diastolic/systolic CHF ef [id number] grade 2 diastolic dysfunction, coronary artery disease, persistent atrial fibrillation on Eliquis, heart block status post pacemaker, BPH came with left hip pain.  Had a fall about a week prior to admission from a stationary bike.  Outpatient x-rays with PCP were negative but due to persistence of pain he came to the hospital.  CT showed left acute impacted subcapital femoral neck fracture.   Subjective:  Patient extremely hard of hearing, he denies any complaints, no significant events overnight per staff  Assessment & Plan:   Principal Problem:   Fracture of femoral neck, left (HCC) Active Problems:   HTN (hypertension)   Pacemaker   Fall at home, initial encounter   Acute kidney injury superimposed on chronic kidney disease ([place])   Combined systolic and diastolic congestive heart failure (HCC)   Persistent atrial fibrillation   Permanent atrial fibrillation   Preop cardiovascular exam   Acute on chronic combined systolic and diastolic CHF (congestive heart failure) ([place])   Acute impacted subcapital fracture of the left femoral neck, -Outpatient x-rays negative.  Confirmed fracture on the CT -Pain control, incentive spirometer, bowel regimen - Patient underwent operative management on 9/8 -Seen by physical therapy with recommendations for SNF  - low vitamin D levels,  started on supplemrnt  Chronic combined congestive heart failure with reduced ejection fraction, EF [id number], grade 2 diastolic dysfunction, class III -  Cardiology consulted.  Strict input and output.  Daily weight -Lasix remains on hold given worsening renal function, and soft blood pressure -Patient was on IV fluids over last 24 hours, started to have some lower extremity edema, will hold IV fluids  today especially he is getting PRBCs. -Beta-blockers on hold giving low blood pressure.  Acute kidney injury on CKD stage III - Baseline creatinine 1.3, elevated at admission to 1.6, continues to progressively worsen, it is 2.7 today despite receiving IV fluids and holding Lasix. -This is most likely in the setting of low blood pressure contributing to renal failure, he will receive PRBC today.  Acute blood loss anemia -Hemoglobin dropped to 8.1 today, patient will receive 2 units PRBC especially with low blood pressure and worsening renal function.  Persistent atrial fibrillation on chronic anticoagulation -Eliquis was on hold for surgery, heparin drip initially, currently back on Eliquis -Continue with amiodarone for heart rate control, I have stopped Coreg given soft blood pressure  Essential hypertension -Pressure remains soft, continue to hold losartan especially with AKI, I have stopped Coreg  with worsening renal function and low blood pressure,  -Patient blood pressure is low, started on Midodrin.  Coronary artery disease -Currently chest pain-free.  Continue home meds  History of heart block status post [facility] Jude dual-chamber pacemaker -Cardiology is seeing the patient.  GERD -PPI  DVT prophylaxis: Heparin drip Code Status: Full code Family Communication: Discussed with son at bedside today Disposition Plan: will need placement  Consultants:   Orthopedic  Cardiology  Procedures:   None so far  Antimicrobials:   None   Objective: Vitals:   01/21/19 0500 01/21/19 0738 01/21/19 1150 01/21/19 1335  BP:  (!) 86/41 (!) 91/39 (!) 96/48  Pulse:  (!) 57 (!) 50 (!) 57  Resp:  16 16 16   Temp:  97.6 F (36.4 C) 97.7 F (36.5 C) 98 F (36.7 C)  TempSrc:  Oral Oral Oral  SpO2:  95% 100% 97%  Weight: 76.4 kg     Height:        Intake/Output Summary (Last 24 hours) at 01/21/2019 1434 Last data filed at 01/21/2019 1313 Gross per 24 hour  Intake 2115.36 ml   Output 800 ml  Net 1315.36 ml   Filed Weights   01/18/19 0200 01/19/19 0543 01/21/19 0500  Weight: 73 kg 72.9 kg 76.4 kg    Examination:  Awake Alert, pleasantly confused, extremely hard of hearing. Symmetrical Chest wall movement, Good air movement bilaterally, CTAB RRR,No Gallops,Rubs or new Murmurs, No Parasternal Heave +ve B.Sounds, Abd Soft, No tenderness, No rebound - guarding or rigidity. No Cyanosis, Clubbing, +1 edema, No new Rash or bruise      Data Reviewed:   CBC: Recent Labs  Lab 01/17/19 1036 01/18/19 0434 01/18/19 0824 01/19/19 0355 01/20/19 0525 01/21/19 0412  WBC 7.5 6.7  --  7.0 11.0* 11.9*  NEUTROABS 5.1  --   --   --   --   --   HGB 13.6 12.7*  --  12.8* 9.6* 8.1*  HCT 40.1 37.7* 37.3* 36.0* 28.2* 22.2*  MCV 104.7* 100.8*  --  102.9* 101.8* 107.8*  PLT 278 279  --  301 325 [id number]   Basic Metabolic Panel: Recent Labs  Lab 01/17/19 1036 01/18/19 0434 01/19/19 0355 01/20/19 0525 01/21/19 0412  NA 139 137 136 137 134*  K 5.0 4.4 4.1 4.8 4.7  CL 103 99 98 101 98  CO2 26 26 29 27 24   GLUCOSE 100* 111* 119* 129* 115*  BUN 48* 45* 44* 56* 73*  CREATININE 1.64* 1.73* 1.84* 2.11* 2.77*  CALCIUM 9.0 8.4* 8.5* 8.0* 7.6*  MG  --   --  2.1 2.2 2.2   GFR: Estimated Creatinine Clearance: 17.7 mL/min (A) (by C-G formula based on SCr of 2.77 mg/dL (H)). Liver Function Tests: Recent Labs  Lab 01/17/19 1036  AST 32  ALT 31  ALKPHOS 100  BILITOT 1.6*  PROT 6.5  ALBUMIN 3.0*   No results for input(s): LIPASE, AMYLASE in the last 168 hours. No results for input(s): AMMONIA in the last 168 hours. Coagulation Profile: No results for input(s): INR, PROTIME in the last 168 hours. Cardiac Enzymes: No results for input(s): CKTOTAL, CKMB, CKMBINDEX, TROPONINI in the last 168 hours. BNP (last 3 results) Recent Labs    10/08/18 1419  PROBNP 3,397*   HbA1C: No results for input(s): HGBA1C in the last 72 hours. CBG: No results for input(s): GLUCAP in  the last 168 hours. Lipid Profile: No results for input(s): CHOL, HDL, LDLCALC, TRIG, CHOLHDL, LDLDIRECT in the last 72 hours. Thyroid Function Tests: No results for input(s): TSH, T4TOTAL, FREET4, T3FREE, THYROIDAB in the last 72 hours. Anemia Panel: No results for input(s): VITAMINB12, FOLATE, FERRITIN, TIBC, IRON, RETICCTPCT in the last 72 hours. Sepsis Labs: No results for input(s): PROCALCITON, LATICACIDVEN in the last 168 hours.  Recent Results (from the past 240 hour(s))  SARS Coronavirus 2 [facility] order, Performed in [facility] hospital lab) Nasopharyngeal Nasopharyngeal Swab     Status: None   Collection Time: 01/17/19  3:45 PM   Specimen: Nasopharyngeal Swab  Result Value Ref Range Status   SARS Coronavirus 2 NEGATIVE NEGATIVE Final    Comment: (NOTE) If result is NEGATIVE SARS-CoV-2 target nucleic acids are NOT DETECTED. The SARS-CoV-2 RNA is generally detectable in upper and lower  respiratory specimens during the acute phase of infection. The  lowest  concentration of SARS-CoV-2 viral copies this assay can detect is 250  copies / mL. A negative result does not preclude SARS-CoV-2 infection  and should not be used as the sole basis for treatment or other  patient management decisions.  A negative result may occur with  improper specimen collection / handling, submission of specimen other  than nasopharyngeal swab, presence of viral mutation(s) within the  areas targeted by this assay, and inadequate number of viral copies  (<250 copies / mL). A negative result must be combined with clinical  observations, patient history, and epidemiological information. If result is POSITIVE SARS-CoV-2 target nucleic acids are DETECTED. The SARS-CoV-2 RNA is generally detectable in upper and lower  respiratory specimens dur ing the acute phase of infection.  Positive  results are indicative of active infection with SARS-CoV-2.  Clinical  correlation with patient history and other  diagnostic information is  necessary to determine patient infection status.  Positive results do  not rule out bacterial infection or co-infection with other viruses. If result is PRESUMPTIVE POSTIVE SARS-CoV-2 nucleic acids MAY BE PRESENT.   A presumptive positive result was obtained on the submitted specimen  and confirmed on repeat testing.  While 2019 novel coronavirus  (SARS-CoV-2) nucleic acids may be present in the submitted sample  additional confirmatory testing may be necessary for epidemiological  and / or clinical management purposes  to differentiate between  SARS-CoV-2 and other Sarbecovirus currently known to infect humans.  If clinically indicated additional testing with an alternate test  methodology [phone]) is advised. The SARS-CoV-2 RNA is generally  detectable in upper and lower respiratory sp ecimens during the acute  phase of infection. The expected result is Negative. Fact Sheet for Patients:  StrictlyIdeas.no Fact Sheet for Healthcare Providers: BankingDealers.co.za This test is not yet approved or cleared by the [place] FDA and has been authorized for detection and/or diagnosis of SARS-CoV-2 by FDA under an Emergency Use Authorization (EUA).  This EUA will remain in effect (meaning this test can be used) for the duration of the COVID-19 declaration under Section 564(b)(1) of the Act, 21 U.S.C. section 360bbb-3(b)(1), unless the authorization is terminated or revoked sooner. Performed at [facility], [place] [address] [place], [place] [address]   Surgical PCR screen     Status: None   Collection Time: 01/17/19  9:56 PM   Specimen: Nasal Mucosa; Nasal Swab  Result Value Ref Range Status   MRSA, PCR NEGATIVE NEGATIVE Final   Staphylococcus aureus NEGATIVE NEGATIVE Final    Comment: (NOTE) The Xpert SA Assay (FDA approved for NASAL specimens in patients 35 years of age and older), is one component of  a comprehensive surveillance program. It is not intended to diagnose infection nor to guide or monitor treatment. Performed at [facility], [place] [address], [place], [place] [address]          Radiology Studies: No results found.      Scheduled Meds: . sodium chloride   Intravenous Once  . acetaminophen  650 mg Oral Q8H  . amiodarone  200 mg Oral Daily  . apixaban  2.5 mg Oral BID  . cholecalciferol  2,000 Units Oral BID  . docusate sodium  100 mg Oral BID  . feeding supplement (ENSURE ENLIVE)  237 mL Oral Q1500  . midodrine  5 mg Oral TID WC  . multivitamin  1 tablet Oral Daily  . pantoprazole  40 mg Oral Daily  . vitamin C  500 mg Oral Daily   Continuous Infusions: . sodium chloride Stopped (01/21/19 1050)     LOS: 4 days    Phillips Climes, MD [facility]  If 7PM-7AM, please contact night-coverage [email or web address] 01/21/2019, 2:34 PM

## 2019-01-21 NOTE — NC FL2 (Signed)
[place] MEDICAID FL2 LEVEL OF CARE SCREENING TOOL     IDENTIFICATION  Patient Name: Frank Elliott Birthdate: Feb 27, 1926 Sex: male Admission Date (Current Location): 01/17/2019  [facility] and [place] Number:  [employer] and Address:  [facility] [place], [place] [address] [place], [place] [address]      Provider Number: [id number]  Attending Physician Name and Address:  Albertine Patricia, MD  Relative Name and Phone Number:  Rashaud Frederique [phone]    Current Level of Care: Hospital Recommended Level of Care: [place] Prior Approval Number:    Date Approved/Denied:   PASRR Number: [id number]  Discharge Plan: SNF    Current Diagnoses: Patient Active Problem List   Diagnosis Date Noted  . Fracture of femoral neck, left ([place]) 01/17/2019  . Fall at home, initial encounter 01/17/2019  . Acute kidney injury superimposed on chronic kidney disease ([place]) 01/17/2019  . Combined systolic and diastolic congestive heart failure ([place]) 01/17/2019  . Persistent atrial fibrillation 01/17/2019  . Permanent atrial fibrillation 01/17/2019  . Preop cardiovascular exam   . Acute on chronic combined systolic and diastolic CHF (congestive heart failure) ([place])   . Colitis 03/05/2018  . CKD (chronic kidney disease) stage 3, GFR 30-59 ml/min (HCC) 03/05/2018  . Pacemaker 02/11/2018  . Syncope 02/03/2018  . AV block, [place] 2 02/03/2018  . Bilateral lower extremity edema 12/19/2015  . Chronic systolic heart failure ([place]) 04/25/2013  . HTN (hypertension) 04/20/2012  . Chronic diastolic CHF (congestive heart failure) ([place]) 04/18/2012  . COPD (chronic obstructive pulmonary disease) ([place]) 04/18/2012    Orientation RESPIRATION BLADDER Height & Weight     Self, Situation, Place  Normal Continent Weight: 76.4 kg Height:  5\' 9"  (175.3 cm)  BEHAVIORAL SYMPTOMS/MOOD NEUROLOGICAL BOWEL NUTRITION STATUS  (N/A) (N/A) Continent Diet  AMBULATORY STATUS  COMMUNICATION OF NEEDS Skin   Extensive Assist Verbally Surgical wounds(Left hip surgical incision)                       Personal Care Assistance Level of Assistance  Bathing, Feeding, Dressing Bathing Assistance: Maximum assistance Feeding assistance: Limited assistance Dressing Assistance: Maximum assistance     Functional Limitations Info  Sight, Hearing, Speech Sight Info: Adequate Hearing Info: Impaired Speech Info: Adequate    SPECIAL CARE FACTORS FREQUENCY  PT (By licensed PT), OT (By licensed OT)     PT Frequency: 3 times/week OT Frequency: 3 times/week            Contractures Contractures Info: Not present    Additional Factors Info  Code Status, Allergies Code Status Info: Full Code Allergies Info: Aspirin, Whiskey, Amoxicillin, Avelox, Sulfa Antibiotics, Tape, Zithromax, Penicillin, Lisinopril           Current Medications (01/21/2019):  This is the current hospital active medication list Current Facility-Administered Medications  Medication Dose Route Frequency Provider Last Rate Last Dose  . 0.9 %  sodium chloride infusion (Manually program via Guardrails IV Fluids)   Intravenous Once Ainsley Spinner, PA-C      . 0.9 %  sodium chloride infusion   Intravenous Continuous Elgergawy, Silver Huguenin, MD   Stopped at 01/21/19 1050  . acetaminophen (TYLENOL) tablet 650 mg  650 mg Oral Q8H Ainsley Spinner, PA-C   650 mg at 01/21/19 0954  . amiodarone (PACERONE) tablet 200 mg  200 mg Oral Daily Ainsley Spinner, PA-C   200 mg at 01/21/19 0954  . apixaban (ELIQUIS) tablet 2.5 mg  2.5 mg Oral BID Skeet Simmer, RPH   2.5 mg at 01/21/19 [id number]  . cholecalciferol (VITAMIN D3) tablet 2,000 Units  2,000 Units Oral BID Ainsley Spinner, PA-C   2,000 Units at 01/21/19 [id number]  . docusate sodium (COLACE) capsule 100 mg  100 mg Oral BID Ainsley Spinner, PA-C   100 mg at 01/21/19 [id number]  . feeding supplement (ENSURE ENLIVE) (ENSURE ENLIVE) liquid 237 mL  237 mL Oral Q1500 Elgergawy, Silver Huguenin, MD   237  mL at 01/20/19 1436  . hydrALAZINE (APRESOLINE) injection 10 mg  10 mg Intravenous Q4H PRN Ainsley Spinner, PA-C      . HYDROcodone-acetaminophen (NORCO/VICODIN) 5-325 MG per tablet 1-2 tablet  1-2 tablet Oral Q6H PRN Ainsley Spinner, PA-C   2 tablet at 01/18/19 1102  . ipratropium-albuterol (DUONEB) 0.5-2.5 (3) MG/3ML nebulizer solution 3 mL  3 mL Nebulization Q4H PRN Ainsley Spinner, PA-C      . metoCLOPramide (REGLAN) tablet 5-10 mg  5-10 mg Oral Q8H PRN Ainsley Spinner, PA-C       Or  . metoCLOPramide (REGLAN) injection 5-10 mg  5-10 mg Intravenous Q8H PRN Ainsley Spinner, PA-C      . midodrine (PROAMATINE) tablet 5 mg  5 mg Oral TID WC Elgergawy, Silver Huguenin, MD   5 mg at 01/21/19 1336  . morphine 2 MG/ML injection 0.5 mg  0.5 mg Intravenous Q2H PRN Ainsley Spinner, PA-C   0.5 mg at 01/18/19 [id number]  . multivitamin (PROSIGHT) tablet 1 tablet  1 tablet Oral Daily Ainsley Spinner, PA-C   1 tablet at 01/21/19 0954  . ondansetron (ZOFRAN) tablet 4 mg  4 mg Oral Q6H PRN Ainsley Spinner, PA-C       Or  . ondansetron [facility]) injection 4 mg  4 mg Intravenous Q6H PRN Ainsley Spinner, PA-C      . pantoprazole (PROTONIX) EC tablet 40 mg  40 mg Oral Daily Ainsley Spinner, PA-C   40 mg at 01/21/19 0954  . polyethylene glycol (MIRALAX / GLYCOLAX) packet 17 g  17 g Oral Daily PRN Ainsley Spinner, PA-C   17 g at 01/20/19 2331  . senna-docusate (Senokot-S) tablet 2 tablet  2 tablet Oral QHS PRN Ainsley Spinner, PA-C      . traMADol Veatrice Bourbon) tablet 50 mg  50 mg Oral Q6H PRN Ainsley Spinner, PA-C   50 mg at 01/21/19 0454  . vitamin C (ASCORBIC ACID) tablet 500 mg  500 mg Oral Daily Ainsley Spinner, PA-C   500 mg at 01/21/19 [id number]     Discharge Medications: Please see discharge summary for a list of discharge medications.  Relevant Imaging Results:  Relevant Lab Results:   Additional Information SS# [id number]  Midge Minium RN, BSN, NCM-BC, ACM-RN [phone]

## 2019-01-21 NOTE — Discharge Instructions (Signed)

## 2019-01-21 NOTE — Progress Notes (Signed)
Orthopedic Trauma Service Progress Note  Patient ID: Frank Elliott MRN: [id number] DOB/AGE: 09-25-1925 85 y.o.  Subjective:  Feeling better About to get second unit of PRBCs  Working with therapy   ROS As above  Objective:   VITALS:   Vitals:   01/21/19 0738 01/21/19 1150 01/21/19 1335 01/21/19 1500  BP: (!) 86/41 (!) 91/39 (!) 96/48 (!) 103/46  Pulse: (!) 57 (!) 50 (!) 57 (!) 59  Resp: 16 16 16 16   Temp: 97.6 F (36.4 C) 97.7 F (36.5 C) 98 F (36.7 C) (!) 97.3 F (36.3 C)  TempSrc: Oral Oral Oral Oral  SpO2: 95% 100% 97% 95%  Weight:      Height:        Estimated body mass index is 24.87 kg/m as calculated from the following:   Height as of this encounter: 5\' 9"  (1.753 m).   Weight as of this encounter: 76.4 kg.   Intake/Output      09/09 0701 - 09/10 0700 09/10 0701 - 09/11 0700   P.O. 360 960   I.V. (mL/kg) 870.2 (11.4) 295.2 (3.9)   Blood  380   IV Piggyback     Total Intake(mL/kg) 1230.2 (16.1) 1635.2 (21.4)   Urine (mL/kg/hr) 700 (0.4) 500 (0.7)   Stool  0   Blood     Total Output 700 500   Net +530.2 +1135.2        Stool Occurrence  1 x     LABS  Results for orders placed or performed during the hospital encounter of 01/17/19 (from the past 24 hour(s))  CBC     Status: Abnormal   Collection Time: 01/21/19  4:12 AM  Result Value Ref Range   WBC 11.9 (H) 4.0 - 10.5 K/uL   RBC 2.06 (L) 4.22 - 5.81 MIL/uL   Hemoglobin 8.1 (L) 13.0 - 17.0 g/dL   HCT 22.2 (L) 39.0 - 52.0 %   MCV 107.8 (H) 80.0 - 100.0 fL   MCH 39.3 (H) 26.0 - 34.0 pg   MCHC 36.5 (H) 30.0 - 36.0 g/dL   RDW 13.9 11.5 - 15.5 %   Platelets 266 150 - 400 K/uL   nRBC 0.0 0.0 - 0.2 %  Basic metabolic panel     Status: Abnormal   Collection Time: 01/21/19  4:12 AM  Result Value Ref Range   Sodium 134 (L) 135 - 145 mmol/L   Potassium 4.7 3.5 - 5.1 mmol/L   Chloride 98 98 - 111 mmol/L   CO2 24 22 - 32  mmol/L   Glucose, Bld 115 (H) 70 - 99 mg/dL   BUN 73 (H) 8 - 23 mg/dL   Creatinine, Ser 2.77 (H) 0.61 - 1.24 mg/dL   Calcium 7.6 (L) 8.9 - 10.3 mg/dL   GFR calc non Af Amer 19 (L) >60 mL/min   GFR calc Af Amer 22 (L) >60 mL/min   Anion gap 12 5 - 15  Magnesium     Status: None   Collection Time: 01/21/19  4:12 AM  Result Value Ref Range   Magnesium 2.2 1.7 - 2.4 mg/dL  Prealbumin     Status: Abnormal   Collection Time: 01/21/19  4:12 AM  Result Value Ref Range   Prealbumin 10.7 (L) 18 - 38 mg/dL  Prepare RBC  Status: None   Collection Time: 01/21/19  8:56 AM  Result Value Ref Range   Order Confirmation      ORDER PROCESSED BY BLOOD BANK Performed at [facility], [place] [address] [place], [place] [address]   Type and screen [place]     Status: None (Preliminary result)   Collection Time: 01/21/19  8:59 AM  Result Value Ref Range   ABO/RH(D) A POS    Antibody Screen NEG    Sample Expiration 01/24/2019,2359    Unit Number [id number]    Blood Component Type RED CELLS,LR    Unit division 00    Status of Unit ISSUED    Transfusion Status OK TO TRANSFUSE    Crossmatch Result      Compatible Performed at [facility], [place] [address] [place], [place] [address]    Unit Number [id number]    Blood Component Type RED CELLS,LR    Unit division 00    Status of Unit ALLOCATED    Transfusion Status OK TO TRANSFUSE    Crossmatch Result Compatible   Prepare RBC     Status: None   Collection Time: 01/21/19 11:59 AM  Result Value Ref Range   Order Confirmation      ORDER PROCESSED BY BLOOD BANK Performed at [facility], [place] [address] [place], [place] [address]      PHYSICAL EXAM:   Gen: looks better, working with PT Ext:       Left Lower Extremity   Incision looks great  Some bloody oozing noted  No signs of infection   Chronic pitting edema to L leg appear stable             Distal motor and sensory functions intact              Ext warm              + DP pulse   Assessment/Plan: 2 Days Post-Op   Principal Problem:   Fracture of femoral neck, left (HCC) Active Problems:   HTN (hypertension)   Pacemaker   Fall at home, initial encounter   Acute kidney injury superimposed on chronic kidney disease ([place])   Combined systolic and diastolic congestive heart failure (HCC)   Persistent atrial fibrillation   Permanent atrial fibrillation   Preop cardiovascular exam   Acute on chronic combined systolic and diastolic CHF (congestive heart failure) ([place])   Anti-infectives (From admission, onward)   Start     Dose/Rate Route Frequency Ordered Stop   01/19/19 1330  clindamycin (CLEOCIN) IVPB 600 mg     600 mg 100 mL/hr over 30 Minutes Intravenous Every 6 hours 01/19/19 1209 01/19/19 2112   01/19/19 0700  clindamycin (CLEOCIN) IVPB 900 mg  Status:  Discontinued     900 mg 100 mL/hr over 30 Minutes Intravenous On call to O.R. 01/18/19 1058 01/18/19 1121   01/19/19 0630  clindamycin (CLEOCIN) IVPB 600 mg     600 mg 100 mL/hr over 30 Minutes Intravenous  Once 01/18/19 1644 01/19/19 0611   01/18/19 1130  clindamycin (CLEOCIN) IVPB 600 mg  Status:  Discontinued     600 mg 100 mL/hr over 30 Minutes Intravenous On call to O.R. 01/18/19 1126 01/18/19 1644   01/18/19 0600  vancomycin (VANCOCIN) IVPB 1000 mg/200 mL premix  Status:  Discontinued     1,000 mg 200 mL/hr over 60 Minutes Intravenous On call to O.R. 01/17/19 1952 01/18/19 1126    .  POD/HD#: 2  85 y/o male s/p Left femoral neck fracture    - fall    - L femoral neck fracture s/p Left hip hemiarthroplasty              WBAT L leg             Posterior hip precautions              Abduction pillow until pt understands hip precautions              PT/OT evals             Ice and elevate for swelling control                Dressing changes as needed    - Pain management:             continue current management    - ABL anemia/Hemodynamics              worsening renal function with continued downward trend of H/H   2 units PRBCs today   Cbc in am    - Medical issues              Pert primary team      - DVT/PE prophylaxis:            eliquis    - ID:              periop abx completed    - Metabolic Bone Disease:             Vitamin d levels are deficient             fx suggestive of osteoporosis                Recommend dexa in outpatient setting in 4-8 weeks    - Activity:             Up with assistance             WBAT L leg             Posterior hip precautions left h ip    - FEN/GI prophylaxis/Foley/Lines:             Diet as tolerated   - Impediments to fracture healing:             Low energy fracture             Poor bone quality                     Vitamin d deficiency    - Dispo:             continue with inpatient care   Will likely need snf    Jari Pigg, PA-C [phone] (C) 01/21/2019, 4:44 PM  [facility] [place] [address] [phone] Domingo Sep (F)

## 2019-01-21 NOTE — Progress Notes (Signed)
Patient has not voided since 12:30 pm on 01/20/2019.  We have tried warm compressions and got patient up OOB to stand and still patient has not voided.  Bladder scan shows 487 mL of urine.  Patient has order to I/O cath and per hospital policy.

## 2019-01-22 ENCOUNTER — Inpatient Hospital Stay (HOSPITAL_COMMUNITY): Payer: Medicare Other

## 2019-01-22 DIAGNOSIS — I340 Nonrheumatic mitral (valve) insufficiency: Secondary | ICD-10-CM

## 2019-01-22 DIAGNOSIS — A419 Sepsis, unspecified organism: Secondary | ICD-10-CM

## 2019-01-22 DIAGNOSIS — G9340 Encephalopathy, unspecified: Secondary | ICD-10-CM

## 2019-01-22 DIAGNOSIS — I959 Hypotension, unspecified: Secondary | ICD-10-CM

## 2019-01-22 DIAGNOSIS — R579 Shock, unspecified: Secondary | ICD-10-CM

## 2019-01-22 DIAGNOSIS — R6521 Severe sepsis with septic shock: Secondary | ICD-10-CM

## 2019-01-22 LAB — URINALYSIS, ROUTINE W REFLEX MICROSCOPIC

## 2019-01-22 LAB — BASIC METABOLIC PANEL
Anion gap: 12 (ref 5–15)
Anion gap: 9 (ref 5–15)
BUN: 74 mg/dL — ABNORMAL HIGH (ref 8–23)
BUN: 69 mg/dL — ABNORMAL HIGH (ref 8–23)
CO2: 25 mmol/L (ref 22–32)
CO2: 24 mmol/L (ref 22–32)
Calcium: 7.8 mg/dL — ABNORMAL LOW (ref 8.9–10.3)
Calcium: 7.5 mg/dL — ABNORMAL LOW (ref 8.9–10.3)
Chloride: 99 mmol/L (ref 98–111)
Chloride: 105 mmol/L (ref 98–111)
Creatinine, Ser: 2.72 mg/dL — ABNORMAL HIGH (ref 0.61–1.24)
Creatinine, Ser: 2.27 mg/dL — ABNORMAL HIGH (ref 0.61–1.24)
GFR calc Af Amer: 23 mL/min — ABNORMAL LOW (ref >60)
GFR calc Af Amer: 28 mL/min — ABNORMAL LOW (ref >60)
GFR calc non Af Amer: 20 mL/min — ABNORMAL LOW (ref >60)
GFR calc non Af Amer: 24 mL/min — ABNORMAL LOW (ref >60)
Glucose, Bld: 96 mg/dL (ref 70–99)
Glucose, Bld: 126 mg/dL — ABNORMAL HIGH (ref 70–99)
Potassium: 4.5 mmol/L (ref 3.5–5.1)
Potassium: 4.9 mmol/L (ref 3.5–5.1)
Sodium: 136 mmol/L (ref 135–145)
Sodium: 138 mmol/L (ref 135–145)

## 2019-01-22 LAB — PREPARE RBC (CROSSMATCH)

## 2019-01-22 LAB — POCT I-STAT 7, (LYTES, BLD GAS, ICA,H+H)
Acid-Base Excess: 3 mmol/L — ABNORMAL HIGH (ref 0.0–2.0)
Bicarbonate: 26.6 mmol/L (ref 20.0–28.0)
Calcium, Ion: 1.1 mmol/L — ABNORMAL LOW (ref 1.15–1.40)
HCT: 26 % — ABNORMAL LOW (ref 39.0–52.0)
Hemoglobin: 8.8 g/dL — ABNORMAL LOW (ref 13.0–17.0)
O2 Saturation: 92 %
Patient temperature: 98.1
Potassium: 5.6 mmol/L — ABNORMAL HIGH (ref 3.5–5.1)
Sodium: 135 mmol/L (ref 135–145)
TCO2: 28 mmol/L (ref 22–32)
pCO2 arterial: 36.4 mmHg (ref 32.0–48.0)
pH, Arterial: 7.47 — ABNORMAL HIGH (ref 7.350–7.450)
pO2, Arterial: 57 mmHg — ABNORMAL LOW (ref 83.0–108.0)

## 2019-01-22 LAB — URINALYSIS, MICROSCOPIC (REFLEX): RBC / HPF: >50 RBC/hpf (ref 0–5)

## 2019-01-22 LAB — LACTIC ACID, PLASMA
Lactic Acid, Venous: 2.8 mmol/L (ref 0.5–1.9)
Lactic Acid, Venous: 2.1 mmol/L (ref 0.5–1.9)

## 2019-01-22 LAB — GLUCOSE, CAPILLARY
Glucose-Capillary: 105 mg/dL — ABNORMAL HIGH (ref 70–99)
Glucose-Capillary: 111 mg/dL — ABNORMAL HIGH (ref 70–99)
Glucose-Capillary: 111 mg/dL — ABNORMAL HIGH (ref 70–99)
Glucose-Capillary: 118 mg/dL — ABNORMAL HIGH (ref 70–99)
Glucose-Capillary: 120 mg/dL — ABNORMAL HIGH (ref 70–99)
Glucose-Capillary: 77 mg/dL (ref 70–99)
Glucose-Capillary: 94 mg/dL (ref 70–99)

## 2019-01-22 LAB — CALCITRIOL (1,25 DI-OH VIT D): Vit D, 1,25-Dihydroxy: 6.8 pg/mL — ABNORMAL LOW (ref 19.9–79.3)

## 2019-01-22 LAB — CBC
HCT: 27.4 % — ABNORMAL LOW (ref 39.0–52.0)
Hemoglobin: 10 g/dL — ABNORMAL LOW (ref 13.0–17.0)
MCH: 36 pg — ABNORMAL HIGH (ref 26.0–34.0)
MCHC: 36.5 g/dL — ABNORMAL HIGH (ref 30.0–36.0)
MCV: 98.6 fL (ref 80.0–100.0)
Platelets: 182 10*3/uL (ref 150–400)
RBC: 2.78 MIL/uL — ABNORMAL LOW (ref 4.22–5.81)
RDW: 15 % (ref 11.5–15.5)
WBC: 11.5 10*3/uL — ABNORMAL HIGH (ref 4.0–10.5)
nRBC: 0 % (ref 0.0–0.2)

## 2019-01-22 LAB — ECHOCARDIOGRAM COMPLETE
Height: 69 in
Weight: 2694.9 oz

## 2019-01-22 LAB — AMMONIA: Ammonia: 14 umol/L (ref 9–35)

## 2019-01-22 LAB — MAGNESIUM: Magnesium: 2.1 mg/dL (ref 1.7–2.4)

## 2019-01-22 LAB — PROCALCITONIN: Procalcitonin: 37.51 ng/mL

## 2019-01-22 LAB — MRSA PCR SCREENING: MRSA by PCR: NEGATIVE

## 2019-01-22 MED ORDER — DOPAMINE-DEXTROSE 3.2-5 MG/ML-% IV SOLN
0.0000 ug/kg/min | INTRAVENOUS | Status: DC
  Administered 2019-01-22: 10 ug/kg/min via INTRAVENOUS
  Administered 2019-01-22: 7 ug/kg/min via INTRAVENOUS
  Administered 2019-01-23: 4 ug/kg/min via INTRAVENOUS
  Filled 2019-01-22 (×2): qty 250

## 2019-01-22 MED ORDER — LACTATED RINGERS IV SOLN
INTRAVENOUS | Status: AC
  Administered 2019-01-22: 11:00:00 via INTRAVENOUS

## 2019-01-22 MED ORDER — SODIUM CHLORIDE 0.9 % IV BOLUS
250.0000 mL | Freq: Once | INTRAVENOUS | Status: AC
  Administered 2019-01-22: 250 mL via INTRAVENOUS

## 2019-01-22 MED ORDER — SODIUM CHLORIDE 0.9 % IV BOLUS: 500.0000 mL | Freq: Once | INTRAVENOUS | Status: DC | PRN

## 2019-01-22 MED ORDER — NALOXONE HCL 0.4 MG/ML IJ SOLN
INTRAMUSCULAR | Status: AC
  Administered 2019-01-22: 0.4 mg
  Filled 2019-01-22: qty 1

## 2019-01-22 MED ORDER — CHLORHEXIDINE GLUCONATE 0.12 % MT SOLN
15.0000 mL | Freq: Two times a day (BID) | OROMUCOSAL | Status: DC
  Administered 2019-01-22 – 2019-01-28 (×12): 15 mL via OROMUCOSAL
  Filled 2019-01-22 (×10): qty 15

## 2019-01-22 MED ORDER — DEXTROSE 5 % IV SOLN
INTRAVENOUS | Status: DC
  Administered 2019-01-22: 04:00:00 via INTRAVENOUS

## 2019-01-22 MED ORDER — ORAL CARE MOUTH RINSE
15.0000 mL | Freq: Two times a day (BID) | OROMUCOSAL | Status: DC
  Administered 2019-01-22 – 2019-01-27 (×12): 15 mL via OROMUCOSAL

## 2019-01-22 MED ORDER — SODIUM CHLORIDE 0.9 % IV BOLUS: 1000.0000 mL | Freq: Once | INTRAVENOUS | Status: DC

## 2019-01-22 MED ORDER — SODIUM CHLORIDE 0.9 % IV SOLN
1.0000 g | INTRAVENOUS | Status: DC
  Administered 2019-01-22: 11:00:00 1 g via INTRAVENOUS
  Filled 2019-01-22: qty 1
  Filled 2019-01-22: qty 10

## 2019-01-22 MED ORDER — HYDROCORTISONE NA SUCCINATE PF 100 MG IJ SOLR
100.0000 mg | INTRAMUSCULAR | Status: AC
  Administered 2019-01-22: 100 mg via INTRAVENOUS
  Filled 2019-01-22: qty 2

## 2019-01-22 MED ORDER — SODIUM CHLORIDE 0.9 % IV BOLUS: 500.0000 mL | Freq: Once | INTRAVENOUS | Status: DC

## 2019-01-22 MED ORDER — ACETAMINOPHEN 650 MG RE SUPP
650.0000 mg | RECTAL | Status: DC | PRN
  Administered 2019-01-22: 650 mg via RECTAL
  Filled 2019-01-22: qty 1

## 2019-01-22 MED ORDER — CHLORHEXIDINE GLUCONATE CLOTH 2 % EX PADS
6.0000 | MEDICATED_PAD | Freq: Every day | CUTANEOUS | Status: DC
  Administered 2019-01-22 – 2019-01-28 (×7): 6 via TOPICAL

## 2019-01-22 MED ORDER — SODIUM CHLORIDE 0.9% IV SOLUTION: Freq: Once | INTRAVENOUS | Status: DC

## 2019-01-22 MED FILL — Medication: Qty: 1 | Status: AC

## 2019-01-22 NOTE — Significant Event (Signed)
Rapid Response Event Note  Overview: Time Called: 0654 Arrival Time: 0715 Event Type: Neurologic, Hypotension  Initial Focused Assessment: Patient unresponsive, after significant stimulation he withdraws to pain. He does have a gag reflex Lung sounds rhonchi decreased on left BP 79/55 HR 60 (Vpaced)  RR 22  O2 sat 95% on RA Upon chart review patient was started on midodrine yesterday for BP 90s. Dr Waldron Labs at bedside to assess patient  Interventions: NS bolus Labs drawn ABG attempted x 4 Dopamine gtt at 10 mcg for about 15 min BP improved to 90s-40s CCM at bedside asked to turn off Dopamine BP 70s/40s Dopamine restarted at 5 mcg/kg/min BP 80/37  HR 60  RR 20  O2 sat 91% on 4L Rectal temp 99.2  Stat head CT done  Transported to 2M08, staff at bedside to receive patient.   Plan of Care (if not transferred):  Event Summary: Name of Physician Notified: Elgergawy at 0700  Name of Consulting Physician Notified: [place] at [place]  Outcome: Transferred (Comment)  Event End Time: [id number]  Raliegh Ip

## 2019-01-22 NOTE — Progress Notes (Signed)
PT Cancellation Note  Patient Details Name: Frank Elliott MRN: [id number] DOB: 1925/08/18   Cancelled Treatment:    Reason Eval/Treat Not Completed: (P) Medical issues which prohibited therapy(Pt with medical decline over night, transported to 48M from 5N.  Will inform supervising PT to follow up on Monday.)   Eliezer Khawaja J Smera Guyette 01/22/2019, 11:16 AM Governor Rooks, PTA Acute Rehabilitation Services Pager [phone] Office [phone]
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
## 2019-01-22 NOTE — Progress Notes (Signed)
  Echocardiogram 2D Echocardiogram has been performed.  [name] 01/22/2019, 4:18 PM

## 2019-01-22 NOTE — Progress Notes (Signed)
Order for 2 units PRBC entered this AM. Pts last hgb is 10. Spoke w/ CCM who state no need to transfuse at this time.

## 2019-01-22 NOTE — Progress Notes (Signed)
[facility] notified of new onset of confusion.  Patient moaning and not answering questions.  Found to have vomited at 0100 and yelling "get her up"  Patient is moving all extremities, oriented only to name.  BP (!) 109/48 (BP Location: Right Arm)   Pulse 60   Temp (!) 101 F (38.3 C) (Axillary)   Resp 20   Ht 5\' 9"  (1.753 m)   Wt 76.4 kg   SpO2 99%   BMI 24.87 kg/m  Orders given and followed, midlevel to exam patient at bedside.  Patient still having blood in urine.

## 2019-01-22 NOTE — Progress Notes (Signed)
Team called made aware of VS, patient difficult to arouse

## 2019-01-22 NOTE — Plan of Care (Signed)
Patient confused tonight, elevated temp: blood cultures, chest X-ray, CBC done.

## 2019-01-22 NOTE — Progress Notes (Signed)
Second lactic 2.1, trending down, CCM MD aware

## 2019-01-22 NOTE — Progress Notes (Signed)
PROGRESS NOTE    Frank Elliott  [id number] DOB: 1926/04/15 DOA: 01/17/2019 PCP: [name], MD   Brief Narrative:   85 year old with a history of essential hypertension, combined diastolic/systolic CHF ef [id number] grade 2 diastolic dysfunction, coronary artery disease, persistent atrial fibrillation on Eliquis, heart block status post pacemaker, BPH came with left hip pain.  Had a fall about a week prior to admission from a stationary bike.  Outpatient x-rays with PCP were negative but due to persistence of pain he came to the hospital.  CT showed left acute impacted subcapital femoral neck fracture.  Patient status post surgical repair by Dr. Marcelino Scot 01/17/2019,  he is on anticoagulation for A. fib, initially used to be on heparin drip, transitioned to Eliquis 01/19/2019, postoperative hospital course was complicated by acute blood loss anemia, where he required 2 units PRBC transfusion on 01/21/2019, as well worsening renal failure, with most recent creatinine of 2.7, felt secondary to hypotension, 9/11 a.m., patient with worsening mental status, hypotensive, however rapid response was called.   Subjective:  Patient hypotensive overnight, less responsive, remains on room air, hypotensive, rapid response called.    Assessment & Plan: 1   Principal Problem:   Fracture of femoral neck, left (HCC) Active Problems:   HTN (hypertension)   Pacemaker   Fall at home, initial encounter   Acute kidney injury superimposed on chronic kidney disease ([place])   Combined systolic and diastolic congestive heart failure (HCC)   Persistent atrial fibrillation   Permanent atrial fibrillation   Preop cardiovascular exam   Acute on chronic combined systolic and diastolic CHF (congestive heart failure) (HCC)   Hypotension -Has been progressive over the last couple days, no improvement on Midodrin and holding Coreg, he was kept on IV fluids 75 cc/h through entire hospital stay, has been stopped yesterday as he  did receive 2 units PRBC, this was started to develop edema. -Patient with worsening hypotensive this morning, symptomatic, currently minimally responsive. -Hypovolemic versus septic shock, will await repeat CBC to ensure stable hemoglobin, septic work-up was sent, chest x-ray with no acute findings, blood cultures and urine analysis is still pending.  Patient is febrile this morning. -We will check lactic acid, procalcitonin, CBC, BMP, ammonia. -We will give 1 dose of IV hydrocortisone. -Started on dopamine for low blood pressure.  Acute encephalopathy -Patient is awake alert oriented, pleasant and conversant at baseline, but this a.m. he is minimally responsive to painful stimuli. -We will check ABG -Grossly moving bilateral upper extremity on painful stimuli. -We will need CT head when more stable, I will hold Eliquis for now. -We will obtain ABG, ammonia level  Acute impacted subcapital fracture of the left femoral neck, -Outpatient x-rays negative.  Confirmed fracture on the CT -Pain control, incentive spirometer, bowel regimen - Patient underwent operative management on 9/8 by Dr Marcelino Scot. -Seen by physical therapy with recommendations for SNF   Chronic combined congestive heart failure with reduced ejection fraction, EF [id number], grade 2 diastolic dysfunction, class III -  Cardiology consulted.  Strict input and output.  Daily weight -Lasix remains on hold given worsening renal function, and soft blood pressure -Beta-blockers on hold giving low blood pressure.  Acute kidney injury on CKD stage III - Baseline creatinine 1.3, elevated at admission to 1.6, continues to progressively worsen, peaked at 2.7, morning labs are pending . -This is most likely in the setting of low blood pressure contributing to renal failure.  Acute blood loss anemia -Baseline hemoglobin around 13, did drop  to 8, he received 2 units PRBC 9/10 .  Persistent atrial fibrillation on chronic anticoagulation -Eliquis  was on hold for surgery, heparin drip initially, currently back on Eliquis(will hold today) -Continue with amiodarone for heart rate control, I have stopped Coreg given soft blood pressure  Essential hypertension -Pressure remains soft, continue to hold losartan especially with AKI, I have stopped Coreg  with worsening renal function and low blood pressure,  -Patient blood pressure is low, started on Midodrin.  Coronary artery disease -Currently chest pain-free.  Continue home meds  History of heart block status post [facility] Jude dual-chamber pacemaker -Cardiology is seeing the patient.  GERD -PPI  Hematuria -Will hold Eliquis   Goals of care: -I have called his son [name] this morning, and updated him about deterioration in patient's status, discussed CODE STATUS with son, who reported he did not wish for CPR to be done, so patient was changed to DNR CODE STATUS, but he would like to continue with current interventions and medical management .  DVT prophylaxis: Heparin drip>>Eliquis Code Status: DNR Family Communication: Discussed with son via phone today Disposition Plan: We will transfer to ICU  Consultants:   Orthopedic  Cardiology  Procedures:   None so far  Antimicrobials:   None   Objective: Vitals:   01/22/19 0647 01/22/19 0649 01/22/19 0710 01/22/19 0727  BP: (!) 81/44 (!) 79/55 (!) 82/41 (!) 78/42  Pulse: (!) 47 62 (!) 58 60  Resp: 15  20   Temp: 98.9 F (37.2 C)  98.1 F (36.7 C)   TempSrc: Axillary  Oral   SpO2: 95%  95%   Weight:      Height:        Intake/Output Summary (Last 24 hours) at 01/22/2019 0742 Last data filed at 01/22/2019 0659 Gross per 24 hour  Intake 2677.2 ml  Output 1150 ml  Net 1527.2 ml   Filed Weights   01/18/19 0200 01/19/19 0543 01/21/19 0500  Weight: 73 kg 72.9 kg 76.4 kg    Examination:  Patient is obtundent, only grimacing to painful stimuli, grossly able to move bilateral upper extremity to pain, pupils equal  bilaterally, reactive but sluggish Symmetrical Chest wall movement, Good air movement bilaterally, CTAB RRR,No Gallops,Rubs or new Murmurs, No Parasternal Heave +ve B.Sounds, Abd Soft,No rebound - guarding or rigidity. No Cyanosis, Clubbing, +1 edema, No new Rash or bruise  , left hip surgical site bandaged, no blood can be seen through gauze.  Hematuria can be seen through his condom cath.    Data Reviewed:   CBC: Recent Labs  Lab 01/17/19 1036 01/18/19 0434 01/18/19 0824 01/19/19 0355 01/20/19 0525 01/21/19 0412  WBC 7.5 6.7  --  7.0 11.0* 11.9*  NEUTROABS 5.1  --   --   --   --   --   HGB 13.6 12.7*  --  12.8* 9.6* 8.1*  HCT 40.1 37.7* 37.3* 36.0* 28.2* 22.2*  MCV 104.7* 100.8*  --  102.9* 101.8* 107.8*  PLT 278 279  --  301 325 [id number]   Basic Metabolic Panel: Recent Labs  Lab 01/17/19 1036 01/18/19 0434 01/19/19 0355 01/20/19 0525 01/21/19 0412  NA 139 137 136 137 134*  K 5.0 4.4 4.1 4.8 4.7  CL 103 99 98 101 98  CO2 26 26 29 27 24   GLUCOSE 100* 111* 119* 129* 115*  BUN 48* 45* 44* 56* 73*  CREATININE 1.64* 1.73* 1.84* 2.11* 2.77*  CALCIUM 9.0 8.4* 8.5* 8.0* 7.6*  MG  --   --  2.1 2.2 2.2   GFR: Estimated Creatinine Clearance: 17.7 mL/min (A) (by C-G formula based on SCr of 2.77 mg/dL (H)). Liver Function Tests: Recent Labs  Lab 01/17/19 1036  AST 32  ALT 31  ALKPHOS 100  BILITOT 1.6*  PROT 6.5  ALBUMIN 3.0*   No results for input(s): LIPASE, AMYLASE in the last 168 hours. No results for input(s): AMMONIA in the last 168 hours. Coagulation Profile: No results for input(s): INR, PROTIME in the last 168 hours. Cardiac Enzymes: No results for input(s): CKTOTAL, CKMB, CKMBINDEX, TROPONINI in the last 168 hours. BNP (last 3 results) Recent Labs    10/08/18 1419  PROBNP 3,397*   HbA1C: No results for input(s): HGBA1C in the last 72 hours. CBG: Recent Labs  Lab 01/22/19 0414 01/22/19 0732  GLUCAP 77 94   Lipid Profile: No results for input(s):  CHOL, HDL, LDLCALC, TRIG, CHOLHDL, LDLDIRECT in the last 72 hours. Thyroid Function Tests: No results for input(s): TSH, T4TOTAL, FREET4, T3FREE, THYROIDAB in the last 72 hours. Anemia Panel: No results for input(s): VITAMINB12, FOLATE, FERRITIN, TIBC, IRON, RETICCTPCT in the last 72 hours. Sepsis Labs: No results for input(s): PROCALCITON, LATICACIDVEN in the last 168 hours.  Recent Results (from the past 240 hour(s))  SARS Coronavirus 2 [facility] order, Performed in [facility] hospital lab) Nasopharyngeal Nasopharyngeal Swab     Status: None   Collection Time: 01/17/19  3:45 PM   Specimen: Nasopharyngeal Swab  Result Value Ref Range Status   SARS Coronavirus 2 NEGATIVE NEGATIVE Final    Comment: (NOTE) If result is NEGATIVE SARS-CoV-2 target nucleic acids are NOT DETECTED. The SARS-CoV-2 RNA is generally detectable in upper and lower  respiratory specimens during the acute phase of infection. The lowest  concentration of SARS-CoV-2 viral copies this assay can detect is 250  copies / mL. A negative result does not preclude SARS-CoV-2 infection  and should not be used as the sole basis for treatment or other  patient management decisions.  A negative result may occur with  improper specimen collection / handling, submission of specimen other  than nasopharyngeal swab, presence of viral mutation(s) within the  areas targeted by this assay, and inadequate number of viral copies  (<250 copies / mL). A negative result must be combined with clinical  observations, patient history, and epidemiological information. If result is POSITIVE SARS-CoV-2 target nucleic acids are DETECTED. The SARS-CoV-2 RNA is generally detectable in upper and lower  respiratory specimens dur ing the acute phase of infection.  Positive  results are indicative of active infection with SARS-CoV-2.  Clinical  correlation with patient history and other diagnostic information is  necessary to determine patient  infection status.  Positive results do  not rule out bacterial infection or co-infection with other viruses. If result is PRESUMPTIVE POSTIVE SARS-CoV-2 nucleic acids MAY BE PRESENT.   A presumptive positive result was obtained on the submitted specimen  and confirmed on repeat testing.  While 2019 novel coronavirus  (SARS-CoV-2) nucleic acids may be present in the submitted sample  additional confirmatory testing may be necessary for epidemiological  and / or clinical management purposes  to differentiate between  SARS-CoV-2 and other Sarbecovirus currently known to infect humans.  If clinically indicated additional testing with an alternate test  methodology [phone]) is advised. The SARS-CoV-2 RNA is generally  detectable in upper and lower respiratory sp ecimens during the acute  phase of infection. The expected result is Negative. Fact Sheet for Patients:  StrictlyIdeas.no Fact  Sheet for Healthcare Providers: BankingDealers.co.za This test is not yet approved or cleared by the [place] and has been authorized for detection and/or diagnosis of SARS-CoV-2 by FDA under an Emergency Use Authorization (EUA).  This EUA will remain in effect (meaning this test can be used) for the duration of the COVID-19 declaration under Section 564(b)(1) of the Act, 21 U.S.C. section 360bbb-3(b)(1), unless the authorization is terminated or revoked sooner. Performed at [facility], [place] [address], [place], [place] [address]   Surgical PCR screen     Status: None   Collection Time: 01/17/19  9:56 PM   Specimen: Nasal Mucosa; Nasal Swab  Result Value Ref Range Status   MRSA, PCR NEGATIVE NEGATIVE Final   Staphylococcus aureus NEGATIVE NEGATIVE Final    Comment: (NOTE) The Xpert SA Assay (FDA approved for NASAL specimens in patients 69 years of age and older), is one component of a comprehensive surveillance program. It is not intended  to diagnose infection nor to guide or monitor treatment. Performed at [facility], [place] [address] [place], [place] [address]          Radiology Studies: Dg Chest Port 1 View  Result Date: 01/22/2019 CLINICAL DATA:  Fever EXAM: PORTABLE CHEST 1 VIEW COMPARISON:  01/17/2019 FINDINGS: Left pacer remains in place, unchanged. Heart is normal size. No confluent airspace opacities or effusions. No acute bony abnormality. IMPRESSION: No acute cardiopulmonary disease. Electronically Signed   By: Rolm Baptise M.D.   On: 01/22/2019 03:12        Scheduled Meds: . acetaminophen  650 mg Oral Q8H  . amiodarone  200 mg Oral Daily  . apixaban  2.5 mg Oral BID  . cholecalciferol  2,000 Units Oral BID  . docusate sodium  100 mg Oral BID  . feeding supplement (ENSURE ENLIVE)  237 mL Oral Q1500  . hydrocortisone sod succinate (SOLU-CORTEF) inj  100 mg Intravenous STAT  . midodrine  5 mg Oral TID WC  . multivitamin  1 tablet Oral Daily  . pantoprazole  40 mg Oral Daily  . vitamin C  500 mg Oral Daily   Continuous Infusions: . dextrose 75 mL/hr at 01/22/19 0659  . DOPamine    . sodium chloride    . sodium chloride       LOS: 5 days    Phillips Climes, MD [facility]  If 7PM-7AM, please contact night-coverage [email or web address] 01/22/2019, 7:42 AM

## 2019-01-22 NOTE — Progress Notes (Signed)
RT came to patient room to obtain ABG however unable to obtain sample.  CCM NP attempted ABG but unable to obtain.  Per CCM MD, will hold on sticking until gets to ICU then MD will obtain sample.  Patient currently on 6L nasal cannula.

## 2019-01-22 NOTE — Progress Notes (Signed)
MD aware, temp down from 101, will continue to monitor

## 2019-01-22 NOTE — Procedures (Signed)
Arterial Catheter Insertion Procedure Note Frank Elliott [id number] 10/06/1925  Procedure: Insertion of Arterial Catheter  Indications: Blood pressure monitoring and Frequent blood sampling  Procedure Details Consent: Risks of procedure as well as the alternatives and risks of each were explained to the (patient/caregiver).  Consent for procedure obtained. Time Out: Verified patient identification, verified procedure, site/side was marked, verified correct patient position, special equipment/implants available, medications/allergies/relevent history reviewed, required imaging and test results available.  Performed  Maximum sterile technique was used including antiseptics, cap, gloves, gown, hand hygiene, mask and sheet. Skin prep: Chlorhexidine; local anesthetic administered 20 gauge catheter was inserted into left radial artery using the Seldinger technique. ULTRASOUND GUIDANCE USED: NO Evaluation Blood flow good; BP tracing good. Complications: No apparent complications.   Frank Elliott 01/22/2019

## 2019-01-22 NOTE — Progress Notes (Addendum)
Bedside RN with concerns for AMS. According to RN when she got on shift at 2300 pt was unresponsive to verbal stimuli. This is a change from previous assessments in which pt was responsive, alert and oriented. On assessment, pt opens eyes and verbally responds to his name. He is, however, confused and falls back to sleep easily. He is a/ox2. The rest of the neuro exam is unremarkable. RN states that this is an improvement from the start of her shift. Will continue to monitor.  AMS/Lethargy -It is noted that patient is on several different narcotics. Will hold these while pt is lethargic -CBC -BMP -Check CBG -Continue with neuro checks q4hrs -NPO until pt is more alert. Changed MIVF to D5W for now.   Fever -250 cc bolus NS given -Tylenol ordered - Blood cultures x2  -Chest xray  Lovey Newcomer, NP [facility] 7p-7a [phone]

## 2019-01-23 DIAGNOSIS — R7881 Bacteremia: Secondary | ICD-10-CM

## 2019-01-23 LAB — BLOOD CULTURE ID PANEL (REFLEXED)

## 2019-01-23 LAB — CBC
HCT: 25.9 % — ABNORMAL LOW (ref 39.0–52.0)
Hemoglobin: 9.1 g/dL — ABNORMAL LOW (ref 13.0–17.0)
MCH: 34.2 pg — ABNORMAL HIGH (ref 26.0–34.0)
MCHC: 35.1 g/dL (ref 30.0–36.0)
MCV: 97.4 fL (ref 80.0–100.0)
Platelets: 223 10*3/uL (ref 150–400)
RBC: 2.66 MIL/uL — ABNORMAL LOW (ref 4.22–5.81)
RDW: 14.9 % (ref 11.5–15.5)
WBC: 21.4 10*3/uL — ABNORMAL HIGH (ref 4.0–10.5)
nRBC: 0 % (ref 0.0–0.2)

## 2019-01-23 LAB — BASIC METABOLIC PANEL
Anion gap: 9 (ref 5–15)
BUN: 73 mg/dL — ABNORMAL HIGH (ref 8–23)
CO2: 24 mmol/L (ref 22–32)
Calcium: 7.9 mg/dL — ABNORMAL LOW (ref 8.9–10.3)
Chloride: 103 mmol/L (ref 98–111)
Creatinine, Ser: 2.28 mg/dL — ABNORMAL HIGH (ref 0.61–1.24)
GFR calc Af Amer: 28 mL/min — ABNORMAL LOW (ref >60)
GFR calc non Af Amer: 24 mL/min — ABNORMAL LOW (ref >60)
Glucose, Bld: 114 mg/dL — ABNORMAL HIGH (ref 70–99)
Potassium: 4.4 mmol/L (ref 3.5–5.1)
Sodium: 136 mmol/L (ref 135–145)

## 2019-01-23 LAB — POCT I-STAT 7, (LYTES, BLD GAS, ICA,H+H)
Acid-Base Excess: 2 mmol/L (ref 0.0–2.0)
Bicarbonate: 27.2 mmol/L (ref 20.0–28.0)
Calcium, Ion: 1.23 mmol/L (ref 1.15–1.40)
HCT: 25 % — ABNORMAL LOW (ref 39.0–52.0)
Hemoglobin: 8.5 g/dL — ABNORMAL LOW (ref 13.0–17.0)
O2 Saturation: 95 %
Potassium: 4.4 mmol/L (ref 3.5–5.1)
Sodium: 138 mmol/L (ref 135–145)
TCO2: 28 mmol/L (ref 22–32)
pCO2 arterial: 42.4 mmHg (ref 32.0–48.0)
pH, Arterial: 7.415 (ref 7.350–7.450)
pO2, Arterial: 73 mmHg — ABNORMAL LOW (ref 83.0–108.0)

## 2019-01-23 LAB — GLUCOSE, CAPILLARY
Glucose-Capillary: 111 mg/dL — ABNORMAL HIGH (ref 70–99)
Glucose-Capillary: 105 mg/dL — ABNORMAL HIGH (ref 70–99)
Glucose-Capillary: 104 mg/dL — ABNORMAL HIGH (ref 70–99)
Glucose-Capillary: 124 mg/dL — ABNORMAL HIGH (ref 70–99)

## 2019-01-23 LAB — MAGNESIUM: Magnesium: 2.3 mg/dL (ref 1.7–2.4)

## 2019-01-23 LAB — PHOSPHORUS: Phosphorus: 3.7 mg/dL (ref 2.5–4.6)

## 2019-01-23 LAB — PROCALCITONIN: Procalcitonin: 57.02 ng/mL

## 2019-01-23 MED ORDER — SODIUM CHLORIDE 0.9 % IV SOLN
2.0000 g | Freq: Every day | INTRAVENOUS | Status: DC
  Administered 2019-01-23 – 2019-01-24 (×3): 2 g via INTRAVENOUS
  Filled 2019-01-23 (×3): qty 20

## 2019-01-23 MED ORDER — APIXABAN 2.5 MG PO TABS
2.5000 mg | ORAL_TABLET | Freq: Two times a day (BID) | ORAL | Status: DC
  Administered 2019-01-23 – 2019-01-24 (×3): 2.5 mg via ORAL
  Filled 2019-01-23 (×4): qty 1

## 2019-01-23 NOTE — Progress Notes (Signed)
PHARMACY - PHYSICIAN COMMUNICATION CRITICAL VALUE ALERT - BLOOD CULTURE IDENTIFICATION (BCID)  Frank Elliott is an 85 y.o. male who presented to [facility] on 01/17/2019 with L hip fracture s/p repair, now with fevers and hypotension  Assessment:  1/2 blood cultures growing Klebsiella Pneumoniae  Name of physician (or Provider) Contacted: X Blount  Current antibiotics: Rocephin  Changes to prescribed antibiotics recommended:  Change Rocephin 2 g IV q24h  Results for orders placed or performed during the hospital encounter of 01/17/19  Blood Culture ID Panel (Reflexed) (Collected: 01/22/2019  6:45 AM)  Result Value Ref Range   Enterococcus species NOT DETECTED NOT DETECTED   Listeria monocytogenes NOT DETECTED NOT DETECTED   Staphylococcus species NOT DETECTED NOT DETECTED   Staphylococcus aureus (BCID) NOT DETECTED NOT DETECTED   Streptococcus species NOT DETECTED NOT DETECTED   Streptococcus agalactiae NOT DETECTED NOT DETECTED   Streptococcus pneumoniae NOT DETECTED NOT DETECTED   Streptococcus pyogenes NOT DETECTED NOT DETECTED   Acinetobacter baumannii NOT DETECTED NOT DETECTED   Enterobacteriaceae species DETECTED (A) NOT DETECTED   Enterobacter cloacae complex NOT DETECTED NOT DETECTED   Escherichia coli NOT DETECTED NOT DETECTED   Klebsiella oxytoca NOT DETECTED NOT DETECTED   Klebsiella pneumoniae DETECTED (A) NOT DETECTED   Proteus species NOT DETECTED NOT DETECTED   Serratia marcescens NOT DETECTED NOT DETECTED   Carbapenem resistance NOT DETECTED NOT DETECTED   Haemophilus influenzae NOT DETECTED NOT DETECTED   Neisseria meningitidis NOT DETECTED NOT DETECTED   Pseudomonas aeruginosa NOT DETECTED NOT DETECTED   Candida albicans NOT DETECTED NOT DETECTED   Candida glabrata NOT DETECTED NOT DETECTED   Candida krusei NOT DETECTED NOT DETECTED   Candida parapsilosis NOT DETECTED NOT DETECTED   Candida tropicalis NOT DETECTED NOT DETECTED    Caryl Pina 01/23/2019  1:12 AM

## 2019-01-23 NOTE — Progress Notes (Signed)
PROGRESS NOTE    Frank Elliott  [id number] DOB: [birth year] DOA: 01/17/2019 PCP: [name], MD   Brief Narrative:   85 year old with a history of essential hypertension, combined diastolic/systolic CHF ef [id number] grade 2 diastolic dysfunction, coronary artery disease, persistent atrial fibrillation on Eliquis, heart block status post pacemaker, BPH came with left hip pain.  Had a fall about a week prior to admission from a stationary bike.  Outpatient x-rays with PCP were negative but due to persistence of pain he came to the hospital.  CT showed left acute impacted subcapital femoral neck fracture.  Patient status post surgical repair by Dr. Marcelino Scot 01/17/2019,  he is on anticoagulation for A. fib, initially used to be on heparin drip, transitioned to Eliquis 01/19/2019, postoperative hospital course was complicated by acute blood loss anemia, where he required 2 units PRBC transfusion on 01/21/2019, as well worsening renal failure, with most recent creatinine of 2.7, felt secondary to hypotension, 9/11 a.m., patient with worsening mental status, hypotensive, transferred to ICU for pressors.  Subjective:  Patient remains on dopamine 12 this morning, he himself denies any complaints, no significant events per staff.    Assessment & Plan: 1   Principal Problem:   Fracture of femoral neck, left (HCC) Active Problems:   HTN (hypertension)   Pacemaker   Fall at home, initial encounter   Acute kidney injury superimposed on chronic kidney disease ([facility])   Combined systolic and diastolic congestive heart failure (HCC)   Persistent atrial fibrillation   Permanent atrial fibrillation   Preop cardiovascular exam   Acute on chronic combined systolic and diastolic CHF (congestive heart failure) ([facility])   Septic shock due to UTI/bacteremia -9/11, patient hypotensive, altered(unresponsive) with elevated lactic acid, leukocytosis, and significantly elevated procalcitonin at 57. -On dopamine drip  initially, currently being tapered off. -recieved IV hydrocortisone -Most likely related to the patella UTI/bacteremia -Continue to monitor in ICU, continue with IV fluids -Continue with IV Rocephin, monitor sensitivities and susceptibilities   Acute metabolic encephalopathy -CT head with no acute finding, this is most likely in the setting of sepsis, appears to be improving today, more communicative and awake.  Acute impacted subcapital fracture of the left femoral neck, -Outpatient x-rays negative.  Confirmed fracture on the CT -Pain control, incentive spirometer, bowel regimen - Patient underwent operative management on 9/8 by Dr Marcelino Scot. -Seen by physical therapy with recommendations for SNF   Chronic combined congestive heart failure with reduced ejection fraction, EF [id number], grade 2 diastolic dysfunction, class III -  Cardiology consulted.  Strict input and output.  Daily weight -He appears to be volume overloaded today, but I will hold on diuresis given sepsis, and soft blood pressure, pressor requirements.  Acute kidney injury on CKD stage III - Baseline creatinine 1.3, elevated at admission to 1.6, 2.7, most likely in the setting of ATN due to sepsis and hypotension . -Improved today at 2.2, avoid nephrotoxic medications .  Acute blood loss anemia -Baseline hemoglobin around 13, did drop to 8, he received 2 units PRBC 9/10 .  Persistent atrial fibrillation on chronic anticoagulation -Eliquis was on hold for surgery, heparin drip initially, currently back on Eliquis -Continue with amiodarone for heart rate control, I have stopped Coreg given soft blood pressure  Essential hypertension -continue to hold losartan especially with AKI, continue to hold Coreg given low blood pressure .  Coronary artery disease -Currently chest pain-free.  Continue home meds  History of heart block status post [facility] dual-chamber pacemaker -Cardiology  is seeing the  patient.  GERD -PPI   DVT prophylaxis: Heparin drip>>Eliquis Code Status: DNR Disposition Plan: We will transfer to ICU  Consultants:   Orthopedic  Cardiology  Procedures:   None so far  Antimicrobials:   None   Objective: Vitals:   01/23/19 1100 01/23/19 1200 01/23/19 1300 01/23/19 1400  BP: (!) 114/55 (!) 95/48 (!) 115/53 115/60  Pulse: 60 60 (!) 58 60  Resp: 10 16 11  (!) 8  Temp: (!) 97.4 F (36.3 C)     TempSrc: Oral     SpO2: 100% 100% 100% 98%  Weight:      Height:        Intake/Output Summary (Last 24 hours) at 01/23/2019 1438 Last data filed at 01/23/2019 1400 Gross per 24 hour  Intake 1155.35 ml  Output 875 ml  Net 280.35 ml   Filed Weights   01/19/19 0543 01/21/19 0500 01/23/19 0500  Weight: 72.9 kg 76.4 kg 79.2 kg    Examination:  Awake Alert, responsive, confused, ill-appearing  symmetrical Chest wall movement, Good air movement bilaterally, no rales or rhonchi RRR,No Gallops,Rubs or new Murmurs, No Parasternal Heave +ve B.Sounds, Abd Soft, No tenderness, No rebound - guarding or rigidity. No Cyanosis, Clubbing or edema, No new Rash or bruise     Data Reviewed:   CBC: Recent Labs  Lab 01/17/19 1036  01/19/19 0355 01/20/19 0525 01/21/19 0412 01/22/19 0808 01/22/19 1047 01/23/19 0424 01/23/19 0514  WBC 7.5   < > 7.0 11.0* 11.9* 11.5*  --  21.4*  --   NEUTROABS 5.1  --   --   --   --   --   --   --   --   HGB 13.6   < > 12.8* 9.6* 8.1* 10.0* 8.8* 9.1* 8.5*  HCT 40.1   < > 36.0* 28.2* 22.2* 27.4* 26.0* 25.9* 25.0*  MCV 104.7*   < > 102.9* 101.8* 107.8* 98.6  --  97.4  --   PLT 278   < > 301 325 266 182  --  223  --    < > = values in this interval not displayed.   Basic Metabolic Panel: Recent Labs  Lab 01/19/19 0355 01/20/19 0525 01/21/19 0412 01/22/19 0646 01/22/19 1047 01/22/19 1634 01/23/19 0424 01/23/19 0514  NA 136 137 134* 136 135 138 136 138  K 4.1 4.8 4.7 4.5 5.6* 4.9 4.4 4.4  CL 98 101 98 99  --  105 103  --    CO2 29 27 24 25   --  24 24  --   GLUCOSE 119* 129* 115* 96  --  126* 114*  --   BUN 44* 56* 73* 74*  --  69* 73*  --   CREATININE 1.84* 2.11* 2.77* 2.72*  --  2.27* 2.28*  --   CALCIUM 8.5* 8.0* 7.6* 7.8*  --  7.5* 7.9*  --   MG 2.1 2.2 2.2 2.1  --   --  2.3  --   PHOS  --   --   --   --   --   --  3.7  --    GFR: Estimated Creatinine Clearance: 21.5 mL/min (A) (by C-G formula based on SCr of 2.28 mg/dL (H)). Liver Function Tests: Recent Labs  Lab 01/17/19 1036  AST 32  ALT 31  ALKPHOS 100  BILITOT 1.6*  PROT 6.5  ALBUMIN 3.0*   No results for input(s): LIPASE, AMYLASE in the last 168 hours. Recent  Labs  Lab 01/22/19 0746  AMMONIA 14   Coagulation Profile: No results for input(s): INR, PROTIME in the last 168 hours. Cardiac Enzymes: No results for input(s): CKTOTAL, CKMB, CKMBINDEX, TROPONINI in the last 168 hours. BNP (last 3 results) Recent Labs    10/08/18 1419  PROBNP 3,397*   HbA1C: No results for input(s): HGBA1C in the last 72 hours. CBG: Recent Labs  Lab 01/22/19 1949 01/22/19 2343 01/23/19 0435 01/23/19 0735 01/23/19 1143  GLUCAP 120* 111* 111* 105* 104*   Lipid Profile: No results for input(s): CHOL, HDL, LDLCALC, TRIG, CHOLHDL, LDLDIRECT in the last 72 hours. Thyroid Function Tests: No results for input(s): TSH, T4TOTAL, FREET4, T3FREE, THYROIDAB in the last 72 hours. Anemia Panel: No results for input(s): VITAMINB12, FOLATE, FERRITIN, TIBC, IRON, RETICCTPCT in the last 72 hours. Sepsis Labs: Recent Labs  Lab 01/22/19 0744 01/22/19 1045 01/23/19 0424  PROCALCITON 37.51  --  57.02  LATICACIDVEN 2.8* 2.1*  --     Recent Results (from the past 240 hour(s))  SARS Coronavirus 2 [facility] order, Performed in [facility] hospital lab) Nasopharyngeal Nasopharyngeal Swab     Status: None   Collection Time: 01/17/19  3:45 PM   Specimen: Nasopharyngeal Swab  Result Value Ref Range Status   SARS Coronavirus 2 NEGATIVE NEGATIVE Final    Comment:  (NOTE) If result is NEGATIVE SARS-CoV-2 target nucleic acids are NOT DETECTED. The SARS-CoV-2 RNA is generally detectable in upper and lower  respiratory specimens during the acute phase of infection. The lowest  concentration of SARS-CoV-2 viral copies this assay can detect is 250  copies / mL. A negative result does not preclude SARS-CoV-2 infection  and should not be used as the sole basis for treatment or other  patient management decisions.  A negative result may occur with  improper specimen collection / handling, submission of specimen other  than nasopharyngeal swab, presence of viral mutation(s) within the  areas targeted by this assay, and inadequate number of viral copies  (<250 copies / mL). A negative result must be combined with clinical  observations, patient history, and epidemiological information. If result is POSITIVE SARS-CoV-2 target nucleic acids are DETECTED. The SARS-CoV-2 RNA is generally detectable in upper and lower  respiratory specimens dur ing the acute phase of infection.  Positive  results are indicative of active infection with SARS-CoV-2.  Clinical  correlation with patient history and other diagnostic information is  necessary to determine patient infection status.  Positive results do  not rule out bacterial infection or co-infection with other viruses. If result is PRESUMPTIVE POSTIVE SARS-CoV-2 nucleic acids MAY BE PRESENT.   A presumptive positive result was obtained on the submitted specimen  and confirmed on repeat testing.  While 2019 novel coronavirus  (SARS-CoV-2) nucleic acids may be present in the submitted sample  additional confirmatory testing may be necessary for epidemiological  and / or clinical management purposes  to differentiate between  SARS-CoV-2 and other Sarbecovirus currently known to infect humans.  If clinically indicated additional testing with an alternate test  methodology [phone]) is advised. The SARS-CoV-2 RNA is  generally  detectable in upper and lower respiratory sp ecimens during the acute  phase of infection. The expected result is Negative. Fact Sheet for Patients:  StrictlyIdeas.no Fact Sheet for Healthcare Providers: BankingDealers.co.za This test is not yet approved or cleared by the [place] FDA and has been authorized for detection and/or diagnosis of SARS-CoV-2 by FDA under an Emergency Use Authorization (EUA).  This  EUA will remain in effect (meaning this test can be used) for the duration of the COVID-19 declaration under Section 564(b)(1) of the Act, 21 U.S.C. section 360bbb-3(b)(1), unless the authorization is terminated or revoked sooner. Performed at [facility], [place] [address] [place], [place] [address]   Surgical PCR screen     Status: None   Collection Time: 01/17/19  9:56 PM   Specimen: Nasal Mucosa; Nasal Swab  Result Value Ref Range Status   MRSA, PCR NEGATIVE NEGATIVE Final   Staphylococcus aureus NEGATIVE NEGATIVE Final    Comment: (NOTE) The Xpert SA Assay (FDA approved for NASAL specimens in patients 77 years of age and older), is one component of a comprehensive surveillance program. It is not intended to diagnose infection nor to guide or monitor treatment. Performed at [facility], [place] [address], [place], [place] [address]   Culture, blood (routine x 2)     Status: None (Preliminary result)   Collection Time: 01/22/19  5:35 AM   Specimen: BLOOD  Result Value Ref Range Status   Specimen Description BLOOD RIGHT HAND  Final   Special Requests   Final    BOTTLES DRAWN AEROBIC ONLY Blood Culture results may not be optimal due to an inadequate volume of blood received in culture bottles   Culture   Final    NO GROWTH 1 DAY Performed at [facility], [place] [address], [place], [place] [address]    Report Status PENDING  Incomplete  Culture, blood (routine x 2)     Status: Abnormal  (Preliminary result)   Collection Time: 01/22/19  6:45 AM   Specimen: BLOOD LEFT ARM  Result Value Ref Range Status   Specimen Description BLOOD LEFT ARM  Final   Special Requests   Final    BOTTLES DRAWN AEROBIC ONLY Blood Culture adequate volume   Culture  Setup Time   Final    GRAM NEGATIVE RODS AEROBIC BOTTLE ONLY CRITICAL RESULT CALLED TO, READ BACK BY AND VERIFIED WITH: G. ABBOTT,PHARMD 0106 01/23/2019 T. TYSOR    Culture (A)  Final    KLEBSIELLA PNEUMONIAE SUSCEPTIBILITIES TO FOLLOW CULTURE REINCUBATED FOR BETTER GROWTH Performed at [facility], [place] [address] [place],  [address]    Report Status PENDING  Incomplete  Blood Culture ID Panel (Reflexed)     Status: Abnormal   Collection Time: 01/22/19  6:45 AM  Result Value Ref Range Status   Enterococcus species NOT DETECTED NOT DETECTED Final   Listeria monocytogenes NOT DETECTED NOT DETECTED Final   Staphylococcus species NOT DETECTED NOT DETECTED Final   Staphylococcus aureus (BCID) NOT DETECTED NOT DETECTED Final   Streptococcus species NOT DETECTED NOT DETECTED Final   Streptococcus agalactiae NOT DETECTED NOT DETECTED Final   Streptococcus pneumoniae NOT DETECTED NOT DETECTED Final   Streptococcus pyogenes NOT DETECTED NOT DETECTED Final   Acinetobacter baumannii NOT DETECTED NOT DETECTED Final   Enterobacteriaceae species DETECTED (A) NOT DETECTED Final    Comment: Enterobacteriaceae represent a large family of gram-negative bacteria, not a single organism. CRITICAL RESULT CALLED TO, READ BACK BY AND VERIFIED WITH: G. ABBOTT,PHARMD 0106 01/23/2019 T. TYSOR    Enterobacter cloacae complex NOT DETECTED NOT DETECTED Final   Escherichia coli NOT DETECTED NOT DETECTED Final   Klebsiella oxytoca NOT DETECTED NOT DETECTED Final   Klebsiella pneumoniae DETECTED (A) NOT DETECTED Final    Comment: CRITICAL RESULT CALLED TO, READ BACK BY AND VERIFIED WITH: G. ABBOTT,PHARMD 0106  01/23/2019 T. TYSOR    Proteus  species NOT DETECTED NOT DETECTED Final   Serratia marcescens NOT DETECTED NOT DETECTED Final   Carbapenem resistance NOT DETECTED NOT DETECTED Final   Haemophilus influenzae NOT DETECTED NOT DETECTED Final   Neisseria meningitidis NOT DETECTED NOT DETECTED Final   Pseudomonas aeruginosa NOT DETECTED NOT DETECTED Final   Candida albicans NOT DETECTED NOT DETECTED Final   Candida glabrata NOT DETECTED NOT DETECTED Final   Candida krusei NOT DETECTED NOT DETECTED Final   Candida parapsilosis NOT DETECTED NOT DETECTED Final   Candida tropicalis NOT DETECTED NOT DETECTED Final    Comment: Performed at [facility], [place] [address], [place], [place] [address]  MRSA PCR Screening     Status: None   Collection Time: 01/22/19  9:38 AM   Specimen: Nasal Mucosa; Nasopharyngeal  Result Value Ref Range Status   MRSA by PCR NEGATIVE NEGATIVE Final    Comment:        The GeneXpert MRSA Assay (FDA approved for NASAL specimens only), is one component of a comprehensive MRSA colonization surveillance program. It is not intended to diagnose MRSA infection nor to guide or monitor treatment for MRSA infections. Performed at [facility], [place] [address], [place],  [address]          Radiology Studies: Ct Head Wo Contrast  Result Date: 01/22/2019 CLINICAL DATA:  Altered mental status, unresponsive EXAM: CT HEAD WITHOUT CONTRAST TECHNIQUE: Contiguous axial images were obtained from the base of the skull through the vertex without intravenous contrast. COMPARISON:  01/17/2019 FINDINGS: Brain: No evidence of acute infarction, hemorrhage, hydrocephalus, extra-axial collection or mass lesion/mass effect. There is extensive periventricular and deep white matter hypodensity with nonacute lacunar infarction of the anterior limb of the left internal capsule and the posterior right corona radiata. Vascular: No hyperdense vessel or unexpected calcification. Skull: Normal. Negative for fracture  or focal lesion. Sinuses/Orbits: No acute finding. Other: None. IMPRESSION: No acute intracranial pathology. Advanced small-vessel white matter disease and nonacute lacunar infarctions. Electronically Signed   By: Eddie Candle M.D.   On: 01/22/2019 09:41   Dg Chest Port 1 View  Result Date: 01/22/2019 CLINICAL DATA:  Fever EXAM: PORTABLE CHEST 1 VIEW COMPARISON:  01/17/2019 FINDINGS: Left pacer remains in place, unchanged. Heart is normal size. No confluent airspace opacities or effusions. No acute bony abnormality. IMPRESSION: No acute cardiopulmonary disease. Electronically Signed   By: Rolm Baptise M.D.   On: 01/22/2019 03:12        Scheduled Meds:  sodium chloride   Intravenous Once   acetaminophen  650 mg Oral Q8H   apixaban  2.5 mg Oral BID   chlorhexidine  15 mL Mouth Rinse BID   Chlorhexidine Gluconate Cloth  6 each Topical Daily   cholecalciferol  2,000 Units Oral BID   docusate sodium  100 mg Oral BID   feeding supplement (ENSURE ENLIVE)  237 mL Oral Q1500   mouth rinse  15 mL Mouth Rinse q12n4p   midodrine  5 mg Oral TID WC   multivitamin  1 tablet Oral Daily   pantoprazole  40 mg Oral Daily   vitamin C  500 mg Oral Daily   Continuous Infusions:  cefTRIAXone (ROCEPHIN)  IV Stopped (01/23/19 0215)   DOPamine Stopped (01/23/19 0849)   sodium chloride     sodium chloride     sodium chloride       LOS: 6 days    Phillips Climes, MD  [facility]  If 7PM-7AM, please contact night-coverage [email or web address] 01/23/2019, 2:38 PM
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
## 2019-01-24 LAB — CULTURE, BLOOD (ROUTINE X 2): Special Requests: ADEQUATE

## 2019-01-24 LAB — BASIC METABOLIC PANEL
Anion gap: 9 (ref 5–15)
BUN: 66 mg/dL — ABNORMAL HIGH (ref 8–23)
CO2: 25 mmol/L (ref 22–32)
Calcium: 8 mg/dL — ABNORMAL LOW (ref 8.9–10.3)
Chloride: 104 mmol/L (ref 98–111)
Creatinine, Ser: 1.76 mg/dL — ABNORMAL HIGH (ref 0.61–1.24)
GFR calc Af Amer: 39 mL/min — ABNORMAL LOW (ref >60)
GFR calc non Af Amer: 33 mL/min — ABNORMAL LOW (ref >60)
Glucose, Bld: 119 mg/dL — ABNORMAL HIGH (ref 70–99)
Potassium: 4.5 mmol/L (ref 3.5–5.1)
Sodium: 138 mmol/L (ref 135–145)

## 2019-01-24 LAB — CBC
HCT: 25.5 % — ABNORMAL LOW (ref 39.0–52.0)
Hemoglobin: 9.2 g/dL — ABNORMAL LOW (ref 13.0–17.0)
MCH: 36.1 pg — ABNORMAL HIGH (ref 26.0–34.0)
MCHC: 36.1 g/dL — ABNORMAL HIGH (ref 30.0–36.0)
MCV: 100 fL (ref 80.0–100.0)
Platelets: 219 10*3/uL (ref 150–400)
RBC: 2.55 MIL/uL — ABNORMAL LOW (ref 4.22–5.81)
RDW: 14.9 % (ref 11.5–15.5)
WBC: 16 10*3/uL — ABNORMAL HIGH (ref 4.0–10.5)
nRBC: 0 % (ref 0.0–0.2)

## 2019-01-24 LAB — PROCALCITONIN: Procalcitonin: 37.97 ng/mL

## 2019-01-24 MED ORDER — FUROSEMIDE 10 MG/ML IJ SOLN
20.0000 mg | Freq: Once | INTRAMUSCULAR | Status: AC
  Administered 2019-01-24: 14:00:00 20 mg via INTRAVENOUS
  Filled 2019-01-24: qty 2

## 2019-01-24 NOTE — Progress Notes (Signed)
I called patient's sons, Mr. Harrell Gave and Mr. Dwight and updated them.  I addressed their questions. They verbalized understanding and appreciated the call.
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
## 2019-01-24 NOTE — Progress Notes (Addendum)
PROGRESS NOTE    Frank Elliott  [id number] DOB: 1925/06/07 DOA: 01/17/2019 PCP: [name], MD   Brief Narrative:   85 year old with a history of essential hypertension, combined diastolic/systolic CHF ef [id number] grade 2 diastolic dysfunction, coronary artery disease, persistent atrial fibrillation on Eliquis, heart block status post pacemaker, BPH came with left hip pain.  Had a fall about a week prior to admission from a stationary bike.  Outpatient x-rays with PCP were negative but due to persistence of pain he came to the hospital.  CT showed left acute impacted subcapital femoral neck fracture.  Patient status post surgical repair by Dr. Marcelino Scot 01/17/2019,  he is on anticoagulation for A. fib, initially used to be on heparin drip, transitioned to Eliquis 01/19/2019, postoperative hospital course was complicated by acute blood loss anemia, where he required 2 units PRBC transfusion on 01/21/2019, as well worsening renal failure, with most recent creatinine of 2.7, felt secondary to hypotension, 9/11 a.m., patient with worsening mental status, hypotensive, transferred to ICU for pressors, work-up significant for septic shock due to bacteremia/UTI, required pressors for 36 hours, currently improvement will be transferred out of ICU today.  Subjective:  Patient was weaned off dopamine yesterday, no significant events overnight per staff, he is afebrile.   Assessment & Plan: 1   Principal Problem:   Fracture of femoral neck, left (HCC) Active Problems:   HTN (hypertension)   Pacemaker   Fall at home, initial encounter   Acute kidney injury superimposed on chronic kidney disease ([place])   Combined systolic and diastolic congestive heart failure (HCC)   Persistent atrial fibrillation   Permanent atrial fibrillation   Preop cardiovascular exam   Acute on chronic combined systolic and diastolic CHF (congestive heart failure) ([place])   Septic shock due to UTI/bacteremia - on 9/11, patient  hypotensive, altered mental status (unresponsive) with elevated lactic acid, leukocytosis, and significantly elevated procalcitonin at 57. -Secondary to Klebsiella on dopamine drip initially, currently being tapered off. -recieved IV hydrocortisone -Most likely related to the Klebsiella bacteremia. -Continue with IV Rocephin, will need total of 2 weeks of antibiotics treatment, to be kept on IV antibiotics during hospital stay, and was ready for discharge can be discharged on the quinolones to finish total of 2 weeks, repeat blood cultures to document clearance of infection .discussed with Dr. Prince Rome. -Leukocytosis trending down, procalcitonin trending down as well which is reassuring  Klebsiella bacteremia -This is most likely due to UTI bacteremia, unfortunately urine cultures were not sent, to finish total of 2 weeks, discussed with orthopedic to check postoperative wounds.  Acute metabolic encephalopathy -CT head with no acute finding, this is most likely in the setting of sepsis, appears to be improving today, more communicative and awake.  Acute impacted subcapital fracture of the left femoral neck, -Outpatient x-rays negative.  Confirmed fracture on the CT -Pain control, incentive spirometer, bowel regimen - Patient underwent operative management on 9/8 by Dr Marcelino Scot. -Seen by physical therapy with recommendations for SNF   Chronic combined congestive heart failure with reduced ejection fraction, EF [id number], grade 2 diastolic dysfunction, class III -  Cardiology consulted.  Strict input and output.  Daily weight -He appears to be volume overloaded today, he has been on off pressors for more than 24 hours, I will start on gentle diuresis, only with 20 mg of IV Lasix today.  Acute kidney injury on CKD stage III - Baseline creatinine 1.3, elevated at admission to 1.6, did peak at 2.7,most likely in the  setting of ATN due to sepsis and hypotension . -Improved today at 1.7, avoid nephrotoxic  medications .  Acute blood loss anemia -Baseline hemoglobin around 13, did drop to 8, he received 2 units PRBC 9/10 .  Persistent atrial fibrillation on chronic anticoagulation -Eliquis was on hold for surgery, heparin drip initially, currently back on Eliquis -Continue with amiodarone for heart rate control, I have stopped Coreg given soft blood pressure  Essential hypertension -continue to hold losartan especially with AKI, continue to hold Coreg given low blood pressure .  Coronary artery disease -Currently chest pain-free.  Continue home meds  History of heart block status post [facility] Jude dual-chamber pacemaker -Cardiology is seeing the patient.  GERD -PPI  Addendum 3:30 : Some swelling with discharge was noted from left hip surgical site, cussed with Dr. Ginette Pitman, patient will be kept n.p.o. for possible washout tomorrow, will hold Eliquis as well.  DVT prophylaxis: Heparin drip>>Eliquis Code Status: DNR Family communication: Patient was updated by ICU team today Disposition Plan: Transfer to PCU today  Consultants:   Orthopedic  Cardiology  Procedures:   None so far  Antimicrobials:   None   Objective: Vitals:   01/24/19 0900 01/24/19 1000 01/24/19 1140 01/24/19 1200  BP: 124/62 (!) 106/54  (!) 128/56  Pulse: 61 (!) 59  62  Resp: 16 13  19   Temp:   (!) 97.4 F (36.3 C)   TempSrc:   Oral   SpO2: 100% 95%  100%  Weight:      Height:        Intake/Output Summary (Last 24 hours) at 01/24/2019 1258 Last data filed at 01/24/2019 1200 Gross per 24 hour  Intake 240.07 ml  Output 775 ml  Net -534.93 ml   Filed Weights   01/21/19 0500 01/23/19 0500 01/24/19 0416  Weight: 76.4 kg 79.2 kg 80.9 kg    Examination:  Awake Alert, more coherent and responsive today  symmetrical Chest wall movement, Good air movement bilaterally, CTAB RRR,No Gallops,Rubs or new Murmurs, No Parasternal Heave +ve B.Sounds, Abd Soft, No tenderness, No rebound - guarding or  rigidity. No Cyanosis, Clubbing ,+2 edema, No new Rash or bruise      Data Reviewed:   CBC: Recent Labs  Lab 01/20/19 0525 01/21/19 0412 01/22/19 0808 01/22/19 1047 01/23/19 0424 01/23/19 0514 01/24/19 0306  WBC 11.0* 11.9* 11.5*  --  21.4*  --  16.0*  HGB 9.6* 8.1* 10.0* 8.8* 9.1* 8.5* 9.2*  HCT 28.2* 22.2* 27.4* 26.0* 25.9* 25.0* 25.5*  MCV 101.8* 107.8* 98.6  --  97.4  --  100.0  PLT 325 266 182  --  223  --  [id number]   Basic Metabolic Panel: Recent Labs  Lab 01/19/19 0355 01/20/19 0525 01/21/19 0412 01/22/19 0646 01/22/19 1047 01/22/19 1634 01/23/19 0424 01/23/19 0514 01/24/19 0306  NA 136 137 134* 136 135 138 136 138 138  K 4.1 4.8 4.7 4.5 5.6* 4.9 4.4 4.4 4.5  CL 98 101 98 99  --  105 103  --  104  CO2 29 27 24 25   --  24 24  --  25  GLUCOSE 119* 129* 115* 96  --  126* 114*  --  119*  BUN 44* 56* 73* 74*  --  69* 73*  --  66*  CREATININE 1.84* 2.11* 2.77* 2.72*  --  2.27* 2.28*  --  1.76*  CALCIUM 8.5* 8.0* 7.6* 7.8*  --  7.5* 7.9*  --  8.0*  MG 2.1 2.2  2.2 2.1  --   --  2.3  --   --   PHOS  --   --   --   --   --   --  3.7  --   --    GFR: Estimated Creatinine Clearance: 27.9 mL/min (A) (by C-G formula based on SCr of 1.76 mg/dL (H)). Liver Function Tests: No results for input(s): AST, ALT, ALKPHOS, BILITOT, PROT, ALBUMIN in the last 168 hours. No results for input(s): LIPASE, AMYLASE in the last 168 hours. Recent Labs  Lab 01/22/19 0746  AMMONIA 14   Coagulation Profile: No results for input(s): INR, PROTIME in the last 168 hours. Cardiac Enzymes: No results for input(s): CKTOTAL, CKMB, CKMBINDEX, TROPONINI in the last 168 hours. BNP (last 3 results) Recent Labs    10/08/18 1419  PROBNP 3,397*   HbA1C: No results for input(s): HGBA1C in the last 72 hours. CBG: Recent Labs  Lab 01/22/19 2343 01/23/19 0435 01/23/19 0735 01/23/19 1143 01/23/19 1606  GLUCAP 111* 111* 105* 104* 124*   Lipid Profile: No results for input(s): CHOL, HDL,  LDLCALC, TRIG, CHOLHDL, LDLDIRECT in the last 72 hours. Thyroid Function Tests: No results for input(s): TSH, T4TOTAL, FREET4, T3FREE, THYROIDAB in the last 72 hours. Anemia Panel: No results for input(s): VITAMINB12, FOLATE, FERRITIN, TIBC, IRON, RETICCTPCT in the last 72 hours. Sepsis Labs: Recent Labs  Lab 01/22/19 0744 01/22/19 1045 01/23/19 0424 01/24/19 0306  PROCALCITON 37.51  --  57.02 37.97  LATICACIDVEN 2.8* 2.1*  --   --     Recent Results (from the past 240 hour(s))  SARS Coronavirus 2 [facility] order, Performed in [facility] hospital lab) Nasopharyngeal Nasopharyngeal Swab     Status: None   Collection Time: 01/17/19  3:45 PM   Specimen: Nasopharyngeal Swab  Result Value Ref Range Status   SARS Coronavirus 2 NEGATIVE NEGATIVE Final    Comment: (NOTE) If result is NEGATIVE SARS-CoV-2 target nucleic acids are NOT DETECTED. The SARS-CoV-2 RNA is generally detectable in upper and lower  respiratory specimens during the acute phase of infection. The lowest  concentration of SARS-CoV-2 viral copies this assay can detect is 250  copies / mL. A negative result does not preclude SARS-CoV-2 infection  and should not be used as the sole basis for treatment or other  patient management decisions.  A negative result may occur with  improper specimen collection / handling, submission of specimen other  than nasopharyngeal swab, presence of viral mutation(s) within the  areas targeted by this assay, and inadequate number of viral copies  (<250 copies / mL). A negative result must be combined with clinical  observations, patient history, and epidemiological information. If result is POSITIVE SARS-CoV-2 target nucleic acids are DETECTED. The SARS-CoV-2 RNA is generally detectable in upper and lower  respiratory specimens dur ing the acute phase of infection.  Positive  results are indicative of active infection with SARS-CoV-2.  Clinical  correlation with patient history and  other diagnostic information is  necessary to determine patient infection status.  Positive results do  not rule out bacterial infection or co-infection with other viruses. If result is PRESUMPTIVE POSTIVE SARS-CoV-2 nucleic acids MAY BE PRESENT.   A presumptive positive result was obtained on the submitted specimen  and confirmed on repeat testing.  While 2019 novel coronavirus  (SARS-CoV-2) nucleic acids may be present in the submitted sample  additional confirmatory testing may be necessary for epidemiological  and / or clinical management purposes  to differentiate  between  SARS-CoV-2 and other Sarbecovirus currently known to infect humans.  If clinically indicated additional testing with an alternate test  methodology [phone]) is advised. The SARS-CoV-2 RNA is generally  detectable in upper and lower respiratory sp ecimens during the acute  phase of infection. The expected result is Negative. Fact Sheet for Patients:  StrictlyIdeas.no Fact Sheet for Healthcare Providers: BankingDealers.co.za This test is not yet approved or cleared by the [place] FDA and has been authorized for detection and/or diagnosis of SARS-CoV-2 by FDA under an Emergency Use Authorization (EUA).  This EUA will remain in effect (meaning this test can be used) for the duration of the COVID-19 declaration under Section 564(b)(1) of the Act, 21 U.S.C. section 360bbb-3(b)(1), unless the authorization is terminated or revoked sooner. Performed at [facility], [place] [address] [place], [place] [address]   Surgical PCR screen     Status: None   Collection Time: 01/17/19  9:56 PM   Specimen: Nasal Mucosa; Nasal Swab  Result Value Ref Range Status   MRSA, PCR NEGATIVE NEGATIVE Final   Staphylococcus aureus NEGATIVE NEGATIVE Final    Comment: (NOTE) The Xpert SA Assay (FDA approved for NASAL specimens in patients 40 years of age and older), is one  component of a comprehensive surveillance program. It is not intended to diagnose infection nor to guide or monitor treatment. Performed at [facility], [place] [address] [place], [place] [address]   Culture, blood (routine x 2)     Status: None (Preliminary result)   Collection Time: 01/22/19  5:35 AM   Specimen: BLOOD  Result Value Ref Range Status   Specimen Description BLOOD RIGHT HAND  Final   Special Requests   Final    BOTTLES DRAWN AEROBIC ONLY Blood Culture results may not be optimal due to an inadequate volume of blood received in culture bottles   Culture   Final    NO GROWTH 2 DAYS Performed at [facility], [place] [address] [place], [place] [address]    Report Status PENDING  Incomplete  Culture, blood (routine x 2)     Status: Abnormal   Collection Time: 01/22/19  6:45 AM   Specimen: BLOOD LEFT ARM  Result Value Ref Range Status   Specimen Description BLOOD LEFT ARM  Final   Special Requests   Final    BOTTLES DRAWN AEROBIC ONLY Blood Culture adequate volume   Culture  Setup Time   Final    GRAM NEGATIVE RODS AEROBIC BOTTLE ONLY CRITICAL RESULT CALLED TO, READ BACK BY AND VERIFIED WITH: G. ABBOTT,PHARMD 0106 01/23/2019 Mena Goes Performed at [facility], [place] [address], [place], [place] [address]    Culture KLEBSIELLA PNEUMONIAE (A)  Final   Report Status 01/24/2019 FINAL  Final   Organism ID, Bacteria KLEBSIELLA PNEUMONIAE  Final      Susceptibility   Klebsiella pneumoniae - MIC*    AMPICILLIN RESISTANT Resistant     CEFAZOLIN <=4 SENSITIVE Sensitive     CEFEPIME <=1 SENSITIVE Sensitive     CEFTAZIDIME <=1 SENSITIVE Sensitive     CEFTRIAXONE <=1 SENSITIVE Sensitive     CIPROFLOXACIN <=0.25 SENSITIVE Sensitive     GENTAMICIN <=1 SENSITIVE Sensitive     IMIPENEM <=0.25 SENSITIVE Sensitive     TRIMETH/SULFA <=20 SENSITIVE Sensitive     AMPICILLIN/SULBACTAM 4 SENSITIVE Sensitive     PIP/TAZO <=4 SENSITIVE Sensitive     Extended ESBL NEGATIVE  Sensitive     *  KLEBSIELLA PNEUMONIAE  Blood Culture ID Panel (Reflexed)     Status: Abnormal   Collection Time: 01/22/19  6:45 AM  Result Value Ref Range Status   Enterococcus species NOT DETECTED NOT DETECTED Final   Listeria monocytogenes NOT DETECTED NOT DETECTED Final   Staphylococcus species NOT DETECTED NOT DETECTED Final   Staphylococcus aureus (BCID) NOT DETECTED NOT DETECTED Final   Streptococcus species NOT DETECTED NOT DETECTED Final   Streptococcus agalactiae NOT DETECTED NOT DETECTED Final   Streptococcus pneumoniae NOT DETECTED NOT DETECTED Final   Streptococcus pyogenes NOT DETECTED NOT DETECTED Final   Acinetobacter baumannii NOT DETECTED NOT DETECTED Final   Enterobacteriaceae species DETECTED (A) NOT DETECTED Final    Comment: Enterobacteriaceae represent a large family of gram-negative bacteria, not a single organism. CRITICAL RESULT CALLED TO, READ BACK BY AND VERIFIED WITH: G. ABBOTT,PHARMD 0106 01/23/2019 T. TYSOR    Enterobacter cloacae complex NOT DETECTED NOT DETECTED Final   Escherichia coli NOT DETECTED NOT DETECTED Final   Klebsiella oxytoca NOT DETECTED NOT DETECTED Final   Klebsiella pneumoniae DETECTED (A) NOT DETECTED Final    Comment: CRITICAL RESULT CALLED TO, READ BACK BY AND VERIFIED WITH: G. ABBOTT,PHARMD 0106 01/23/2019 T. TYSOR    Proteus species NOT DETECTED NOT DETECTED Final   Serratia marcescens NOT DETECTED NOT DETECTED Final   Carbapenem resistance NOT DETECTED NOT DETECTED Final   Haemophilus influenzae NOT DETECTED NOT DETECTED Final   Neisseria meningitidis NOT DETECTED NOT DETECTED Final   Pseudomonas aeruginosa NOT DETECTED NOT DETECTED Final   Candida albicans NOT DETECTED NOT DETECTED Final   Candida glabrata NOT DETECTED NOT DETECTED Final   Candida krusei NOT DETECTED NOT DETECTED Final   Candida parapsilosis NOT DETECTED NOT DETECTED Final   Candida tropicalis NOT DETECTED NOT DETECTED Final    Comment: Performed at [facility], [place]. [address] [place], [place] [address]  MRSA PCR Screening     Status: None   Collection Time: 01/22/19  9:38 AM   Specimen: Nasal Mucosa; Nasopharyngeal  Result Value Ref Range Status   MRSA by PCR NEGATIVE NEGATIVE Final    Comment:        The GeneXpert MRSA Assay (FDA approved for NASAL specimens only), is one component of a comprehensive MRSA colonization surveillance program. It is not intended to diagnose MRSA infection nor to guide or monitor treatment for MRSA infections. Performed at [facility], [place] [address] [place], [place] [address]          Radiology Studies: No results found.      Scheduled Meds: . acetaminophen  650 mg Oral Q8H  . apixaban  2.5 mg Oral BID  . chlorhexidine  15 mL Mouth Rinse BID  . Chlorhexidine Gluconate Cloth  6 each Topical Daily  . cholecalciferol  2,000 Units Oral BID  . docusate sodium  100 mg Oral BID  . feeding supplement (ENSURE ENLIVE)  237 mL Oral Q1500  . mouth rinse  15 mL Mouth Rinse q12n4p  . midodrine  5 mg Oral TID WC  . multivitamin  1 tablet Oral Daily  . pantoprazole  40 mg Oral Daily  . vitamin C  500 mg Oral Daily   Continuous Infusions: . cefTRIAXone (ROCEPHIN)  IV Stopped (01/23/19 2221)  . DOPamine Stopped (01/23/19 0849)     LOS: 7 days    Phillips Climes, MD [facility]  If 7PM-7AM, please contact night-coverage [email or web address] 01/24/2019, 12:58 PM

## 2019-01-24 NOTE — Progress Notes (Signed)
Pt received to 5w22 from 49M by nursing staff via bed. AOx1, on room air. No s/s distress noted. Multiple areas of bruising noted on chest & bil UE. Large bruise to medial right forearm. bil upper extremities swelling. Sacral foam in place, Sx incision in place left hip. See skin assessment for full assessment. CHG bath completed. Placed on telemetry. Condom cath in place. Bed low with wheels locked. Call bell is within reach. Will continue to monitor.

## 2019-01-24 NOTE — TOC Initial Note (Signed)
Transition of Care [facility]) - Initial/Assessment Note    Patient Details  Name: Frank Elliott MRN: [id number] Date of Birth: 1926-01-04  Transition of Care [facility]) CM/SW Contact:    Gelene Mink, [place] Phone Number: 01/24/2019, 10:23 AM  Clinical Narrative:            CSW called and spoke with the patient's son, Frank Elliott. CSW introduced herself and explained her role. CSW shared the therapy recommendation. [name] stated that his father has been to rehab before and is in support of him going back. He stated that he has been to [facility] and [facility]. [name] stated that he would prefer [facility] as his first choice. He also stated that he would not like his father to return back to [facility]. [name] stated that he and his brother are his father's HCPOA. [name] stated that he will be going out of town until next Sunday but his brother would be coming and staying in [place]. CSW obtained permission to send him a copy of the CMS SNF list by email. CSW obtained permission to fax the patient out and provide bed offers.   CSW will continue to follow and assist with disposition planning.          Expected Discharge Plan: Skilled Nursing Facility Barriers to Discharge: Continued Medical Work up   Patient Goals and CMS Choice Patient states their goals for this hospitalization and ongoing recovery are:: Pt son is agreeable to SNF. CMS Medicare.gov Compare Post Acute Care list provided to:: Patient Represenative (must comment) Choice offered to / list presented to : [facility] POA / Guardian, Adult Children  Expected Discharge Plan and Services Expected Discharge Plan: [place] In-house Referral: Clinical Social Work Discharge Planning Services: NA Post Acute Care Choice: [place] Living arrangements for the past 2 months: [place]                 DME Arranged: N/A DME Agency: NA       HH Arranged: NA [place] Agency: NA        Prior  Living Arrangements/Services Living arrangements for the past 2 months: [place] Lives with:: Self Patient language and need for interpreter reviewed:: No Do you feel safe going back to the place where you live?: Yes      Need for Family Participation in Patient Care: Yes (Comment) Care giver support system in place?: Yes (comment)   Criminal Activity/Legal Involvement Pertinent to Current Situation/Hospitalization: No - Comment as needed  Activities of Daily Living Home Assistive Devices/Equipment: Other (Comment) ADL Screening (condition at time of admission) Patient's cognitive ability adequate to safely complete daily activities?: No Is the patient deaf or have difficulty hearing?: Yes Does the patient have difficulty seeing, even when wearing glasses/contacts?: No Does the patient have difficulty concentrating, remembering, or making decisions?: Yes Patient able to express need for assistance with ADLs?: Yes Does the patient have difficulty dressing or bathing?: Yes Independently performs ADLs?: No Does the patient have difficulty walking or climbing stairs?: Yes Weakness of Legs: Left Weakness of Arms/Hands: Both  Permission Sought/Granted Permission sought to share information with : Case Manager Permission granted to share information with : Yes, Verbal Permission Granted  Share Information with NAME: Normanna  Permission granted to share info w AGENCY: All SNF  Permission granted to share info w Relationship: Sons/HCPOA     Emotional Assessment Appearance:: Appears stated age Attitude/Demeanor/Rapport: Unable to Assess Affect (typically observed): Unable to Assess  Orientation: : Oriented to Self, Oriented to Place Alcohol / Substance Use: Not Applicable Psych Involvement: No (comment)  Admission diagnosis:  Peripheral edema [R60.9] Left displaced femoral neck fracture (HCC) [S72.002A] Closed left hip fracture, initial encounter [facility])  [S72.002A] Patient Active Problem List   Diagnosis Date Noted  . Fracture of femoral neck, left ([place]) 01/17/2019  . Fall at home, initial encounter 01/17/2019  . Acute kidney injury superimposed on chronic kidney disease ([place]) 01/17/2019  . Combined systolic and diastolic congestive heart failure ([place]) 01/17/2019  . Persistent atrial fibrillation 01/17/2019  . Permanent atrial fibrillation 01/17/2019  . Preop cardiovascular exam   . Acute on chronic combined systolic and diastolic CHF (congestive heart failure) ([place])   . Colitis 03/05/2018  . CKD (chronic kidney disease) stage 3, GFR 30-59 ml/min (HCC) 03/05/2018  . Pacemaker 02/11/2018  . Syncope 02/03/2018  . AV block, [place] 2 02/03/2018  . Bilateral lower extremity edema 12/19/2015  . Chronic systolic heart failure ([place]) 04/25/2013  . HTN (hypertension) 04/20/2012  . Chronic diastolic CHF (congestive heart failure) ([place]) 04/18/2012  . COPD (chronic obstructive pulmonary disease) ([place]) 04/18/2012   PCP:  [name], MD Pharmacy:   [id number] [place], [place] [address] [place] [address] Phone: [phone] Fax: [phone]  [id number] [place] - [address] [address] [place] [id number] [place] [address] Phone: [phone] Fax: [phone]  [id number] [place], [place] - [address] [place] [address] Phone: [phone] Fax: [phone]     Social Determinants of Health (SDOH) Interventions    Readmission Risk Interventions No flowsheet data found.

## 2019-01-24 NOTE — Progress Notes (Signed)
Report called to receiving RN on 5W, all questions answered. Pt A/O X1, VSS, ra. Grandson updated with new room information at bedside. All belonging taken with patient. Pt transported via bed with mask on.

## 2019-01-25 LAB — CBC
HCT: 27.5 % — ABNORMAL LOW (ref 39.0–52.0)
Hemoglobin: 9.7 g/dL — ABNORMAL LOW (ref 13.0–17.0)
MCH: 36.5 pg — ABNORMAL HIGH (ref 26.0–34.0)
MCHC: 35.3 g/dL (ref 30.0–36.0)
MCV: 103.4 fL — ABNORMAL HIGH (ref 80.0–100.0)
Platelets: 243 10*3/uL (ref 150–400)
RBC: 2.66 MIL/uL — ABNORMAL LOW (ref 4.22–5.81)
RDW: 14.6 % (ref 11.5–15.5)
WBC: 17.1 10*3/uL — ABNORMAL HIGH (ref 4.0–10.5)
nRBC: 0 % (ref 0.0–0.2)

## 2019-01-25 LAB — TYPE AND SCREEN
ABO/RH(D): A POS
Antibody Screen: NEGATIVE
Unit division: 0
Unit division: 0
Unit division: 0
Unit division: 0

## 2019-01-25 LAB — BPAM RBC
Blood Product Expiration Date: 202010022359
Blood Product Expiration Date: 202010042359
Blood Product Expiration Date: 202010092359
Blood Product Expiration Date: 202010092359
ISSUE DATE / TIME: 202009082009
ISSUE DATE / TIME: 202009091343
ISSUE DATE / TIME: 202009101214
ISSUE DATE / TIME: 202009101644
Unit Type and Rh: 6200
Unit Type and Rh: 6200
Unit Type and Rh: 6200
Unit Type and Rh: 6200

## 2019-01-25 LAB — BASIC METABOLIC PANEL
Anion gap: 14 (ref 5–15)
BUN: 57 mg/dL — ABNORMAL HIGH (ref 8–23)
CO2: 21 mmol/L — ABNORMAL LOW (ref 22–32)
Calcium: 8.2 mg/dL — ABNORMAL LOW (ref 8.9–10.3)
Chloride: 103 mmol/L (ref 98–111)
Creatinine, Ser: 1.57 mg/dL — ABNORMAL HIGH (ref 0.61–1.24)
GFR calc Af Amer: 44 mL/min — ABNORMAL LOW (ref >60)
GFR calc non Af Amer: 38 mL/min — ABNORMAL LOW (ref >60)
Glucose, Bld: 123 mg/dL — ABNORMAL HIGH (ref 70–99)
Potassium: 4.6 mmol/L (ref 3.5–5.1)
Sodium: 138 mmol/L (ref 135–145)

## 2019-01-25 LAB — GLUCOSE, CAPILLARY: Glucose-Capillary: 19 mg/dL — CL (ref 70–99)

## 2019-01-25 LAB — PROCALCITONIN: Procalcitonin: 19.21 ng/mL

## 2019-01-25 MED ORDER — FUROSEMIDE 10 MG/ML IJ SOLN
20.0000 mg | Freq: Four times a day (QID) | INTRAMUSCULAR | Status: AC
  Administered 2019-01-25 (×2): 20 mg via INTRAVENOUS
  Filled 2019-01-25 (×2): qty 2

## 2019-01-25 MED ORDER — CEFAZOLIN SODIUM-DEXTROSE 2-4 GM/100ML-% IV SOLN
2.0000 g | Freq: Three times a day (TID) | INTRAVENOUS | Status: DC
  Administered 2019-01-26 – 2019-01-28 (×8): 2 g via INTRAVENOUS
  Filled 2019-01-25 (×10): qty 100

## 2019-01-25 MED ORDER — APIXABAN 2.5 MG PO TABS
2.5000 mg | ORAL_TABLET | Freq: Two times a day (BID) | ORAL | Status: DC
  Administered 2019-01-25 – 2019-01-26 (×2): 2.5 mg via ORAL
  Filled 2019-01-25 (×2): qty 1

## 2019-01-25 NOTE — Progress Notes (Signed)
PROGRESS NOTE    Frank Elliott  [id number] DOB: 1926-03-31 DOA: 01/17/2019 PCP: [name], MD   Brief Narrative:   85 year old with a history of essential hypertension, combined diastolic/systolic CHF ef [id number] grade 2 diastolic dysfunction, coronary artery disease, persistent atrial fibrillation on Eliquis, heart block status post pacemaker, BPH came with left hip pain.  Had a fall about a week prior to admission from a stationary bike.  Outpatient x-rays with PCP were negative but due to persistence of pain he came to the hospital.  CT showed left acute impacted subcapital femoral neck fracture.  Patient status post surgical repair by Dr. Marcelino Scot 01/17/2019,  he is on anticoagulation for A. fib, initially used to be on heparin drip, transitioned to Eliquis 01/19/2019, postoperative hospital course was complicated by acute blood loss anemia, where he required 2 units PRBC transfusion on 01/21/2019, as well worsening renal failure, with most recent creatinine of 2.7, felt secondary to hypotension, 9/11 a.m., patient with worsening mental status, hypotensive, transferred to ICU for pressors, work-up significant for septic shock due to bacteremia/UTI, required pressors for 36 hours, currently improvement will be transferred out of ICU today.  Subjective:  Patient denies any complaints today, wound was seen and examined by orthopedic, no evidence of discharge or infection .   Assessment & Plan: 1   Principal Problem:   Fracture of femoral neck, left (HCC) Active Problems:   HTN (hypertension)   Pacemaker   Fall at home, initial encounter   Acute kidney injury superimposed on chronic kidney disease ([place])   Combined systolic and diastolic congestive heart failure (HCC)   Persistent atrial fibrillation   Permanent atrial fibrillation   Preop cardiovascular exam   Acute on chronic combined systolic and diastolic CHF (congestive heart failure) ([place])   Septic shock due to UTI/bacteremia - on  9/11, patient hypotensive, altered mental status (unresponsive) with elevated lactic acid, leukocytosis, and significantly elevated procalcitonin at 57. -Secondary to Klebsiella on dopamine drip initially, currently being tapered off. -recieved IV hydrocortisone -Most likely related to the Klebsiella bacteremia. -Treated with IV Rocephin initially, currently transitioned to Ancef ,will need total of 2 weeks of antibiotics treatment, to be kept on IV antibiotics during hospital stay, and was ready for discharge can be discharged on the quinolones to finish total of 2 weeks, repeat blood cultures to document clearance of infection .discussed with Dr. Prince Rome. -Leukocytosis trending down, procalcitonin trending down as well which is reassuring  Klebsiella bacteremia -This is most likely due to UTI , unfortunately urine cultures were not sent, to finish total of 2 weeks,  - discussed with orthopedic to check postoperative wounds, it has been checked, no evidence of discharge  Acute metabolic encephalopathy -CT head with no acute finding, this is most likely in the setting of sepsis, appears to be improving today, more communicative and awake.  Acute impacted subcapital fracture of the left femoral neck, -Outpatient x-rays negative.  Confirmed fracture on the CT -Pain control, incentive spirometer, bowel regimen - Patient underwent operative management on 9/8 by Dr Marcelino Scot. -Seen by physical therapy with recommendations for SNF   Chronic combined congestive heart failure with reduced ejection fraction, EF [id number], grade 2 diastolic dysfunction, class III -  Cardiology consulted.  Strict input and output.  Daily weight -He appears to be volume overloaded today, he has been on off pressors for more than 24 hours, I will start on gentle diuresis, only with 20 mg of IV Lasix today.  Acute kidney injury on  CKD stage III - Baseline creatinine 1.3, elevated at admission to 1.6, did peak at 2.7,most likely in  the setting of ATN due to sepsis and hypotension . -Improved today at 1.57, avoid nephrotoxic medications .  Acute blood loss anemia -Baseline hemoglobin around 13, did drop to 8, he received 2 units PRBC 9/10 .  Hemoglobin remained stable  Persistent atrial fibrillation on chronic anticoagulation -Eliquis was on hold for surgery, heparin drip initially, currently back on Eliquis -Continue with amiodarone for heart rate control, I have stopped Coreg given soft blood pressure  Essential hypertension -continue to hold losartan especially with AKI, continue to hold Coreg given low blood pressure .  Coronary artery disease -Currently chest pain-free.  Continue home meds  History of heart block status post [facility] Jude dual-chamber pacemaker -Cardiology is seeing the patient.  GERD -PPI   DVT prophylaxis: Heparin drip>>Eliquis Code Status: DNR Family communication: D/W with son via phone Disposition Plan: Will need SNF when medically stable.  Consultants:   Orthopedic  Cardiology  Procedures:   None so far  Antimicrobials:   None   Objective: Vitals:   01/25/19 0412 01/25/19 0446 01/25/19 0837 01/25/19 1303  BP:  (!) 144/57 (!) 128/58 135/64  Pulse:  (!) 59 61 (!) 59  Resp:  16 16 16   Temp:  98 F (36.7 C) 98.3 F (36.8 C) 97.9 F (36.6 C)  TempSrc:   Oral Oral  SpO2:  99% 100% 100%  Weight: 97.4 kg     Height:        Intake/Output Summary (Last 24 hours) at 01/25/2019 1617 Last data filed at 01/25/2019 0447 Gross per 24 hour  Intake 0 ml  Output 1525 ml  Net -1525 ml   Filed Weights   01/24/19 0416 01/24/19 2116 01/25/19 0412  Weight: 80.9 kg 94.7 kg 97.4 kg    Examination:  Awake Alert, active  symmetrical Chest wall movement, Good air movement bilaterally, CTAB RRR,No Gallops,Rubs or new Murmurs, No Parasternal Heave +ve B.Sounds, Abd Soft, No tenderness, No rebound - guarding or rigidity. No Cyanosis, Clubbing ,+1 edema, surgical wound wound was  seen and examined, no discharge, looks clean     Data Reviewed:   CBC: Recent Labs  Lab 01/21/19 0412 01/22/19 0808 01/22/19 1047 01/23/19 0424 01/23/19 0514 01/24/19 0306 01/25/19 0452  WBC 11.9* 11.5*  --  21.4*  --  16.0* 17.1*  HGB 8.1* 10.0* 8.8* 9.1* 8.5* 9.2* 9.7*  HCT 22.2* 27.4* 26.0* 25.9* 25.0* 25.5* 27.5*  MCV 107.8* 98.6  --  97.4  --  100.0 103.4*  PLT 266 182  --  223  --  219 0000000   Basic Metabolic Panel: Recent Labs  Lab 01/19/19 0355 01/20/19 0525 01/21/19 0412 01/22/19 0646  01/22/19 1634 01/23/19 0424 01/23/19 0514 01/24/19 0306 01/25/19 0452  NA 136 137 134* 136   < > 138 136 138 138 138  K 4.1 4.8 4.7 4.5   < > 4.9 4.4 4.4 4.5 4.6  CL 98 101 98 99  --  105 103  --  104 103  CO2 29 27 24 25   --  24 24  --  25 21*  GLUCOSE 119* 129* 115* 96  --  126* 114*  --  119* 123*  BUN 44* 56* 73* 74*  --  69* 73*  --  66* 57*  CREATININE 1.84* 2.11* 2.77* 2.72*  --  2.27* 2.28*  --  1.76* 1.57*  CALCIUM 8.5* 8.0* 7.6* 7.8*  --  7.5* 7.9*  --  8.0* 8.2*  MG 2.1 2.2 2.2 2.1  --   --  2.3  --   --   --   PHOS  --   --   --   --   --   --  3.7  --   --   --    < > = values in this interval not displayed.   GFR: Estimated Creatinine Clearance: 36 mL/min (A) (by C-G formula based on SCr of 1.57 mg/dL (H)). Liver Function Tests: No results for input(s): AST, ALT, ALKPHOS, BILITOT, PROT, ALBUMIN in the last 168 hours. No results for input(s): LIPASE, AMYLASE in the last 168 hours. Recent Labs  Lab 01/22/19 0746  AMMONIA 14   Coagulation Profile: No results for input(s): INR, PROTIME in the last 168 hours. Cardiac Enzymes: No results for input(s): CKTOTAL, CKMB, CKMBINDEX, TROPONINI in the last 168 hours. BNP (last 3 results) Recent Labs    10/08/18 1419  PROBNP 3,397*   HbA1C: No results for input(s): HGBA1C in the last 72 hours. CBG: Recent Labs  Lab 01/22/19 2343 01/23/19 0435 01/23/19 0735 01/23/19 1143 01/23/19 1606  GLUCAP 111* 111*  105* 104* 124*   Lipid Profile: No results for input(s): CHOL, HDL, LDLCALC, TRIG, CHOLHDL, LDLDIRECT in the last 72 hours. Thyroid Function Tests: No results for input(s): TSH, T4TOTAL, FREET4, T3FREE, THYROIDAB in the last 72 hours. Anemia Panel: No results for input(s): VITAMINB12, FOLATE, FERRITIN, TIBC, IRON, RETICCTPCT in the last 72 hours. Sepsis Labs: Recent Labs  Lab 01/22/19 0744 01/22/19 1045 01/23/19 0424 01/24/19 0306 01/25/19 0452  PROCALCITON 37.51  --  57.02 37.97 19.21  LATICACIDVEN 2.8* 2.1*  --   --   --     Recent Results (from the past 240 hour(s))  SARS Coronavirus 2 [facility] order, Performed in [facility] hospital lab) Nasopharyngeal Nasopharyngeal Swab     Status: None   Collection Time: 01/17/19  3:45 PM   Specimen: Nasopharyngeal Swab  Result Value Ref Range Status   SARS Coronavirus 2 NEGATIVE NEGATIVE Final    Comment: (NOTE) If result is NEGATIVE SARS-CoV-2 target nucleic acids are NOT DETECTED. The SARS-CoV-2 RNA is generally detectable in upper and lower  respiratory specimens during the acute phase of infection. The lowest  concentration of SARS-CoV-2 viral copies this assay can detect is 250  copies / mL. A negative result does not preclude SARS-CoV-2 infection  and should not be used as the sole basis for treatment or other  patient management decisions.  A negative result may occur with  improper specimen collection / handling, submission of specimen other  than nasopharyngeal swab, presence of viral mutation(s) within the  areas targeted by this assay, and inadequate number of viral copies  (<250 copies / mL). A negative result must be combined with clinical  observations, patient history, and epidemiological information. If result is POSITIVE SARS-CoV-2 target nucleic acids are DETECTED. The SARS-CoV-2 RNA is generally detectable in upper and lower  respiratory specimens dur ing the acute phase of infection.  Positive  results are  indicative of active infection with SARS-CoV-2.  Clinical  correlation with patient history and other diagnostic information is  necessary to determine patient infection status.  Positive results do  not rule out bacterial infection or co-infection with other viruses. If result is PRESUMPTIVE POSTIVE SARS-CoV-2 nucleic acids MAY BE PRESENT.   A presumptive positive result was obtained on the submitted specimen  and confirmed on repeat testing.  While 2019 novel coronavirus  (SARS-CoV-2) nucleic acids may be present in the submitted sample  additional confirmatory testing may be necessary for epidemiological  and / or clinical management purposes  to differentiate between  SARS-CoV-2 and other Sarbecovirus currently known to infect humans.  If clinically indicated additional testing with an alternate test  methodology [phone]) is advised. The SARS-CoV-2 RNA is generally  detectable in upper and lower respiratory sp ecimens during the acute  phase of infection. The expected result is Negative. Fact Sheet for Patients:  StrictlyIdeas.no Fact Sheet for Healthcare Providers: BankingDealers.co.za This test is not yet approved or cleared by the [place] FDA and has been authorized for detection and/or diagnosis of SARS-CoV-2 by FDA under an Emergency Use Authorization (EUA).  This EUA will remain in effect (meaning this test can be used) for the duration of the COVID-19 declaration under Section 564(b)(1) of the Act, 21 U.S.C. section 360bbb-3(b)(1), unless the authorization is terminated or revoked sooner. Performed at [facility], [place] [address], [place], [place] [address]   Surgical PCR screen     Status: None   Collection Time: 01/17/19  9:56 PM   Specimen: Nasal Mucosa; Nasal Swab  Result Value Ref Range Status   MRSA, PCR NEGATIVE NEGATIVE Final   Staphylococcus aureus NEGATIVE NEGATIVE Final    Comment: (NOTE) The Xpert  SA Assay (FDA approved for NASAL specimens in patients 39 years of age and older), is one component of a comprehensive surveillance program. It is not intended to diagnose infection nor to guide or monitor treatment. Performed at [facility], [place] [address], [place], [place] [address]   Culture, blood (routine x 2)     Status: None (Preliminary result)   Collection Time: 01/22/19  5:35 AM   Specimen: BLOOD  Result Value Ref Range Status   Specimen Description BLOOD RIGHT HAND  Final   Special Requests   Final    BOTTLES DRAWN AEROBIC ONLY Blood Culture results may not be optimal due to an inadequate volume of blood received in culture bottles   Culture   Final    NO GROWTH 3 DAYS Performed at [facility], [place] [address] [place], [place] [address]    Report Status PENDING  Incomplete  Culture, blood (routine x 2)     Status: Abnormal   Collection Time: 01/22/19  6:45 AM   Specimen: BLOOD LEFT ARM  Result Value Ref Range Status   Specimen Description BLOOD LEFT ARM  Final   Special Requests   Final    BOTTLES DRAWN AEROBIC ONLY Blood Culture adequate volume   Culture  Setup Time   Final    GRAM NEGATIVE RODS AEROBIC BOTTLE ONLY CRITICAL RESULT CALLED TO, READ BACK BY AND VERIFIED WITH: G. ABBOTT,PHARMD 0106 01/23/2019 Mena Goes Performed at [facility], [place] [address] [place], [place] [address]    Culture KLEBSIELLA PNEUMONIAE (A)  Final   Report Status 01/24/2019 FINAL  Final   Organism ID, Bacteria KLEBSIELLA PNEUMONIAE  Final      Susceptibility   Klebsiella pneumoniae - MIC*    AMPICILLIN RESISTANT Resistant     CEFAZOLIN <=4 SENSITIVE Sensitive     CEFEPIME <=1 SENSITIVE Sensitive     CEFTAZIDIME <=1 SENSITIVE Sensitive     CEFTRIAXONE <=1 SENSITIVE Sensitive     CIPROFLOXACIN <=0.25 SENSITIVE Sensitive     GENTAMICIN <=1 SENSITIVE Sensitive     IMIPENEM <=0.25 SENSITIVE Sensitive  TRIMETH/SULFA <=20 SENSITIVE Sensitive      AMPICILLIN/SULBACTAM 4 SENSITIVE Sensitive     PIP/TAZO <=4 SENSITIVE Sensitive     Extended ESBL NEGATIVE Sensitive     * KLEBSIELLA PNEUMONIAE  Blood Culture ID Panel (Reflexed)     Status: Abnormal   Collection Time: 01/22/19  6:45 AM  Result Value Ref Range Status   Enterococcus species NOT DETECTED NOT DETECTED Final   Listeria monocytogenes NOT DETECTED NOT DETECTED Final   Staphylococcus species NOT DETECTED NOT DETECTED Final   Staphylococcus aureus (BCID) NOT DETECTED NOT DETECTED Final   Streptococcus species NOT DETECTED NOT DETECTED Final   Streptococcus agalactiae NOT DETECTED NOT DETECTED Final   Streptococcus pneumoniae NOT DETECTED NOT DETECTED Final   Streptococcus pyogenes NOT DETECTED NOT DETECTED Final   Acinetobacter baumannii NOT DETECTED NOT DETECTED Final   Enterobacteriaceae species DETECTED (A) NOT DETECTED Final    Comment: Enterobacteriaceae represent a large family of gram-negative bacteria, not a single organism. CRITICAL RESULT CALLED TO, READ BACK BY AND VERIFIED WITH: G. ABBOTT,PHARMD 0106 01/23/2019 T. TYSOR    Enterobacter cloacae complex NOT DETECTED NOT DETECTED Final   Escherichia coli NOT DETECTED NOT DETECTED Final   Klebsiella oxytoca NOT DETECTED NOT DETECTED Final   Klebsiella pneumoniae DETECTED (A) NOT DETECTED Final    Comment: CRITICAL RESULT CALLED TO, READ BACK BY AND VERIFIED WITH: G. ABBOTT,PHARMD 0106 01/23/2019 T. TYSOR    Proteus species NOT DETECTED NOT DETECTED Final   Serratia marcescens NOT DETECTED NOT DETECTED Final   Carbapenem resistance NOT DETECTED NOT DETECTED Final   Haemophilus influenzae NOT DETECTED NOT DETECTED Final   Neisseria meningitidis NOT DETECTED NOT DETECTED Final   Pseudomonas aeruginosa NOT DETECTED NOT DETECTED Final   Candida albicans NOT DETECTED NOT DETECTED Final   Candida glabrata NOT DETECTED NOT DETECTED Final   Candida krusei NOT DETECTED NOT DETECTED Final   Candida parapsilosis NOT  DETECTED NOT DETECTED Final   Candida tropicalis NOT DETECTED NOT DETECTED Final    Comment: Performed at [facility], [place] [address], [place], [place] [address]  MRSA PCR Screening     Status: None   Collection Time: 01/22/19  9:38 AM   Specimen: Nasal Mucosa; Nasopharyngeal  Result Value Ref Range Status   MRSA by PCR NEGATIVE NEGATIVE Final    Comment:        The GeneXpert MRSA Assay (FDA approved for NASAL specimens only), is one component of a comprehensive MRSA colonization surveillance program. It is not intended to diagnose MRSA infection nor to guide or monitor treatment for MRSA infections. Performed at [facility], [place] [address], [place], [place] [address]   Culture, blood (routine x 2)     Status: None (Preliminary result)   Collection Time: 01/24/19  1:58 PM   Specimen: BLOOD  Result Value Ref Range Status   Specimen Description BLOOD SITE NOT SPECIFIED  Final   Special Requests AEROBIC BOTTLE ONLY Blood Culture adequate volume  Final   Culture   Final    NO GROWTH < 24 HOURS Performed at [facility], [place] [address], [place], [place] [address]    Report Status PENDING  Incomplete  Culture, blood (routine x 2)     Status: None (Preliminary result)   Collection Time: 01/24/19  2:04 PM   Specimen: BLOOD RIGHT HAND  Result Value Ref Range Status   Specimen Description BLOOD RIGHT HAND  Final   Special Requests AEROBIC BOTTLE  ONLY Blood Culture adequate volume  Final   Culture   Final    NO GROWTH < 24 HOURS Performed at [facility], [place] [address], [place], [place] [address]    Report Status PENDING  Incomplete         Radiology Studies: No results found.      Scheduled Meds: . acetaminophen  650 mg Oral Q8H  . chlorhexidine  15 mL Mouth Rinse BID  . Chlorhexidine Gluconate Cloth  6 each Topical Daily  . cholecalciferol  2,000 Units Oral BID  . docusate sodium  100 mg Oral BID  . feeding supplement (ENSURE ENLIVE)  237  mL Oral Q1500  . mouth rinse  15 mL Mouth Rinse q12n4p  . midodrine  5 mg Oral TID WC  . multivitamin  1 tablet Oral Daily  . pantoprazole  40 mg Oral Daily  . vitamin C  500 mg Oral Daily   Continuous Infusions: .  ceFAZolin (ANCEF) IV       LOS: 8 days    Phillips Climes, MD [facility]  If 7PM-7AM, please contact night-coverage [email or web address] 01/25/2019, 4:17 PM

## 2019-01-25 NOTE — Progress Notes (Signed)
During shift assessment patient able to state location of [facility] hospital and situation (fell and broke left hip). Able to follow commands, smile, stick out tongue, squeeze grip hands, wiggle toes, plantar flexion. Patient indicated left thigh tender to palpation with no pain at rest.   During 0017 med administration, patient stated he prefers strawberry Ensure and stated choc does not agree with him. Stated when he has a surge of pain then his bowels move. Patient able to engage in conversation, state name and DOB and inquired what was causing bowels to move.  Left hip dressing changed at 0508. Minimal  amount of serosangineous drainage.

## 2019-01-25 NOTE — Progress Notes (Signed)
Physical Therapy Treatment Patient Details Name: GABRIEN FELMAN MRN: [id number] DOB: [birth year] Today's Date: 01/25/2019    History of Present Illness 85 yo male s/p multiple falls at home; imaging of L hip at PCP negative for acute fracture, however pain and immobility worsened and in ED imaging showed acute impacted subcapital fracture of L femoral neck. He received L posterior THR on 01/19/19. PMH OA, BPH, CHF, neuropathy, NICM, A-fib, syncope, hx back surgery, cardiac cath, pacemaker placement, R THR    PT Comments    Pt now on 5W after being transferred to ICU over the weekend.  Posted hip precaution information in room and applied hip abd wedge to pt after therex and bed mobility training. Per chart, he may have a possible hip washout today and pt reports he was hungry and hadn't eaten due to "another surgery".  Pt fatigued from PT session.  Follow Up Recommendations  SNF;Supervision/Assistance - 24 hour     Equipment Recommendations  None recommended by PT    Recommendations for Other Services       Precautions / Restrictions Precautions Precautions: Posterior Hip;Fall Precaution Comments: educated pt in posterior hip precautions and provided handout Required Braces or Orthoses: Other Brace Other Brace: abduction pillow when in bed/chair Restrictions Weight Bearing Restrictions: Yes LLE Weight Bearing: Weight bearing as tolerated    Mobility  Bed Mobility Overal bed mobility: Needs Assistance Bed Mobility: Rolling Rolling: Max assist         General bed mobility comments: MAX A with cues for hand placement and for maintaining hip precautions while NT cleaned him from BM.  Transfers                    Ambulation/Gait                 Stairs             Wheelchair Mobility    Modified Rankin (Stroke Patients Only)       Balance                                            Cognition Arousal/Alertness:  Awake/alert Behavior During Therapy: Flat affect Overall Cognitive Status: Within Functional Limits for tasks assessed                         Following Commands: Follows one step commands with increased time     Problem Solving: Slow processing;Decreased initiation;Difficulty sequencing;Requires verbal cues;Requires tactile cues General Comments: patient extremely HOH, Pt states he gets more confused as the day goes on.  Knew he had hip surgery, but wanted confirmation he wasn't getting a knee replacement      Exercises Total Joint Exercises Ankle Circles/Pumps: AROM;Both;10 reps;Supine Quad Sets: AROM;Left;10 reps;Supine Heel Slides: AAROM;Left;10 reps;Supine Hip ABduction/ADduction: AAROM;Left;10 reps;Supine    General Comments General comments (skin integrity, edema, etc.): Pt now on 5W after going to ICU over the weekend.  Hip precautions written on board for staff and pt left with hip abduction wedge in place, which had not been on upon arrival.      Pertinent Vitals/Pain Pain Assessment: Faces Faces Pain Scale: Hurts little more Pain Location: L LE with movement Pain Descriptors / Indicators: Aching;Guarding Pain Intervention(s): Limited activity within patient's tolerance;Monitored during session;Patient requesting pain meds-RN notified    Home Living  Prior Function            PT Goals (current goals can now be found in the care plan section) Progress towards PT goals: Progressing toward goals    Frequency    Min 2X/week      PT Plan Frequency needs to be updated    Co-evaluation              AM-PAC PT "6 Clicks" Mobility   Outcome Measure  Help needed turning from your back to your side while in a flat bed without using bedrails?: A Lot Help needed moving from lying on your back to sitting on the side of a flat bed without using bedrails?: A Lot Help needed moving to and from a bed to a chair (including a  wheelchair)?: A Lot Help needed standing up from a chair using your arms (e.g., wheelchair or bedside chair)?: A Lot Help needed to walk in hospital room?: Total Help needed climbing 3-5 steps with a railing? : Total 6 Click Score: 10    End of Session   Activity Tolerance: Patient tolerated treatment well Patient left: in bed;with call bell/phone within reach;with bed alarm set;with SCD's reapplied Nurse Communication: Mobility status PT Visit Diagnosis: Unsteadiness on feet (R26.81);History of falling (Z91.81);Difficulty in walking, not elsewhere classified (R26.2);Muscle weakness (generalized) (M62.81);Repeated falls (R29.6);Pain Pain - Right/Left: Left Pain - part of body: Leg     Time: [id number] PT Time Calculation (min) (ACUTE ONLY): 25 min  Charges:  $Therapeutic Exercise: 8-22 mins $Therapeutic Activity: 8-22 mins                     Ichiro Chesnut L. [name], [place] Pager [phone] 01/25/2019    Galen Manila 01/25/2019, 10:05 AM

## 2019-01-25 NOTE — Progress Notes (Signed)
Orthopedic Trauma Service Progress Note  Patient ID: Frank Elliott MRN: [id number] DOB/AGE: 83-06-1925 83 y.o.  Subjective:  Doing ok  States he is hungry No significant L hip pain   Elevated WBC count but is afebrile   ROS As above  Objective:   VITALS:   Vitals:   01/25/19 0144 01/25/19 0412 01/25/19 0446 01/25/19 0837  BP: (!) 135/58  (!) 144/57 (!) 128/58  Pulse: (!) 58  (!) 59 61  Resp: 16  16 16   Temp: 98.2 F (36.8 C)  98 F (36.7 C) 98.3 F (36.8 C)  TempSrc: Oral   Oral  SpO2: 100%  99% 100%  Weight:  97.4 kg    Height:        Estimated body mass index is 31.71 kg/m as calculated from the following:   Height as of this encounter: 5\' 9"  (1.753 m).   Weight as of this encounter: 97.4 kg.   Intake/Output      09/13 0701 - 09/14 0700 09/14 0701 - 09/15 0700   P.O.     I.V. (mL/kg) 0 (0)    IV Piggyback     Total Intake(mL/kg) 0 (0)    Urine (mL/kg/hr) 1775 (0.8)    Stool 0    Total Output 1775    Net -1775         Stool Occurrence 3 x      LABS  Results for orders placed or performed during the hospital encounter of 01/17/19 (from the past 24 hour(s))  CBC     Status: Abnormal   Collection Time: 01/25/19  4:52 AM  Result Value Ref Range   WBC 17.1 (H) 4.0 - 10.5 K/uL   RBC 2.66 (L) 4.22 - 5.81 MIL/uL   Hemoglobin 9.7 (L) 13.0 - 17.0 g/dL   HCT 27.5 (L) 39.0 - 52.0 %   MCV 103.4 (H) 80.0 - 100.0 fL   MCH 36.5 (H) 26.0 - 34.0 pg   MCHC 35.3 30.0 - 36.0 g/dL   RDW 14.6 11.5 - 15.5 %   Platelets 243 150 - 400 K/uL   nRBC 0.0 0.0 - 0.2 %  Basic metabolic panel     Status: Abnormal   Collection Time: 01/25/19  4:52 AM  Result Value Ref Range   Sodium 138 135 - 145 mmol/L   Potassium 4.6 3.5 - 5.1 mmol/L   Chloride 103 98 - 111 mmol/L   CO2 21 (L) 22 - 32 mmol/L   Glucose, Bld 123 (H) 70 - 99 mg/dL   BUN 57 (H) 8 - 23 mg/dL   Creatinine, Ser 1.57 (H) 0.61 - 1.24  mg/dL   Calcium 8.2 (L) 8.9 - 10.3 mg/dL   GFR calc non Af Amer 38 (L) >60 mL/min   GFR calc Af Amer 44 (L) >60 mL/min   Anion gap 14 5 - 15  Procalcitonin     Status: None   Collection Time: 01/25/19  4:52 AM  Result Value Ref Range   Procalcitonin 19.21 ng/mL     PHYSICAL EXAM:    Gen: resting comfortably in bed, NAD  Ext:       Left Lower Extremity   Mild serous drainage on abd pad   No active drainage noted  Moderate ecchymosis around surgical incision   No erythema  along incision line  Expected degree of swelling noted  Ext warm   Distal motor and sensory functions intact       Assessment/Plan: 6 Days Post-Op   Principal Problem:   Fracture of femoral neck, left (HCC) Active Problems:   HTN (hypertension)   Pacemaker   Fall at home, initial encounter   Acute kidney injury superimposed on chronic kidney disease ([place])   Combined systolic and diastolic congestive heart failure (HCC)   Persistent atrial fibrillation   Permanent atrial fibrillation   Preop cardiovascular exam   Acute on chronic combined systolic and diastolic CHF (congestive heart failure) ([place])   Anti-infectives (From admission, onward)   Start     Dose/Rate Route Frequency Ordered Stop   01/23/19 0130  cefTRIAXone (ROCEPHIN) 2 g in sodium chloride 0.9 % 100 mL IVPB     2 g 200 mL/hr over 30 Minutes Intravenous Daily at bedtime 01/23/19 0113     01/22/19 1100  cefTRIAXone (ROCEPHIN) 1 g in sodium chloride 0.9 % 100 mL IVPB  Status:  Discontinued     1 g 200 mL/hr over 30 Minutes Intravenous Every 24 hours 01/22/19 1038 01/23/19 0113   01/19/19 1330  clindamycin (CLEOCIN) IVPB 600 mg     600 mg 100 mL/hr over 30 Minutes Intravenous Every 6 hours 01/19/19 1209 01/19/19 2112   01/19/19 0700  clindamycin (CLEOCIN) IVPB 900 mg  Status:  Discontinued     900 mg 100 mL/hr over 30 Minutes Intravenous On call to O.R. 01/18/19 1058 01/18/19 1121   01/19/19 0630  clindamycin (CLEOCIN) IVPB 600 mg      600 mg 100 mL/hr over 30 Minutes Intravenous  Once 01/18/19 1644 01/19/19 0611   01/18/19 1130  clindamycin (CLEOCIN) IVPB 600 mg  Status:  Discontinued     600 mg 100 mL/hr over 30 Minutes Intravenous On call to O.R. 01/18/19 1126 01/18/19 1644   01/18/19 0600  vancomycin (VANCOCIN) IVPB 1000 mg/200 mL premix  Status:  Discontinued     1,000 mg 200 mL/hr over 60 Minutes Intravenous On call to O.R. 01/17/19 1952 01/18/19 1126    .  POD/HD#: 71  85 y/o male s/p Left femoral neck fracture    - fall    - L femoral neck fracture s/p Left hip hemiarthroplasty              WBAT L leg             Posterior hip precautions               PT/OT              Ice and elevate for swelling control   TED hose                Dressing changed today  Wound looks good   Dressing changes as needed  Ok to clean wound with soap and water only    - Pain management:             continue current management    - ABL anemia/Hemodynamics            stable   - Medical issues              Pert primary team    Septic shock due to UTI/bacteremia (klebsiella)    abx per primary      - DVT/PE prophylaxis:          ok to resume eliquis  from ortho standpoint    - ID:              abx per primary    - Metabolic Bone Disease:             Vitamin d levels are deficient             fx suggestive of osteoporosis   Vitamin d supplementation                Recommend dexa in outpatient setting in 4-8 weeks    - Activity:             Up with assistance             WBAT L leg             Posterior hip precautions left h ip    - FEN/GI prophylaxis/Foley/Lines:             Diet as tolerated   - Impediments to fracture healing:             Low energy fracture             Poor bone quality                     Vitamin d deficiency    - Dispo:             continue with inpatient care              Will likely need snf     Jari Pigg, PA-C [phone] (C) 01/25/2019, 9:02 AM  [facility] [place] [phone] Domingo Sep (F)

## 2019-01-26 LAB — BASIC METABOLIC PANEL
Anion gap: 9 (ref 5–15)
BUN: 46 mg/dL — ABNORMAL HIGH (ref 8–23)
CO2: 25 mmol/L (ref 22–32)
Calcium: 7.9 mg/dL — ABNORMAL LOW (ref 8.9–10.3)
Chloride: 104 mmol/L (ref 98–111)
Creatinine, Ser: 1.33 mg/dL — ABNORMAL HIGH (ref 0.61–1.24)
GFR calc Af Amer: 54 mL/min — ABNORMAL LOW (ref >60)
GFR calc non Af Amer: 47 mL/min — ABNORMAL LOW (ref >60)
Glucose, Bld: 116 mg/dL — ABNORMAL HIGH (ref 70–99)
Potassium: 3.8 mmol/L (ref 3.5–5.1)
Sodium: 138 mmol/L (ref 135–145)

## 2019-01-26 LAB — CBC
HCT: 26.8 % — ABNORMAL LOW (ref 39.0–52.0)
Hemoglobin: 9.4 g/dL — ABNORMAL LOW (ref 13.0–17.0)
MCH: 34.3 pg — ABNORMAL HIGH (ref 26.0–34.0)
MCHC: 35.1 g/dL (ref 30.0–36.0)
MCV: 97.8 fL (ref 80.0–100.0)
Platelets: 245 10*3/uL (ref 150–400)
RBC: 2.74 MIL/uL — ABNORMAL LOW (ref 4.22–5.81)
RDW: 14.4 % (ref 11.5–15.5)
WBC: 13.3 10*3/uL — ABNORMAL HIGH (ref 4.0–10.5)
nRBC: 0 % (ref 0.0–0.2)

## 2019-01-26 LAB — PROCALCITONIN: Procalcitonin: 10.75 ng/mL

## 2019-01-26 MED ORDER — ENSURE ENLIVE PO LIQD
237.0000 mL | Freq: Every day | ORAL | Status: DC
  Administered 2019-01-26: 237 mL via ORAL

## 2019-01-26 MED ORDER — AMIODARONE HCL 200 MG PO TABS
200.0000 mg | ORAL_TABLET | Freq: Every day | ORAL | Status: DC
  Administered 2019-01-26 – 2019-01-28 (×3): 200 mg via ORAL
  Filled 2019-01-26 (×3): qty 1

## 2019-01-26 MED ORDER — APIXABAN 5 MG PO TABS
5.0000 mg | ORAL_TABLET | Freq: Two times a day (BID) | ORAL | Status: DC
  Administered 2019-01-26: 23:00:00 5 mg via ORAL
  Filled 2019-01-26: qty 1

## 2019-01-26 MED ORDER — FUROSEMIDE 10 MG/ML IJ SOLN
40.0000 mg | Freq: Every day | INTRAMUSCULAR | Status: DC
  Administered 2019-01-26 – 2019-01-28 (×3): 40 mg via INTRAVENOUS
  Filled 2019-01-26 (×3): qty 4

## 2019-01-26 NOTE — Progress Notes (Signed)
0140 notifed by other RN patient stated itching all over; 0147 RN to bedside. Patient stated itching and said needed to be washed up. Upon inquiry patient stated a bath might help with itching. Upon skin assessment no rash observed, scattered redness and bruising generalized over extremities and face.   Paged NP Baltazar Najjar at [phone]. FYI: pt. reported itching all over,no rash observed by RN.pt. indicated bath might help with itching. Vistaril ordered.   0412 RN to bedside patient complaining of pain in left hip. Offered pain meds, patient stated pain med had not been working, however, patient had not received any prior pain meds during night.pain meds given and informed patient if did not work there are other pain med options.

## 2019-01-26 NOTE — Progress Notes (Signed)
Nutrition Follow-up  DOCUMENTATION CODES:   Not applicable  INTERVENTION:   -Continue 500 mg vitamin C daily -Continue MVI daily -Continue Ensure Enlive po BID, each supplement provides 350 kcal and 20 grams of protein  NUTRITION DIAGNOSIS:   Increased nutrient needs related to post-op healing as evidenced by estimated needs.  Ongoing  GOAL:   Patient will meet greater than or equal to 90% of their needs  Progressing   MONITOR:   PO intake, Supplement acceptance, Skin, Weight trends, Labs, I & O's  REASON FOR ASSESSMENT:   Consult Assessment of nutrition requirement/status, Hip fracture protocol  ASSESSMENT:   85 year old with a history of essential hypertension, combined diastolic/systolic CHF ef [id number] grade 2 diastolic dysfunction, coronary artery disease, persistent atrial fibrillation on Eliquis, heart block status post pacemaker, BPH came with left hip pain after fall. CT showed left acute impacted subcapital femoral neck fracture.  PROCEDURE (9/8): ARTHROPLASTYUNIPOLAR HIP (HEMIARTHROPLASTY) (Left)   Reviewed I/O's: -1.6 L x 24 hours and -3.7 L since admission  UOP: 1.7 L x 24 hours  Pt sleeping in bed at time of visit. Observed pt breakfast tray; consumed 75% of meal (all except cup of coffee and bowl of grits). Pt with good appetite; meal completion 50-100%. Pt has been consuming Ensure supplements per MAR.   Per CSW notes, awaiting SNF placement at discharge.   Labs reviewed.   Diet Order:   Diet Order            Diet Heart Room service appropriate? No; Fluid consistency: Thin  Diet effective now              EDUCATION NEEDS:   Not appropriate for education at this time  Skin:  Skin Assessment: Skin Integrity Issues: Skin Integrity Issues:: Incisions Incisions: L hip  Last BM:  01/25/19  Height:   Ht Readings from Last 1 Encounters:  01/24/19 5\' 9"  (1.753 m)    Weight:   Wt Readings from Last 1 Encounters:  01/25/19 97.4 kg     Ideal Body Weight:  72.7 kg  BMI:  Body mass index is 31.71 kg/m.  Estimated Nutritional Needs:   Kcal:  [id number]  Protein:  80-90 grams  Fluid:  1.7 - 1.9 L/day    Tierney Behl A. Jimmye Norman, RD, LDN, [place] Registered Dietitian II Certified Diabetes Care and Education Specialist Pager: [phone] After hours Pager: [phone]

## 2019-01-26 NOTE — TOC Progression Note (Addendum)
Transition of Care [facility]) - Progression Note    Patient Details  Name: Frank Elliott MRN: [id number] Date of Birth: Jun 02, 1925  Transition of [facility]) CM/SW [place], LCSW Phone Number: 01/26/2019, 10:01 AM  Clinical Narrative:    4pm-Carol at [facility] provided fax number and will review the referral. [facility] does not accept short term rehab patients unless they are private pay.   10am-CSW spoke with patient's son, [name], to provide SNF bed offers. He reported preference for 1-[facility] (not accepting new patients) 2-[facility] (CSW left voicemail for admissions)  3-[facility] (no beds available) 4- [facility] (Left vm for admissions) 5-[name] (no beds available yet)   Expected Discharge Plan: [place] Barriers to Discharge: Continued Medical Work up  Expected Discharge Plan and Services Expected Discharge Plan: [place] In-house Referral: Clinical Social Work Discharge Planning Services: NA Post Acute Care Choice: [place] Living arrangements for the past 2 months: [place]                 DME Arranged: N/A DME Agency: NA       HH Arranged: NA [place] Agency: NA         Social Determinants of Health (SDOH) Interventions    Readmission Risk Interventions No flowsheet data found.

## 2019-01-26 NOTE — Progress Notes (Signed)
PROGRESS NOTE    Frank Elliott  [id number] DOB: 1925-06-30 DOA: 01/17/2019 PCP: [name], MD   Brief Narrative:   85 year old with a history of essential hypertension, combined diastolic/systolic CHF ef [id number] grade 2 diastolic dysfunction, coronary artery disease, persistent atrial fibrillation on Eliquis, heart block status post pacemaker, BPH came with left hip pain.  Had a fall about a week prior to admission from a stationary bike.  Outpatient x-rays with PCP were negative but due to persistence of pain he came to the hospital.  CT showed left acute impacted subcapital femoral neck fracture.  Patient status post surgical repair by Dr. Marcelino Scot 01/17/2019,  he is on anticoagulation for A. fib, initially used to be on heparin drip, transitioned to Eliquis 01/19/2019, postoperative hospital course was complicated by acute blood loss anemia, where he required 2 units PRBC transfusion on 01/21/2019, as well worsening renal failure, with most recent creatinine of 2.7, felt secondary to hypotension, 9/11 a.m., patient with worsening mental status, hypotensive, transferred to ICU for pressors, work-up significant for septic shock due to bacteremia/UTI, required pressors for 36 hours, currently improvement will be transferred out of ICU .  Subjective:  Patient denies any complaints today, no chest pain, no shortness of breath, he remains afebrile  Assessment & Plan: 1   Principal Problem:   Fracture of femoral neck, left (HCC) Active Problems:   HTN (hypertension)   Pacemaker   Fall at home, initial encounter   Acute kidney injury superimposed on chronic kidney disease ([place])   Combined systolic and diastolic congestive heart failure (HCC)   Persistent atrial fibrillation   Permanent atrial fibrillation   Preop cardiovascular exam   Acute on chronic combined systolic and diastolic CHF (congestive heart failure) ([place])   Septic shock due to UTI/Klebsiella bacteremia - on 9/11, patient  hypotensive, altered mental status (unresponsive) with elevated lactic acid, leukocytosis, and significantly elevated procalcitonin at 57. -Secondary to Klebsiella on dopamine drip initially, currently being tapered off. -recieved IV hydrocortisone -related to the Klebsiella bacteremia. -Treated with IV Rocephin initially, currently transitioned to Ancef ,will need total of 2 weeks of antibiotics treatment, to be kept on IV antibiotics during hospital stay, and was ready for discharge can be discharged on the quinolones to finish total of 2 weeks, repeat blood cultures to document clearance of infection .discussed with Dr. Prince Rome. -Leukocytosis trending down, procalcitonin trending down as well which is reassuring  Klebsiella bacteremia -This is most likely due to UTI , unfortunately urine cultures were not sent, to finish total of 2 weeks,  - discussed with orthopedic to check postoperative wounds, it has been checked, no evidence of discharge  Acute metabolic encephalopathy -CT head with no acute finding, this is most likely in the setting of sepsis, appears to be improving today, more communicative and awake.  Acute impacted subcapital fracture of the left femoral neck, -Outpatient x-rays negative.  Confirmed fracture on the CT -Pain control, incentive spirometer, bowel regimen - Patient underwent operative management on 9/8 by Dr Marcelino Scot. -Seen by physical therapy with recommendations for SNF   Chronic combined congestive heart failure with reduced ejection fraction, [id number], grade 2 diastolic dysfunction, class III -  Cardiology consulted.  Strict input and output.  Daily weight -Patient appears to be significantly volume overloaded today, especially he received copious amount of IV fluids during his septic shock, he is started on IV diuresis, will continue with 40 mg IV today, likely will need to increase Renal function and blood pressure  stable.  Acute kidney injury on CKD stage III -  Baseline creatinine 1.3, elevated at admission to 1.6, did peak at 2.7,most likely in the setting of ATN due to sepsis and hypotension . -Improving  Acute blood loss anemia -Baseline hemoglobin around 13, did drop to 8, he received 2 units PRBC 9/10 .  Hemoglobin remained stable  Persistent atrial fibrillation on chronic anticoagulation -Eliquis was on hold for surgery, heparin drip initially, currently back on Eliquis -Continue with amiodarone for heart rate control, I have stopped Coreg given soft blood pressure  Essential hypertension -continue to hold losartan especially with AKI, continue to hold Coreg given low blood pressure .  Coronary artery disease -Currently chest pain-free.  Continue home meds  History of heart block status post [facility] Jude dual-chamber pacemaker -Cardiology is seeing the patient.  GERD -PPI   DVT prophylaxis: Heparin drip>>Eliquis Code Status: DNR Family communication: D/W with son via phone 9/14 Disposition Plan: Will need SNF when medically stable.  Consultants:   Orthopedic  Cardiology  PCCM     Objective: Vitals:   01/25/19 2341 01/26/19 0426 01/26/19 0800 01/26/19 1255  BP: (!) 143/62 (!) 130/57 122/63 (!) 132/56  Pulse:  (!) 59 (!) 57 60  Resp:  14 16 18   Temp:  97.9 F (36.6 C) 97.8 F (36.6 C)   TempSrc:  Oral Oral   SpO2: 99% 97% 97% 98%  Weight:      Height:        Intake/Output Summary (Last 24 hours) at 01/26/2019 1610 Last data filed at 01/26/2019 1100 Gross per 24 hour  Intake 340 ml  Output 2050 ml  Net -1710 ml   Filed Weights   01/24/19 0416 01/24/19 2116 01/25/19 0412  Weight: 80.9 kg 94.7 kg 97.4 kg    Examination:  Awake Alert, extremely hard of hearing, follows simple commands, answering yes and no questions appropriately Symmetrical Chest wall movement, Good air movement bilaterally, CTAB RRR,No Gallops,Rubs or new Murmurs, No Parasternal Heave +ve B.Sounds, Abd Soft, No tenderness, No rebound -  guarding or rigidity. No Cyanosis, Clubbing ,+2 edema, No new Rash or bruise    Data Reviewed:   CBC: Recent Labs  Lab 01/22/19 0808  01/23/19 0424 01/23/19 0514 01/24/19 0306 01/25/19 0452 01/26/19 0253  WBC 11.5*  --  21.4*  --  16.0* 17.1* 13.3*  HGB 10.0*   < > 9.1* 8.5* 9.2* 9.7* 9.4*  HCT 27.4*   < > 25.9* 25.0* 25.5* 27.5* 26.8*  MCV 98.6  --  97.4  --  100.0 103.4* 97.8  PLT 182  --  223  --  219 243 245   < > = values in this interval not displayed.   Basic Metabolic Panel: Recent Labs  Lab 01/20/19 0525 01/21/19 0412 01/22/19 0646  01/22/19 1634 01/23/19 0424 01/23/19 0514 01/24/19 0306 01/25/19 0452 01/26/19 0253  NA 137 134* 136   < > 138 136 138 138 138 138  K 4.8 4.7 4.5   < > 4.9 4.4 4.4 4.5 4.6 3.8  CL 101 98 99  --  105 103  --  104 103 104  CO2 27 24 25   --  24 24  --  25 21* 25  GLUCOSE 129* 115* 96  --  126* 114*  --  119* 123* 116*  BUN 56* 73* 74*  --  69* 73*  --  66* 57* 46*  CREATININE 2.11* 2.77* 2.72*  --  2.27* 2.28*  --  1.76* 1.57* 1.33*  CALCIUM 8.0* 7.6* 7.8*  --  7.5* 7.9*  --  8.0* 8.2* 7.9*  MG 2.2 2.2 2.1  --   --  2.3  --   --   --   --   PHOS  --   --   --   --   --  3.7  --   --   --   --    < > = values in this interval not displayed.   GFR: Estimated Creatinine Clearance: 42.5 mL/min (A) (by C-G formula based on SCr of 1.33 mg/dL (H)). Liver Function Tests: No results for input(s): AST, ALT, ALKPHOS, BILITOT, PROT, ALBUMIN in the last 168 hours. No results for input(s): LIPASE, AMYLASE in the last 168 hours. Recent Labs  Lab 01/22/19 0746  AMMONIA 14   Coagulation Profile: No results for input(s): INR, PROTIME in the last 168 hours. Cardiac Enzymes: No results for input(s): CKTOTAL, CKMB, CKMBINDEX, TROPONINI in the last 168 hours. BNP (last 3 results) Recent Labs    10/08/18 1419  PROBNP 3,397*   HbA1C: No results for input(s): HGBA1C in the last 72 hours. CBG: Recent Labs  Lab 01/22/19 2343 01/23/19 0435  01/23/19 0735 01/23/19 1143 01/23/19 1606  GLUCAP 111* 111* 105* 104* 124*   Lipid Profile: No results for input(s): CHOL, HDL, LDLCALC, TRIG, CHOLHDL, LDLDIRECT in the last 72 hours. Thyroid Function Tests: No results for input(s): TSH, T4TOTAL, FREET4, T3FREE, THYROIDAB in the last 72 hours. Anemia Panel: No results for input(s): VITAMINB12, FOLATE, FERRITIN, TIBC, IRON, RETICCTPCT in the last 72 hours. Sepsis Labs: Recent Labs  Lab 01/22/19 0744 01/22/19 1045 01/23/19 0424 01/24/19 0306 01/25/19 0452 01/26/19 0253  PROCALCITON 37.51  --  57.02 37.97 19.21 10.75  LATICACIDVEN 2.8* 2.1*  --   --   --   --     Recent Results (from the past 240 hour(s))  SARS Coronavirus 2 [facility] order, Performed in [facility] hospital lab) Nasopharyngeal Nasopharyngeal Swab     Status: None   Collection Time: 01/17/19  3:45 PM   Specimen: Nasopharyngeal Swab  Result Value Ref Range Status   SARS Coronavirus 2 NEGATIVE NEGATIVE Final    Comment: (NOTE) If result is NEGATIVE SARS-CoV-2 target nucleic acids are NOT DETECTED. The SARS-CoV-2 RNA is generally detectable in upper and lower  respiratory specimens during the acute phase of infection. The lowest  concentration of SARS-CoV-2 viral copies this assay can detect is 250  copies / mL. A negative result does not preclude SARS-CoV-2 infection  and should not be used as the sole basis for treatment or other  patient management decisions.  A negative result may occur with  improper specimen collection / handling, submission of specimen other  than nasopharyngeal swab, presence of viral mutation(s) within the  areas targeted by this assay, and inadequate number of viral copies  (<250 copies / mL). A negative result must be combined with clinical  observations, patient history, and epidemiological information. If result is POSITIVE SARS-CoV-2 target nucleic acids are DETECTED. The SARS-CoV-2 RNA is generally detectable in upper and lower   respiratory specimens dur ing the acute phase of infection.  Positive  results are indicative of active infection with SARS-CoV-2.  Clinical  correlation with patient history and other diagnostic information is  necessary to determine patient infection status.  Positive results do  not rule out bacterial infection or co-infection with other viruses. If result is PRESUMPTIVE POSTIVE SARS-CoV-2 nucleic acids MAY BE  PRESENT.   A presumptive positive result was obtained on the submitted specimen  and confirmed on repeat testing.  While 2019 novel coronavirus  (SARS-CoV-2) nucleic acids may be present in the submitted sample  additional confirmatory testing may be necessary for epidemiological  and / or clinical management purposes  to differentiate between  SARS-CoV-2 and other Sarbecovirus currently known to infect humans.  If clinically indicated additional testing with an alternate test  methodology [phone]) is advised. The SARS-CoV-2 RNA is generally  detectable in upper and lower respiratory sp ecimens during the acute  phase of infection. The expected result is Negative. Fact Sheet for Patients:  StrictlyIdeas.no Fact Sheet for Healthcare Providers: BankingDealers.co.za This test is not yet approved or cleared by the [place] FDA and has been authorized for detection and/or diagnosis of SARS-CoV-2 by FDA under an Emergency Use Authorization (EUA).  This EUA will remain in effect (meaning this test can be used) for the duration of the COVID-19 declaration under Section 564(b)(1) of the Act, 21 U.S.C. section 360bbb-3(b)(1), unless the authorization is terminated or revoked sooner. Performed at [facility], [place] [address], [place], [place] [address]   Surgical PCR screen     Status: None   Collection Time: 01/17/19  9:56 PM   Specimen: Nasal Mucosa; Nasal Swab  Result Value Ref Range Status   MRSA, PCR NEGATIVE NEGATIVE  Final   Staphylococcus aureus NEGATIVE NEGATIVE Final    Comment: (NOTE) The Xpert SA Assay (FDA approved for NASAL specimens in patients 18 years of age and older), is one component of a comprehensive surveillance program. It is not intended to diagnose infection nor to guide or monitor treatment. Performed at [facility], [place] [address], [place], [place] [address]   Culture, blood (routine x 2)     Status: None (Preliminary result)   Collection Time: 01/22/19  5:35 AM   Specimen: BLOOD  Result Value Ref Range Status   Specimen Description BLOOD RIGHT HAND  Final   Special Requests   Final    BOTTLES DRAWN AEROBIC ONLY Blood Culture results may not be optimal due to an inadequate volume of blood received in culture bottles   Culture   Final    NO GROWTH 4 DAYS Performed at [facility], [place] [address] [place], [place] [address]    Report Status PENDING  Incomplete  Culture, blood (routine x 2)     Status: Abnormal   Collection Time: 01/22/19  6:45 AM   Specimen: BLOOD LEFT ARM  Result Value Ref Range Status   Specimen Description BLOOD LEFT ARM  Final   Special Requests   Final    BOTTLES DRAWN AEROBIC ONLY Blood Culture adequate volume   Culture  Setup Time   Final    GRAM NEGATIVE RODS AEROBIC BOTTLE ONLY CRITICAL RESULT CALLED TO, READ BACK BY AND VERIFIED WITH: G. ABBOTT,PHARMD 0106 01/23/2019 Mena Goes Performed at [facility], [place] [address] [place], [place] [address]    Culture KLEBSIELLA PNEUMONIAE (A)  Final   Report Status 01/24/2019 FINAL  Final   Organism ID, Bacteria KLEBSIELLA PNEUMONIAE  Final      Susceptibility   Klebsiella pneumoniae - MIC*    AMPICILLIN RESISTANT Resistant     CEFAZOLIN <=4 SENSITIVE Sensitive     CEFEPIME <=1 SENSITIVE Sensitive     CEFTAZIDIME <=1 SENSITIVE Sensitive     CEFTRIAXONE <=1 SENSITIVE Sensitive     CIPROFLOXACIN <=0.25 SENSITIVE  Sensitive     GENTAMICIN <=1 SENSITIVE Sensitive     IMIPENEM <=0.25  SENSITIVE Sensitive     TRIMETH/SULFA <=20 SENSITIVE Sensitive     AMPICILLIN/SULBACTAM 4 SENSITIVE Sensitive     PIP/TAZO <=4 SENSITIVE Sensitive     Extended ESBL NEGATIVE Sensitive     * KLEBSIELLA PNEUMONIAE  Blood Culture ID Panel (Reflexed)     Status: Abnormal   Collection Time: 01/22/19  6:45 AM  Result Value Ref Range Status   Enterococcus species NOT DETECTED NOT DETECTED Final   Listeria monocytogenes NOT DETECTED NOT DETECTED Final   Staphylococcus species NOT DETECTED NOT DETECTED Final   Staphylococcus aureus (BCID) NOT DETECTED NOT DETECTED Final   Streptococcus species NOT DETECTED NOT DETECTED Final   Streptococcus agalactiae NOT DETECTED NOT DETECTED Final   Streptococcus pneumoniae NOT DETECTED NOT DETECTED Final   Streptococcus pyogenes NOT DETECTED NOT DETECTED Final   Acinetobacter baumannii NOT DETECTED NOT DETECTED Final   Enterobacteriaceae species DETECTED (A) NOT DETECTED Final    Comment: Enterobacteriaceae represent a large family of gram-negative bacteria, not a single organism. CRITICAL RESULT CALLED TO, READ BACK BY AND VERIFIED WITH: G. ABBOTT,PHARMD 0106 01/23/2019 T. TYSOR    Enterobacter cloacae complex NOT DETECTED NOT DETECTED Final   Escherichia coli NOT DETECTED NOT DETECTED Final   Klebsiella oxytoca NOT DETECTED NOT DETECTED Final   Klebsiella pneumoniae DETECTED (A) NOT DETECTED Final    Comment: CRITICAL RESULT CALLED TO, READ BACK BY AND VERIFIED WITH: G. ABBOTT,PHARMD 0106 01/23/2019 T. TYSOR    Proteus species NOT DETECTED NOT DETECTED Final   Serratia marcescens NOT DETECTED NOT DETECTED Final   Carbapenem resistance NOT DETECTED NOT DETECTED Final   Haemophilus influenzae NOT DETECTED NOT DETECTED Final   Neisseria meningitidis NOT DETECTED NOT DETECTED Final   Pseudomonas aeruginosa NOT DETECTED NOT DETECTED Final   Candida albicans NOT DETECTED NOT DETECTED Final   Candida glabrata NOT DETECTED NOT DETECTED Final   Candida  krusei NOT DETECTED NOT DETECTED Final   Candida parapsilosis NOT DETECTED NOT DETECTED Final   Candida tropicalis NOT DETECTED NOT DETECTED Final    Comment: Performed at [facility], [place], [place], [place] [address]  MRSA PCR Screening     Status: None   Collection Time: 01/22/19  9:38 AM   Specimen: Nasal Mucosa; Nasopharyngeal  Result Value Ref Range Status   MRSA by PCR NEGATIVE NEGATIVE Final    Comment:        The GeneXpert MRSA Assay (FDA approved for NASAL specimens only), is one component of a comprehensive MRSA colonization surveillance program. It is not intended to diagnose MRSA infection nor to guide or monitor treatment for MRSA infections. Performed at [facility], [place] [address] [place], [place] [address]   Culture, blood (routine x 2)     Status: None (Preliminary result)   Collection Time: 01/24/19  1:58 PM   Specimen: BLOOD  Result Value Ref Range Status   Specimen Description BLOOD SITE NOT SPECIFIED  Final   Special Requests AEROBIC BOTTLE ONLY Blood Culture adequate volume  Final   Culture   Final    NO GROWTH 2 DAYS Performed at [facility], [place] [address] [place], [place] [address]    Report Status PENDING  Incomplete  Culture, blood (routine x 2)     Status: None (Preliminary result)   Collection Time: 01/24/19  2:04 PM   Specimen: BLOOD RIGHT HAND  Result Value Ref Range Status   Specimen Description BLOOD RIGHT HAND  Final   Special Requests AEROBIC BOTTLE ONLY Blood Culture adequate volume  Final   Culture   Final    NO GROWTH 2 DAYS Performed at [facility], [address] [place], [place] [address]    Report Status PENDING  Incomplete         Radiology Studies: No results found.      Scheduled Meds: . acetaminophen  650 mg Oral Q8H  . amiodarone  200 mg Oral Daily  . apixaban  5 mg Oral BID  . chlorhexidine  15 mL Mouth Rinse BID  . Chlorhexidine Gluconate Cloth  6 each Topical Daily   . cholecalciferol  2,000 Units Oral BID  . docusate sodium  100 mg Oral BID  . feeding supplement (ENSURE ENLIVE)  237 mL Oral Q1500  . mouth rinse  15 mL Mouth Rinse q12n4p  . midodrine  5 mg Oral TID WC  . multivitamin  1 tablet Oral Daily  . pantoprazole  40 mg Oral Daily  . vitamin C  500 mg Oral Daily   Continuous Infusions: .  ceFAZolin (ANCEF) IV 2 g (01/26/19 1437)     LOS: 9 days    Phillips Climes, MD [facility]  If 7PM-7AM, please contact night-coverage [email or web address] 01/26/2019, 4:10 PM

## 2019-01-27 LAB — BASIC METABOLIC PANEL
Anion gap: 9 (ref 5–15)
BUN: 44 mg/dL — ABNORMAL HIGH (ref 8–23)
CO2: 27 mmol/L (ref 22–32)
Calcium: 8 mg/dL — ABNORMAL LOW (ref 8.9–10.3)
Chloride: 102 mmol/L (ref 98–111)
Creatinine, Ser: 1.54 mg/dL — ABNORMAL HIGH (ref 0.61–1.24)
GFR calc Af Amer: 45 mL/min — ABNORMAL LOW (ref >60)
GFR calc non Af Amer: 39 mL/min — ABNORMAL LOW (ref >60)
Glucose, Bld: 109 mg/dL — ABNORMAL HIGH (ref 70–99)
Potassium: 4 mmol/L (ref 3.5–5.1)
Sodium: 138 mmol/L (ref 135–145)

## 2019-01-27 LAB — CBC
HCT: 28.3 % — ABNORMAL LOW (ref 39.0–52.0)
Hemoglobin: 10.1 g/dL — ABNORMAL LOW (ref 13.0–17.0)
MCH: 35.6 pg — ABNORMAL HIGH (ref 26.0–34.0)
MCHC: 35.7 g/dL (ref 30.0–36.0)
MCV: 99.6 fL (ref 80.0–100.0)
Platelets: 302 10*3/uL (ref 150–400)
RBC: 2.84 MIL/uL — ABNORMAL LOW (ref 4.22–5.81)
RDW: 14.4 % (ref 11.5–15.5)
WBC: 13 10*3/uL — ABNORMAL HIGH (ref 4.0–10.5)
nRBC: 0.2 % (ref 0.0–0.2)

## 2019-01-27 LAB — CULTURE, BLOOD (ROUTINE X 2): Culture: NO GROWTH

## 2019-01-27 LAB — NOVEL CORONAVIRUS, NAA (HOSP ORDER, SEND-OUT TO REF LAB; TAT 18-24 HRS): SARS-CoV-2, NAA: NOT DETECTED

## 2019-01-27 LAB — PROCALCITONIN: Procalcitonin: 6.16 ng/mL

## 2019-01-27 MED ORDER — APIXABAN 2.5 MG PO TABS
2.5000 mg | ORAL_TABLET | Freq: Two times a day (BID) | ORAL | Status: DC
  Administered 2019-01-27 – 2019-01-28 (×3): 2.5 mg via ORAL
  Filled 2019-01-27 (×3): qty 1

## 2019-01-27 MED ORDER — HYDROXYZINE HCL 25 MG PO TABS
12.5000 mg | ORAL_TABLET | Freq: Once | ORAL | Status: AC
  Administered 2019-01-27: 12.5 mg via ORAL
  Filled 2019-01-27: qty 1

## 2019-01-27 NOTE — Progress Notes (Signed)
Occupational Therapy Treatment Patient Details Name: Frank Elliott MRN: [id number] DOB: 07-Dec-1925 Today's Date: 01/27/2019    History of present illness 85 yo male s/p multiple falls at home; imaging of L hip at PCP negative for acute fracture, however pain and immobility worsened and in ED imaging showed acute impacted subcapital fracture of L femoral neck. He received L posterior THR on 01/19/19. PMH OA, BPH, CHF, neuropathy, NICM, A-fib, syncope, hx back surgery, cardiac cath, pacemaker placement, R THR   OT comments  Pt making slow progress towards OT goals this session. Pt found supine in bed incontinent of urine with condom catheter off. Pt able to roll for clean up with MAX A +2. Pt lethargic and unable to recall why pt was in hospital. Pt requires increased time and tactile cues to initiate rolling. DC plan remains appropriate. Will continue to follow for acute OT needs.   Follow Up Recommendations  SNF;Supervision/Assistance - 24 hour    Equipment Recommendations  Other (comment)(tbd to next venue)    Recommendations for Other Services      Precautions / Restrictions Precautions Precautions: Posterior Hip;Fall Precaution Booklet Issued: Yes (comment) Precaution Comments: educated pt on posterior hip precautions but pt unable to recall even after explaination Required Braces or Orthoses: Other Brace Other Brace: abduction pillow when in bed/chair Restrictions Weight Bearing Restrictions: Yes LLE Weight Bearing: Weight bearing as tolerated       Mobility Bed Mobility Overal bed mobility: Needs Assistance Bed Mobility: Rolling Rolling: Max assist         General bed mobility comments: MAX A +2 for rolling  Transfers                 General transfer comment: unable to transfer this date d/t pain and incontient urine episode    Balance Overall balance assessment: Needs assistance                                         ADL either  performed or assessed with clinical judgement   ADL Overall ADL's : Needs assistance/impaired                             Toileting- Clothing Manipulation and Hygiene: Maximal assistance;+2 for physical assistance Toileting - Clothing Manipulation Details (indicate cue type and reason): pt found incontient of urine with condom cath off; MAX to clean pt up     Functional mobility during ADLs: Maximal assistance General ADL Comments: session limited to rolling this session d/t pt being saturated with urine     Vision Patient Visual Report: No change from baseline     Perception     Praxis      Cognition Arousal/Alertness: Awake/alert Behavior During Therapy: Flat affect Overall Cognitive Status: Within Functional Limits for tasks assessed Area of Impairment: Following commands;Problem solving;Attention;Orientation                 Orientation Level: Disoriented to;Situation Current Attention Level: Focused   Following Commands: Follows one step commands with increased time;Follows one step commands inconsistently     Problem Solving: Slow processing;Decreased initiation;Difficulty sequencing;Requires verbal cues;Requires tactile cues General Comments: unable to recall what happened to him but aware of name, DOB, and location; keeps stating " I don't know" when asked questions;        Exercises  Shoulder Instructions       General Comments      Pertinent Vitals/ Pain       Pain Assessment: Faces Faces Pain Scale: Hurts little more Pain Location: everywhere Pain Descriptors / Indicators: Aching;Guarding;Discomfort;Moaning Pain Intervention(s): Limited activity within patient's tolerance;Monitored during session;Repositioned  Home Living                                          Prior Functioning/Environment              Frequency  Min 2X/week        Progress Toward Goals  OT Goals(current goals can now be found  in the care plan section)  Progress towards OT goals: Progressing toward goals  Acute Rehab OT Goals Time For Goal Achievement: 02/03/19 Potential to Achieve Goals: Good  Plan Discharge plan remains appropriate    Co-evaluation                 AM-PAC OT "6 Clicks" Daily Activity     Outcome Measure   Help from another person eating meals?: A Little Help from another person taking care of personal grooming?: A Lot Help from another person toileting, which includes using toliet, bedpan, or urinal?: Total Help from another person bathing (including washing, rinsing, drying)?: A Lot Help from another person to put on and taking off regular upper body clothing?: A Lot Help from another person to put on and taking off regular lower body clothing?: Total 6 Click Score: 11    End of Session Equipment Utilized During Treatment: Other (comment)(abd wedge)  OT Visit Diagnosis: Unsteadiness on feet (R26.81);Other abnormalities of gait and mobility (R26.89);Pain;Other symptoms and signs involving cognitive function;History of falling (Z91.81);Muscle weakness (generalized) (M62.81)   Activity Tolerance Patient limited by lethargy;Patient limited by pain   Patient Left in bed;with call bell/phone within reach;with bed alarm set   Nurse Communication Mobility status;Other (comment)(needs to be cleaned up)        Time: [id number] OT Time Calculation (min): 16 min  Charges: OT General Charges $OT Visit: 1 Visit OT Treatments $Self Care/Home Management : 8-22 mins  [place], [place] [facility] [phone] [place] 01/27/2019, 1:57 PM

## 2019-01-27 NOTE — Progress Notes (Signed)
PROGRESS NOTE    Frank Elliott  [id number] DOB: 1925/08/07 DOA: 01/17/2019 PCP: [name], MD   Brief Narrative:   85 year old with a history of essential hypertension, combined diastolic/systolic CHF ef [id number] grade 2 diastolic dysfunction, coronary artery disease, persistent atrial fibrillation on Eliquis, heart block status post pacemaker, BPH came with left hip pain.  Had a fall about a week prior to admission from a stationary bike.  Outpatient x-rays with PCP were negative but due to persistence of pain he came to the hospital.  CT showed left acute impacted subcapital femoral neck fracture.  Patient status post surgical repair by Dr. Marcelino Scot 01/17/2019,  he is on anticoagulation for A. fib, initially used to be on heparin drip, transitioned to Eliquis 01/19/2019, postoperative hospital course was complicated by acute blood loss anemia, where he required 2 units PRBC transfusion on 01/21/2019, as well worsening renal failure, with most recent creatinine of 2.7, felt secondary to hypotension, 9/11 a.m., patient with worsening mental status, hypotensive, transferred to ICU for pressors, work-up significant for septic shock due to bacteremia/UTI, required pressors for 36 hours, currently improvement will be transferred out of ICU .  Subjective:  Patient denies any fever or chills, but reports he is feeling "bad" today, weak, but no chest pain or shortness of breath .   Assessment & Plan: 1   Principal Problem:   Fracture of femoral neck, left (HCC) Active Problems:   HTN (hypertension)   Pacemaker   Fall at home, initial encounter   Acute kidney injury superimposed on chronic kidney disease ([place])   Combined systolic and diastolic congestive heart failure (HCC)   Persistent atrial fibrillation   Permanent atrial fibrillation   Preop cardiovascular exam   Acute on chronic combined systolic and diastolic CHF (congestive heart failure) ([place])   Septic shock due to UTI/Klebsiella  bacteremia - on 9/11, patient hypotensive, altered mental status (unresponsive) with elevated lactic acid, leukocytosis, and significantly elevated procalcitonin at 57. -Secondary to Klebsiella on dopamine drip initially, currently being tapered off. -recieved IV hydrocortisone -related to the Klebsiella bacteremia. -Treated with IV Rocephin initially, currently transitioned to Ancef ,will need total of 2 weeks of antibiotics treatment, to be kept on IV antibiotics during hospital stay, and was ready for discharge can be discharged on the quinolones to finish total of 2 weeks, repeat blood cultures to document clearance of infection .discussed with Dr. Prince Rome. -Leukocytosis continue to trend down, procalcitonin trending down as well, which is reassuring  Klebsiella bacteremia -This is most likely due to UTI , unfortunately urine cultures were not sent, to finish total of 2 weeks,  - discussed with orthopedic to check postoperative wounds, it has been checked, no evidence of discharge  Acute metabolic encephalopathy -CT head with no acute finding, this is most likely in the setting of sepsis, resolved, mentation back to baseline  Acute impacted subcapital fracture of the left femoral neck, -Outpatient x-rays negative.  Confirmed fracture on the CT -Pain control, incentive spirometer, bowel regimen - Patient underwent operative management on 9/8 by Dr Marcelino Scot. -Seen by physical therapy with recommendations for SNF   Chronic combined congestive heart failure with reduced ejection fraction, EF [id number], grade 2 diastolic dysfunction, class III -  Cardiology consulted.  Strict input and output.  Daily weight -Patient appears to be significantly volume overloaded today, especially he received copious amount of IV fluids during his septic shock, volume status improving with IV diuresis, monitor renal function closely and blood pressure .  Acute  kidney injury on CKD stage III - Baseline creatinine 1.3,  elevated at admission to 1.6, did peak at 2.7,most likely in the setting of ATN due to sepsis and hypotension . -Improving  Acute blood loss anemia -Baseline hemoglobin around 13, did drop to 8, he received 2 units PRBC 9/10 .  Hemoglobin remained stable  Persistent atrial fibrillation on chronic anticoagulation -Eliquis was on hold for surgery, heparin drip initially, currently back on Eliquis -Continue with amiodarone for heart rate control, I have stopped Coreg given soft blood pressure  Essential hypertension -continue to hold losartan especially with AKI, continue to hold Coreg given low blood pressure .  Coronary artery disease -Currently chest pain-free.  Continue home meds  History of heart block status post [facility] Jude dual-chamber pacemaker -Cardiology is seeing the patient.  GERD -PPI   DVT prophylaxis: Heparin drip>>Eliquis Code Status: DNR Family communication: D/W with son via phone 9/14 Disposition Plan: Will need SNF when medically stable.  Consultants:   Orthopedic  Cardiology  PCCM     Objective: Vitals:   01/26/19 2211 01/27/19 0555 01/27/19 0900 01/27/19 1423  BP: 136/62 (!) 129/59 137/69 140/60  Pulse: (!) 58 60 60 60  Resp: 18 19  17   Temp: 98.2 F (36.8 C) 98.6 F (37 C)  98.3 F (36.8 C)  TempSrc: Oral   Oral  SpO2: 99% 94% 98% 100%  Weight:      Height:        Intake/Output Summary (Last 24 hours) at 01/27/2019 1506 Last data filed at 01/27/2019 1424 Gross per 24 hour  Intake 620 ml  Output 825 ml  Net -205 ml   Filed Weights   01/24/19 0416 01/24/19 2116 01/25/19 0412  Weight: 80.9 kg 94.7 kg 97.4 kg    Examination:  Awake Alert, frail, extremely hard of hearing Symmetrical Chest wall movement, Good air movement bilaterally, CTAB RRR,No Gallops,Rubs or new Murmurs, No Parasternal Heave +ve B.Sounds, Abd Soft, No tenderness, No rebound - guarding or rigidity. No Cyanosis, Clubbing or edema, No new Rash or bruise      Data Reviewed:   CBC: Recent Labs  Lab 01/23/19 0424 01/23/19 0514 01/24/19 0306 01/25/19 0452 01/26/19 0253 01/27/19 0252  WBC 21.4*  --  16.0* 17.1* 13.3* 13.0*  HGB 9.1* 8.5* 9.2* 9.7* 9.4* 10.1*  HCT 25.9* 25.0* 25.5* 27.5* 26.8* 28.3*  MCV 97.4  --  100.0 103.4* 97.8 99.6  PLT 223  --  219 243 245 [id number]   Basic Metabolic Panel: Recent Labs  Lab 01/21/19 0412 01/22/19 0646  01/23/19 0424 01/23/19 0514 01/24/19 0306 01/25/19 0452 01/26/19 0253 01/27/19 0252  NA 134* 136   < > 136 138 138 138 138 138  K 4.7 4.5   < > 4.4 4.4 4.5 4.6 3.8 4.0  CL 98 99   < > 103  --  104 103 104 102  CO2 24 25   < > 24  --  25 21* 25 27  GLUCOSE 115* 96   < > 114*  --  119* 123* 116* 109*  BUN 73* 74*   < > 73*  --  66* 57* 46* 44*  CREATININE 2.77* 2.72*   < > 2.28*  --  1.76* 1.57* 1.33* 1.54*  CALCIUM 7.6* 7.8*   < > 7.9*  --  8.0* 8.2* 7.9* 8.0*  MG 2.2 2.1  --  2.3  --   --   --   --   --  PHOS  --   --   --  3.7  --   --   --   --   --    < > = values in this interval not displayed.   GFR: Estimated Creatinine Clearance: 36.7 mL/min (A) (by C-G formula based on SCr of 1.54 mg/dL (H)). Liver Function Tests: No results for input(s): AST, ALT, ALKPHOS, BILITOT, PROT, ALBUMIN in the last 168 hours. No results for input(s): LIPASE, AMYLASE in the last 168 hours. Recent Labs  Lab 01/22/19 0746  AMMONIA 14   Coagulation Profile: No results for input(s): INR, PROTIME in the last 168 hours. Cardiac Enzymes: No results for input(s): CKTOTAL, CKMB, CKMBINDEX, TROPONINI in the last 168 hours. BNP (last 3 results) Recent Labs    10/08/18 1419  PROBNP 3,397*   HbA1C: No results for input(s): HGBA1C in the last 72 hours. CBG: Recent Labs  Lab 01/22/19 2343 01/23/19 0435 01/23/19 0735 01/23/19 1143 01/23/19 1606  GLUCAP 111* 111* 105* 104* 124*   Lipid Profile: No results for input(s): CHOL, HDL, LDLCALC, TRIG, CHOLHDL, LDLDIRECT in the last 72 hours. Thyroid Function  Tests: No results for input(s): TSH, T4TOTAL, FREET4, T3FREE, THYROIDAB in the last 72 hours. Anemia Panel: No results for input(s): VITAMINB12, FOLATE, FERRITIN, TIBC, IRON, RETICCTPCT in the last 72 hours. Sepsis Labs: Recent Labs  Lab 01/22/19 0744 01/22/19 1045  01/24/19 0306 01/25/19 0452 01/26/19 0253 01/27/19 0252  PROCALCITON 37.51  --    < > 37.97 19.21 10.75 6.16  LATICACIDVEN 2.8* 2.1*  --   --   --   --   --    < > = values in this interval not displayed.    Recent Results (from the past 240 hour(s))  SARS Coronavirus 2 [facility] order, Performed in [facility] hospital lab) Nasopharyngeal Nasopharyngeal Swab     Status: None   Collection Time: 01/17/19  3:45 PM   Specimen: Nasopharyngeal Swab  Result Value Ref Range Status   SARS Coronavirus 2 NEGATIVE NEGATIVE Final    Comment: (NOTE) If result is NEGATIVE SARS-CoV-2 target nucleic acids are NOT DETECTED. The SARS-CoV-2 RNA is generally detectable in upper and lower  respiratory specimens during the acute phase of infection. The lowest  concentration of SARS-CoV-2 viral copies this assay can detect is 250  copies / mL. A negative result does not preclude SARS-CoV-2 infection  and should not be used as the sole basis for treatment or other  patient management decisions.  A negative result may occur with  improper specimen collection / handling, submission of specimen other  than nasopharyngeal swab, presence of viral mutation(s) within the  areas targeted by this assay, and inadequate number of viral copies  (<250 copies / mL). A negative result must be combined with clinical  observations, patient history, and epidemiological information. If result is POSITIVE SARS-CoV-2 target nucleic acids are DETECTED. The SARS-CoV-2 RNA is generally detectable in upper and lower  respiratory specimens dur ing the acute phase of infection.  Positive  results are indicative of active infection with SARS-CoV-2.  Clinical   correlation with patient history and other diagnostic information is  necessary to determine patient infection status.  Positive results do  not rule out bacterial infection or co-infection with other viruses. If result is PRESUMPTIVE POSTIVE SARS-CoV-2 nucleic acids MAY BE PRESENT.   A presumptive positive result was obtained on the submitted specimen  and confirmed on repeat testing.  While 2019 novel coronavirus  (SARS-CoV-2) nucleic acids  may be present in the submitted sample  additional confirmatory testing may be necessary for epidemiological  and / or clinical management purposes  to differentiate between  SARS-CoV-2 and other Sarbecovirus currently known to infect humans.  If clinically indicated additional testing with an alternate test  methodology [phone]) is advised. The SARS-CoV-2 RNA is generally  detectable in upper and lower respiratory sp ecimens during the acute  phase of infection. The expected result is Negative. Fact Sheet for Patients:  StrictlyIdeas.no Fact Sheet for Healthcare Providers: BankingDealers.co.za This test is not yet approved or cleared by the [place] FDA and has been authorized for detection and/or diagnosis of SARS-CoV-2 by FDA under an Emergency Use Authorization (EUA).  This EUA will remain in effect (meaning this test can be used) for the duration of the COVID-19 declaration under Section 564(b)(1) of the Act, 21 U.S.C. section 360bbb-3(b)(1), unless the authorization is terminated or revoked sooner. Performed at [facility], [place] [address] [place], [place] [address]   Surgical PCR screen     Status: None   Collection Time: 01/17/19  9:56 PM   Specimen: Nasal Mucosa; Nasal Swab  Result Value Ref Range Status   MRSA, PCR NEGATIVE NEGATIVE Final   Staphylococcus aureus NEGATIVE NEGATIVE Final    Comment: (NOTE) The Xpert SA Assay (FDA approved for NASAL specimens in patients 32  years of age and older), is one component of a comprehensive surveillance program. It is not intended to diagnose infection nor to guide or monitor treatment. Performed at [facility], [place] [address] [place], [place] [address]   Culture, blood (routine x 2)     Status: None   Collection Time: 01/22/19  5:35 AM   Specimen: BLOOD  Result Value Ref Range Status   Specimen Description BLOOD RIGHT HAND  Final   Special Requests   Final    BOTTLES DRAWN AEROBIC ONLY Blood Culture results may not be optimal due to an inadequate volume of blood received in culture bottles   Culture   Final    NO GROWTH 5 DAYS Performed at [facility], [place] [address], [place], [place] [address]    Report Status 01/27/2019 FINAL  Final  Culture, blood (routine x 2)     Status: Abnormal   Collection Time: 01/22/19  6:45 AM   Specimen: BLOOD LEFT ARM  Result Value Ref Range Status   Specimen Description BLOOD LEFT ARM  Final   Special Requests   Final    BOTTLES DRAWN AEROBIC ONLY Blood Culture adequate volume   Culture  Setup Time   Final    GRAM NEGATIVE RODS AEROBIC BOTTLE ONLY CRITICAL RESULT CALLED TO, READ BACK BY AND VERIFIED WITH: G. ABBOTT,PHARMD 0106 01/23/2019 Mena Goes Performed at [facility], [place] [address] [place], [place] [address]    Culture KLEBSIELLA PNEUMONIAE (A)  Final   Report Status 01/24/2019 FINAL  Final   Organism ID, Bacteria KLEBSIELLA PNEUMONIAE  Final      Susceptibility   Klebsiella pneumoniae - MIC*    AMPICILLIN RESISTANT Resistant     CEFAZOLIN <=4 SENSITIVE Sensitive     CEFEPIME <=1 SENSITIVE Sensitive     CEFTAZIDIME <=1 SENSITIVE Sensitive     CEFTRIAXONE <=1 SENSITIVE Sensitive     CIPROFLOXACIN <=0.25 SENSITIVE Sensitive     GENTAMICIN <=1 SENSITIVE Sensitive     IMIPENEM <=0.25 SENSITIVE Sensitive     TRIMETH/SULFA <=20 SENSITIVE Sensitive  AMPICILLIN/SULBACTAM 4 SENSITIVE Sensitive     PIP/TAZO <=4 SENSITIVE Sensitive     Extended  ESBL NEGATIVE Sensitive     * KLEBSIELLA PNEUMONIAE  Blood Culture ID Panel (Reflexed)     Status: Abnormal   Collection Time: 01/22/19  6:45 AM  Result Value Ref Range Status   Enterococcus species NOT DETECTED NOT DETECTED Final   Listeria monocytogenes NOT DETECTED NOT DETECTED Final   Staphylococcus species NOT DETECTED NOT DETECTED Final   Staphylococcus aureus (BCID) NOT DETECTED NOT DETECTED Final   Streptococcus species NOT DETECTED NOT DETECTED Final   Streptococcus agalactiae NOT DETECTED NOT DETECTED Final   Streptococcus pneumoniae NOT DETECTED NOT DETECTED Final   Streptococcus pyogenes NOT DETECTED NOT DETECTED Final   Acinetobacter baumannii NOT DETECTED NOT DETECTED Final   Enterobacteriaceae species DETECTED (A) NOT DETECTED Final    Comment: Enterobacteriaceae represent a large family of gram-negative bacteria, not a single organism. CRITICAL RESULT CALLED TO, READ BACK BY AND VERIFIED WITH: G. ABBOTT,PHARMD 0106 01/23/2019 T. TYSOR    Enterobacter cloacae complex NOT DETECTED NOT DETECTED Final   Escherichia coli NOT DETECTED NOT DETECTED Final   Klebsiella oxytoca NOT DETECTED NOT DETECTED Final   Klebsiella pneumoniae DETECTED (A) NOT DETECTED Final    Comment: CRITICAL RESULT CALLED TO, READ BACK BY AND VERIFIED WITH: G. ABBOTT,PHARMD 0106 01/23/2019 T. TYSOR    Proteus species NOT DETECTED NOT DETECTED Final   Serratia marcescens NOT DETECTED NOT DETECTED Final   Carbapenem resistance NOT DETECTED NOT DETECTED Final   Haemophilus influenzae NOT DETECTED NOT DETECTED Final   Neisseria meningitidis NOT DETECTED NOT DETECTED Final   Pseudomonas aeruginosa NOT DETECTED NOT DETECTED Final   Candida albicans NOT DETECTED NOT DETECTED Final   Candida glabrata NOT DETECTED NOT DETECTED Final   Candida krusei NOT DETECTED NOT DETECTED Final   Candida parapsilosis NOT DETECTED NOT DETECTED Final   Candida tropicalis NOT DETECTED NOT DETECTED Final    Comment:  Performed at [facility], [place]. [address], [place], [place] [address]  MRSA PCR Screening     Status: None   Collection Time: 01/22/19  9:38 AM   Specimen: Nasal Mucosa; Nasopharyngeal  Result Value Ref Range Status   MRSA by PCR NEGATIVE NEGATIVE Final    Comment:        The GeneXpert MRSA Assay (FDA approved for NASAL specimens only), is one component of a comprehensive MRSA colonization surveillance program. It is not intended to diagnose MRSA infection nor to guide or monitor treatment for MRSA infections. Performed at [facility], [place] [address], [place], [place] [address]   Culture, blood (routine x 2)     Status: None (Preliminary result)   Collection Time: 01/24/19  1:58 PM   Specimen: BLOOD  Result Value Ref Range Status   Specimen Description BLOOD SITE NOT SPECIFIED  Final   Special Requests AEROBIC BOTTLE ONLY Blood Culture adequate volume  Final   Culture   Final    NO GROWTH 3 DAYS Performed at [facility], [place] [address] [place], [place] [address]    Report Status PENDING  Incomplete  Culture, blood (routine x 2)     Status: None (Preliminary result)   Collection Time: 01/24/19  2:04 PM   Specimen: BLOOD RIGHT HAND  Result Value Ref Range Status   Specimen Description BLOOD RIGHT HAND  Final   Special Requests AEROBIC BOTTLE ONLY Blood Culture adequate volume  Final  Culture   Final    NO GROWTH 3 DAYS Performed at [facility], [place] [address] [place], [place] [address]    Report Status PENDING  Incomplete  Novel Coronavirus, NAA (hospital order; send-out to ref lab)     Status: None   Collection Time: 01/26/19  1:15 PM   Specimen: Nasopharyngeal Swab; Respiratory  Result Value Ref Range Status   SARS-CoV-2, NAA NOT DETECTED NOT DETECTED Final    Comment: (NOTE) This nucleic acid amplification test was developed and its performance characteristics determined by [employer]. Nucleic acid amplification tests include  PCR and TMA. This test has not been FDA cleared or approved. This test has been authorized by FDA under an Emergency Use Authorization (EUA). This test is only authorized for the duration of time the declaration that circumstances exist justifying the authorization of the emergency use of in vitro diagnostic tests for detection of SARS-CoV-2 virus and/or diagnosis of COVID-19 infection under section 564(b)(1) of the Act, 21 U.S.C. [id number]) (1), unless the authorization is terminated or revoked sooner. When diagnostic testing is negative, the possibility of a false negative result should be considered in the context of a patient's recent exposures and the presence of clinical signs and symptoms consistent with COVID-19. An individual without symptoms of COVID- 19 and who is not shedding SARS-CoV-2 vi rus would expect to have a negative (not detected) result in this assay. Performed At: [facility] [address] [place], [place] [id number] Rush Farmer MD [id number]    [place]  Final    Comment: Performed at [facility], [place] [address] [place], [place] [address]         Radiology Studies: No results found.      Scheduled Meds: . acetaminophen  650 mg Oral Q8H  . amiodarone  200 mg Oral Daily  . apixaban  2.5 mg Oral BID  . chlorhexidine  15 mL Mouth Rinse BID  . Chlorhexidine Gluconate Cloth  6 each Topical Daily  . cholecalciferol  2,000 Units Oral BID  . docusate sodium  100 mg Oral BID  . feeding supplement (ENSURE ENLIVE)  237 mL Oral Q1500  . furosemide  40 mg Intravenous Daily  . mouth rinse  15 mL Mouth Rinse q12n4p  . midodrine  5 mg Oral TID WC  . multivitamin  1 tablet Oral Daily  . pantoprazole  40 mg Oral Daily  . vitamin C  500 mg Oral Daily   Continuous Infusions: .  ceFAZolin (ANCEF) IV 2 g (01/27/19 1352)     LOS: 10 days    Phillips Climes, MD [facility]  If 7PM-7AM, please contact  night-coverage [email or web address] 01/27/2019, 3:06 PM

## 2019-01-27 NOTE — Progress Notes (Signed)
Per Tele Pt had 8 beat run wide SVT. Pt appeared in no distress, sitting in bed eating meal. HR in 60s Vpaced. MD paged. Will continue to monitor.

## 2019-01-27 NOTE — TOC Progression Note (Signed)
Transition of Care [facility]) - Progression Note    Patient Details  Name: Frank Elliott MRN: [id number] Date of Birth: 12/17/1925  [facility] CM/SW [place], LCSW Phone Number: 01/27/2019, 3:47 PM  Clinical Narrative:    CSW went over bed offers with patient's son, [name]. [facility] no longer has a bed available but [name] is able to accept pt. [name] would like to accept the bed offer at [facility]. [name] aware and will prepare for patient to be discharged tomorrow. COVID negative has resulted.    Expected Discharge Plan: [place] Barriers to Discharge: Continued Medical Work up  Expected Discharge Plan and Services Expected Discharge Plan: [place] In-house Referral: Clinical Social Work Discharge Planning Services: NA Post Acute Care Choice: [place] Living arrangements for the past 2 months: [place]                 DME Arranged: N/A DME Agency: NA       HH Arranged: NA [place] Agency: NA         Social Determinants of Health (SDOH) Interventions    Readmission Risk Interventions No flowsheet data found.

## 2019-01-28 DIAGNOSIS — N189 Chronic kidney disease, unspecified: Secondary | ICD-10-CM | POA: Diagnosis not present

## 2019-01-28 DIAGNOSIS — Z111 Encounter for screening for respiratory tuberculosis: Secondary | ICD-10-CM | POA: Diagnosis not present

## 2019-01-28 DIAGNOSIS — N179 Acute kidney failure, unspecified: Secondary | ICD-10-CM | POA: Diagnosis not present

## 2019-01-28 DIAGNOSIS — R21 Rash and other nonspecific skin eruption: Secondary | ICD-10-CM | POA: Diagnosis not present

## 2019-01-28 DIAGNOSIS — N390 Urinary tract infection, site not specified: Secondary | ICD-10-CM | POA: Diagnosis not present

## 2019-01-28 DIAGNOSIS — R319 Hematuria, unspecified: Secondary | ICD-10-CM | POA: Diagnosis not present

## 2019-01-28 DIAGNOSIS — J449 Chronic obstructive pulmonary disease, unspecified: Secondary | ICD-10-CM | POA: Diagnosis not present

## 2019-01-28 DIAGNOSIS — R1312 Dysphagia, oropharyngeal phase: Secondary | ICD-10-CM | POA: Diagnosis not present

## 2019-01-28 DIAGNOSIS — N401 Enlarged prostate with lower urinary tract symptoms: Secondary | ICD-10-CM | POA: Diagnosis not present

## 2019-01-28 DIAGNOSIS — S72002D Fracture of unspecified part of neck of left femur, subsequent encounter for closed fracture with routine healing: Secondary | ICD-10-CM | POA: Diagnosis not present

## 2019-01-28 DIAGNOSIS — K5900 Constipation, unspecified: Secondary | ICD-10-CM | POA: Diagnosis not present

## 2019-01-28 DIAGNOSIS — G4709 Other insomnia: Secondary | ICD-10-CM | POA: Diagnosis not present

## 2019-01-28 DIAGNOSIS — R41841 Cognitive communication deficit: Secondary | ICD-10-CM | POA: Diagnosis not present

## 2019-01-28 DIAGNOSIS — Z7401 Bed confinement status: Secondary | ICD-10-CM | POA: Diagnosis not present

## 2019-01-28 DIAGNOSIS — N183 Chronic kidney disease, stage 3 (moderate): Secondary | ICD-10-CM | POA: Diagnosis not present

## 2019-01-28 DIAGNOSIS — R338 Other retention of urine: Secondary | ICD-10-CM | POA: Diagnosis not present

## 2019-01-28 DIAGNOSIS — M79672 Pain in left foot: Secondary | ICD-10-CM | POA: Diagnosis not present

## 2019-01-28 DIAGNOSIS — J3089 Other allergic rhinitis: Secondary | ICD-10-CM | POA: Diagnosis not present

## 2019-01-28 DIAGNOSIS — S72002G Fracture of unspecified part of neck of left femur, subsequent encounter for closed fracture with delayed healing: Secondary | ICD-10-CM | POA: Diagnosis not present

## 2019-01-28 DIAGNOSIS — S7292XA Unspecified fracture of left femur, initial encounter for closed fracture: Secondary | ICD-10-CM | POA: Diagnosis not present

## 2019-01-28 DIAGNOSIS — M1380 Other specified arthritis, unspecified site: Secondary | ICD-10-CM | POA: Diagnosis not present

## 2019-01-28 DIAGNOSIS — I2510 Atherosclerotic heart disease of native coronary artery without angina pectoris: Secondary | ICD-10-CM | POA: Diagnosis not present

## 2019-01-28 DIAGNOSIS — I10 Essential (primary) hypertension: Secondary | ICD-10-CM | POA: Diagnosis not present

## 2019-01-28 DIAGNOSIS — E569 Vitamin deficiency, unspecified: Secondary | ICD-10-CM | POA: Diagnosis not present

## 2019-01-28 DIAGNOSIS — N139 Obstructive and reflux uropathy, unspecified: Secondary | ICD-10-CM | POA: Diagnosis not present

## 2019-01-28 DIAGNOSIS — S72012D Unspecified intracapsular fracture of left femur, subsequent encounter for closed fracture with routine healing: Secondary | ICD-10-CM | POA: Diagnosis not present

## 2019-01-28 DIAGNOSIS — I4819 Other persistent atrial fibrillation: Secondary | ICD-10-CM | POA: Diagnosis not present

## 2019-01-28 DIAGNOSIS — G8911 Acute pain due to trauma: Secondary | ICD-10-CM | POA: Diagnosis not present

## 2019-01-28 DIAGNOSIS — W19XXXA Unspecified fall, initial encounter: Secondary | ICD-10-CM | POA: Diagnosis not present

## 2019-01-28 DIAGNOSIS — R609 Edema, unspecified: Secondary | ICD-10-CM | POA: Diagnosis not present

## 2019-01-28 DIAGNOSIS — M6281 Muscle weakness (generalized): Secondary | ICD-10-CM | POA: Diagnosis not present

## 2019-01-28 DIAGNOSIS — R2689 Other abnormalities of gait and mobility: Secondary | ICD-10-CM | POA: Diagnosis not present

## 2019-01-28 DIAGNOSIS — I5042 Chronic combined systolic (congestive) and diastolic (congestive) heart failure: Secondary | ICD-10-CM | POA: Diagnosis not present

## 2019-01-28 DIAGNOSIS — I959 Hypotension, unspecified: Secondary | ICD-10-CM | POA: Diagnosis not present

## 2019-01-28 DIAGNOSIS — M80052D Age-related osteoporosis with current pathological fracture, left femur, subsequent encounter for fracture with routine healing: Secondary | ICD-10-CM | POA: Diagnosis not present

## 2019-01-28 DIAGNOSIS — M25572 Pain in left ankle and joints of left foot: Secondary | ICD-10-CM | POA: Diagnosis not present

## 2019-01-28 DIAGNOSIS — Z9181 History of falling: Secondary | ICD-10-CM | POA: Diagnosis not present

## 2019-01-28 DIAGNOSIS — Z950 Presence of cardiac pacemaker: Secondary | ICD-10-CM | POA: Diagnosis not present

## 2019-01-28 DIAGNOSIS — R112 Nausea with vomiting, unspecified: Secondary | ICD-10-CM | POA: Diagnosis not present

## 2019-01-28 DIAGNOSIS — R7881 Bacteremia: Secondary | ICD-10-CM | POA: Diagnosis not present

## 2019-01-28 DIAGNOSIS — I4891 Unspecified atrial fibrillation: Secondary | ICD-10-CM | POA: Diagnosis not present

## 2019-01-28 DIAGNOSIS — D500 Iron deficiency anemia secondary to blood loss (chronic): Secondary | ICD-10-CM | POA: Diagnosis not present

## 2019-01-28 DIAGNOSIS — I5043 Acute on chronic combined systolic (congestive) and diastolic (congestive) heart failure: Secondary | ICD-10-CM | POA: Diagnosis not present

## 2019-01-28 DIAGNOSIS — R52 Pain, unspecified: Secondary | ICD-10-CM | POA: Diagnosis not present

## 2019-01-28 DIAGNOSIS — R339 Retention of urine, unspecified: Secondary | ICD-10-CM | POA: Diagnosis not present

## 2019-01-28 DIAGNOSIS — K219 Gastro-esophageal reflux disease without esophagitis: Secondary | ICD-10-CM | POA: Diagnosis not present

## 2019-01-28 DIAGNOSIS — I5040 Unspecified combined systolic (congestive) and diastolic (congestive) heart failure: Secondary | ICD-10-CM | POA: Diagnosis not present

## 2019-01-28 DIAGNOSIS — M2550 Pain in unspecified joint: Secondary | ICD-10-CM | POA: Diagnosis not present

## 2019-01-28 LAB — BASIC METABOLIC PANEL
Anion gap: 9 (ref 5–15)
BUN: 42 mg/dL — ABNORMAL HIGH (ref 8–23)
CO2: 26 mmol/L (ref 22–32)
Calcium: 7.7 mg/dL — ABNORMAL LOW (ref 8.9–10.3)
Chloride: 101 mmol/L (ref 98–111)
Creatinine, Ser: 1.51 mg/dL — ABNORMAL HIGH (ref 0.61–1.24)
GFR calc Af Amer: 46 mL/min — ABNORMAL LOW (ref >60)
GFR calc non Af Amer: 40 mL/min — ABNORMAL LOW (ref >60)
Glucose, Bld: 107 mg/dL — ABNORMAL HIGH (ref 70–99)
Potassium: 3.7 mmol/L (ref 3.5–5.1)
Sodium: 136 mmol/L (ref 135–145)

## 2019-01-28 MED ORDER — TAMSULOSIN HCL 0.4 MG PO CAPS: 0.4000 mg | ORAL_CAPSULE | Freq: Every day | ORAL | Status: AC

## 2019-01-28 MED ORDER — ENSURE ENLIVE PO LIQD: 237.0000 mL | Freq: Every day | ORAL | 12 refills | Status: DC

## 2019-01-28 MED ORDER — SENNOSIDES-DOCUSATE SODIUM 8.6-50 MG PO TABS
3.0000 | ORAL_TABLET | Freq: Two times a day (BID) | ORAL | Status: DC
  Administered 2019-01-28: 3 via ORAL
  Filled 2019-01-28: qty 3

## 2019-01-28 MED ORDER — CEPHALEXIN 500 MG PO CAPS: 500.0000 mg | ORAL_CAPSULE | Freq: Three times a day (TID) | ORAL | 0 refills | Status: AC

## 2019-01-28 MED ORDER — APIXABAN 2.5 MG PO TABS: 2.5000 mg | ORAL_TABLET | Freq: Two times a day (BID) | ORAL | Status: AC

## 2019-01-28 MED ORDER — SENNOSIDES-DOCUSATE SODIUM 8.6-50 MG PO TABS: 3.0000 | ORAL_TABLET | Freq: Two times a day (BID) | ORAL | Status: DC

## 2019-01-28 MED ORDER — HYDROCODONE-ACETAMINOPHEN 5-325 MG PO TABS: 2.0000 | ORAL_TABLET | Freq: Four times a day (QID) | ORAL | 0 refills | Status: DC | PRN

## 2019-01-28 MED ORDER — BISACODYL 5 MG PO TBEC
10.0000 mg | DELAYED_RELEASE_TABLET | Freq: Once | ORAL | Status: AC
  Administered 2019-01-28: 10 mg via ORAL
  Filled 2019-01-28: qty 2

## 2019-01-28 MED ORDER — VITAMIN D3 25 MCG PO TABS: 2000.0000 [IU] | ORAL_TABLET | Freq: Two times a day (BID) | ORAL | Status: AC

## 2019-01-28 MED ORDER — CARVEDILOL 3.125 MG PO TABS: 3.1250 mg | ORAL_TABLET | Freq: Two times a day (BID) | ORAL | 11 refills | Status: DC

## 2019-01-28 MED ORDER — POLYETHYLENE GLYCOL 3350 17 G PO PACK
17.0000 g | PACK | Freq: Once | ORAL | Status: AC
  Administered 2019-01-28: 13:00:00 17 g via ORAL
  Filled 2019-01-28: qty 1

## 2019-01-28 NOTE — Progress Notes (Signed)
Nsg Discharge Note  Report given to [facility] at [place]. Patient to be transferred to [facility] where he will await [facility] for transportation.   Admit Date:  01/17/2019 Discharge date: 01/28/2019   [facility]er Plan to be D/C'd Rehab per MD order.  AVS completed.  Copy for chart, and copy for patient signed, and dated. Patient/caregiver able to verbalize understanding.  Discharge Medication: Allergies as of 01/28/2019      Reactions   Aspirin Anaphylaxis, Hives, Swelling   Whiskey [alcohol] Anaphylaxis, Hives   Amoxicillin Other (See Comments)   Causes sore throat   Avelox [moxifloxacin Hcl In Nacl] Diarrhea, Other (See Comments)   Constipation, also   Lisinopril Cough   Sulfa Antibiotics Hives   Tape Itching   Also causes redness--Paper tape okay   Zithromax [azithromycin] Other (See Comments)   Sore throat   Penicillins Rash   Did it involve swelling of the face/tongue/throat, SOB, or low BP? Yes Did it involve sudden or severe rash/hives, skin peeling, or any reaction on the inside of your mouth or nose? Unk Did you need to seek medical attention at a hospital or doctor's office? Unk When did it last happen?Years ago If all above answers are "NO", may proceed with cephalosporin use.      Medication List    STOP taking these medications   gabapentin 300 MG capsule Commonly known as: NEURONTIN   losartan 100 MG tablet Commonly known as: COZAAR   traMADol 50 MG tablet Commonly known as: ULTRAM     TAKE these medications   ALIGN PO Take 1 capsule by mouth daily.   amiodarone 200 MG tablet Commonly known as: PACERONE Take 1 tablet (200 mg total) twice a day for one month and then reduce to one tablet daily What changed:   how much to take  how to take this  when to take this  additional instructions   apixaban 2.5 MG Tabs tablet Commonly known as: ELIQUIS Take 1 tablet (2.5 mg total) by mouth 2 (two) times daily. What changed:   medication strength  how much to  take   betamethasone dipropionate 0.05 % cream Commonly known as: DIPROLENE Apply 1 application topically 2 (two) times daily as needed (as directed).   calcitonin (salmon) 200 UNIT/ACT nasal spray Commonly known as: MIACALCIN/FORTICAL Place 1 spray into alternate nostrils every evening. 7 PM   carvedilol 3.125 MG tablet Commonly known as: Coreg Take 1 tablet (3.125 mg total) by mouth 2 (two) times daily. What changed:   medication strength  how much to take   cephALEXin 500 MG capsule Commonly known as: KEFLEX Take 1 capsule (500 mg total) by mouth 3 (three) times daily for 7 days.   feeding supplement (ENSURE ENLIVE) Liqd Take 237 mLs by mouth daily at 3 pm.   fluticasone 50 MCG/ACT nasal spray Commonly known as: FLONASE Place 1 spray into both nostrils daily as needed for allergies.   furosemide 20 MG tablet Commonly known as: LASIX Take as directed 1 tablet tues, wed, fri-Sunday 2 tablets on mon & thursdays May have 1 extra tablet daily as needed for swelling What changed:   how much to take  how to take this  when to take this  additional instructions   HYDROcodone-acetaminophen 5-325 MG tablet Commonly known as: NORCO/VICODIN Take 2 tablets by mouth every 6 (six) hours as needed for moderate pain.   ketoconazole 2 % cream Commonly known as: NIZORAL Apply 1 application topically See admin instructions. Apply as  directed daily   omeprazole 20 MG capsule Commonly known as: PRILOSEC Take 20 mg by mouth daily before breakfast.   senna-docusate 8.6-50 MG tablet Commonly known as: Senokot-S Take 3 tablets by mouth 2 (two) times daily.   tamsulosin 0.4 MG Caps capsule Commonly known as: Flomax Take 1 capsule (0.4 mg total) by mouth daily after supper.   VITAMIN B-12 PO Take 1 tablet by mouth daily.   Vitamin D3 25 MCG tablet Commonly known as: Vitamin D Take 2 tablets (2,000 Units total) by mouth 2 (two) times daily.       Discharge  Assessment: Vitals:   01/27/19 2123 01/28/19 0444  BP: (!) 120/56 (!) 118/53  Pulse: (!) 59 60  Resp: 14 17  Temp: 98.6 F (37 C) 98.6 F (37 C)  SpO2: 99% 95%   Skin clean, dry and intact without evidence of skin break down, no evidence of skin tears noted. IV catheter discontinued intact. Site without signs and symptoms of complications - no redness or edema noted at insertion site, patient denies c/o pain - only slight tenderness at site.  Dressing with slight pressure applied.  D/c Instructions-Education: Discharge instructions given to patient/family with verbalized understanding. D/c education completed with patient/family including follow up instructions, medication list, d/c activities limitations if indicated, with other d/c instructions as indicated by MD - patient able to verbalize understanding, all questions fully answered. Patient instructed to return to ED, call 911, or call MD for any changes in condition.  Patient escorted via [place], and D/C home via private auto.  Erasmo Leventhal, RN 01/28/2019 2:11 PM

## 2019-01-28 NOTE — Care Management Important Message (Signed)
Important Message  Patient Details  Name: Frank Elliott MRN: [id number] Date of Birth: 1925/10/23   Medicare Important Message Given:  Yes     [name] [address] 01/28/2019, 12:51 PM

## 2019-01-28 NOTE — Discharge Summary (Signed)
Frank Elliott, is a 85 y.o. male  DOB [birth year]  MRN [id number].  Admission date:  01/17/2019  Admitting Physician  Frank Morton, MD  Discharge Date:  01/28/2019   Primary MD  Frank Melter, MD  Recommendations for primary care physician for things to follow:  -Please check CBC, BMP in 3 to 5 days . -To follow with orthopedic in 1 week as outpatient -Patient will need voiding trial when he is more ambulatory.  CODE STATUS: DNR  ERecommendation from orthopedic: - L femoral neck fracture s/p Left hip hemiarthroplasty  WBAT L leg Posterior hip precautions   PT/OT  Ice and elevate for swelling control              TED hose   Dressing changed today             Wound looks good              Dressing changes as needed             Ok to clean wound with soap and water only   Admission Diagnosis  Peripheral edema [R60.9] Left displaced femoral neck fracture ([place]) [S72.002A] Closed left hip fracture, initial encounter ([place]) [S72.002A]   Discharge Diagnosis  Peripheral edema [R60.9] Left displaced femoral neck fracture ([place]) [S72.002A] Closed left hip fracture, initial encounter ([place]) [S72.002A]    Principal Problem:   Fracture of femoral neck, left ([place]) Active Problems:   HTN (hypertension)   Pacemaker   Fall at home, initial encounter   Acute kidney injury superimposed on chronic kidney disease ([place])   Combined systolic and diastolic congestive heart failure (HCC)   Persistent atrial fibrillation   Permanent atrial fibrillation   Preop cardiovascular exam   Acute on chronic combined systolic and diastolic CHF (congestive heart failure) ([place])      Past Medical History:  Diagnosis Date   Arthritis    "just about all over" (02/05/2018)   AV block, [place] 2 02/03/2018   PPM placed 01/2018   BPH (benign prostatic  hyperplasia)    Chronic combined systolic and diastolic CHF (congestive heart failure) (HCC)    Complication of anesthesia    "had hard time waking up a long time ago" (02/05/2018)   Cyst of kidney, acquired    "recently found; ? side" (02/05/2018)   GERD (gastroesophageal reflux disease)    History of hiatal hernia    Hypertension    Mild CAD    a. 30-40% LAD in 2019.   Neuropathy    NICM (nonischemic cardiomyopathy) (HCC)    Persistent atrial fibrillation    Pleural effusion    dx 04/01/12   Skin cancer    "face, nose" (02/05/2018)   Syncope 02/03/2018    Past Surgical History:  Procedure Laterality Date   APPENDECTOMY     BACK SURGERY     CARDIAC CATHETERIZATION  02/05/2018   CATARACT EXTRACTION, BILATERAL Bilateral    CHOLECYSTECTOMY     HIP ARTHROPLASTY Left 01/19/2019   Procedure: ARTHROPLASTY  BIPOLAR HIP (HEMIARTHROPLASTY);  Surgeon: Frank Holbrook, MD;  Location: [facility];  Service: Orthopedics;  Laterality: Left;   JOINT REPLACEMENT     LEFT HEART CATH AND CORONARY ANGIOGRAPHY N/A 02/05/2018   Procedure: LEFT HEART CATH AND CORONARY ANGIOGRAPHY;  Surgeon: Frank Crome, MD;  Location: [facility];  Service: Cardiovascular;  Laterality: N/A;   LUMBAR DISC SURGERY  1980s   PACEMAKER IMPLANT N/A 02/06/2018   Procedure: PACEMAKER IMPLANT;  Surgeon: Frank Haw, MD;  Location: [facility];  Service: Cardiovascular;  Laterality: N/A;   SKIN CANCER EXCISION     "face, nose" (02/05/2018)   TONSILLECTOMY     TOTAL HIP ARTHROPLASTY Right 1990s/2000s   ULTRASOUND GUIDANCE FOR VASCULAR ACCESS  02/05/2018   Procedure: Ultrasound Guidance For Vascular Access;  Surgeon: Frank Crome, MD;  Location: [facility];  Service: Cardiovascular;;       History of present illness and  Hospital Course:     Kindly see H&P for history of present illness and admission details, please review complete Labs, Consult reports and Test reports for  all details in brief  HPI  from the history and physical done on the day of admission 01/17/2019  HPI: Frank Elliott is a 85 y.o. male with medical history significant of hypertension, combined CHF 15 to 50% with grade 2 diastolic dysfunction, CAD, persistent atrial fibrillation on Eliquis, heart block s/p pacemaker, and BPH; who presents with complaints of left hip pain.  Patient had been living in independent living up until 8 days ago when he fell getting off his stationary bike.  His right leg reportedly got caught on the seat and he landed on his left side.  He had been unable to get up on his own and called his son who came over and helped him up.  Later on that evening patient had hold of his walker and one hand and a plate in other he fell again.  He said it felt like his left leg was not there at the time.  The following day saw his PCP who did x-rays, but no acute fractures were seen.  Since that time he has been staying with his son and progressively getting weaker.  He has been complaining of pain in the left hip and leg.  Unable to bear weight.  Associated symptoms include progressively worsening bilateral lower extremity edema, swelling in his hands, weight 162 on last check (baseline 155), and wheezing.  Son notes that he is completely wheelchair-bound at this time.  He had been seen by his cardiologist and after repeat x-rays were negative was sent for CT scan of the hip 2 days ago.  However due to patient's worsening condition they came to the hospital before they were able to get the results from the CT scan.  ED Course: On admission into the emergency department patient was noted to be afebrile, pulse 50-65, respirations 1122, blood pressures maintained, and O2 saturations 95-99% on room air.  Labs revealed elevated MCV and MCH, BUN 48, and creatinine 1.64, total bilirubin 1.6, and BNP 385.7.  CT scan of the left hip revealed a acute impacted subcapital fracture of the left femoral neck.   Frank Elliott of orthopedic surgery was consulted.  [facility] called to admit   Hospital Course   85 year old with a history of essential hypertension, combined diastolic/systolic CHF ef [id number] grade 2 diastolic dysfunction, coronary artery disease, persistent atrial fibrillation on Eliquis, heart block status post  pacemaker, BPH came with left hip pain.  Had a fall about a week prior to admission from a stationary bike.  Outpatient x-rays with PCP were negative but due to persistence of pain he came to the hospital.  CT showed left acute impacted subcapital femoral neck fracture.  Patient status post surgical repair by Dr. Marcelino Scot 01/17/2019,  he is on anticoagulation for A. fib, initially used to be on heparin drip, transitioned to Eliquis 01/19/2019, postoperative hospital course was complicated by acute blood loss anemia, where he required 2 units PRBC transfusion on 01/21/2019, as well worsening renal failure, with most recent creatinine of 2.7, felt secondary to hypotension, 9/11 a.m., patient with worsening mental status, hypotensive, transferred to ICU for pressors, work-up significant for septic shock due to bacteremia/UTI, required pressors for 36 hours, currently improvement will be transferred out of ICU .  Patient with some volume overload where he is back on diuresis, patient had urinary retention at day of discharge where he required Foley catheter insertion as well.   Acute impacted subcapital fracture of the left femoral neck, -Outpatient x-rays negative.  Confirmed fracture on the CT -Pain control, incentive spirometer, bowel regimen  - Patient underwent operative management on 9/8 by Dr Marcelino Scot. -Seen by physical therapy with recommendations for SNF   Septic shock due to UTI/Klebsiella bacteremia - on 9/11, patient hypotensive, altered mental status (unresponsive) with elevated lactic acid, leukocytosis, and significantly elevated procalcitonin at 57. -Secondary to Klebsiella on dopamine drip  initially, currently being tapered off. -recieved IV hydrocortisone -related to the Klebsiella bacteremia. -Treated with IV Rocephin initially, currently transitioned to Ancef ,will need total of 2 weeks of antibiotics treatment, he finished 1 week of IV antibiotic during hospital stay, to finish another week of Keflex as an outpatient for total of 2 weeks treatment.   -Surveillance/repeat blood cultures with no growth at time of discharge . -Leukocytosis continue to trend down, procalcitonin trending down as well, which is reassuring  Klebsiella bacteremia -This is most likely due to UTI , unfortunately urine cultures were not sent, to finish total of 2 weeks of antibiotics.  - discussed with orthopedic to check postoperative wounds, it has been checked, no evidence of discharge  Acute metabolic encephalopathy -CT head with no acute finding, this is most likely in the setting of sepsis, resolved, mentation back to baseline  Urinary retention -Day of discharge, patient had some suprapubic fullness/tenderness, Foley catheter was inserted, with immediate 800 cc of urine output after Foley insertion, he will be started on Flomax, will need voiding trial to DC his Foley catheter when he is more ambulatory.   Chronic combined congestive heart failure with reduced ejection fraction, EF [id number], grade 2 diastolic dysfunction, class III -  Cardiology consulted.  Strict input and output.  Daily weight -Required significant amount of IV fluids during ICU stay, volume overloaded, started back on diuresis, to continue with Lasix at home, will be resumed on low-dose beta-blockers, no ACE/ARB in the setting of worsening renal function .  Acute kidney injury on CKD stage III - Baseline creatinine 1.3, elevated at admission to 1.6, did peak at 2.7,most likely in the setting of ATN due to sepsis and hypotension . -Currently improved, creatinine 1.5 on discharge  Acute blood loss anemia -Baseline hemoglobin  around 13, did drop to 8, he received 2 units PRBC 9/10 .  Hemoglobin remained stable  Persistent atrial fibrillation on chronic anticoagulation -Eliquis was on hold for surgery, heparin drip initially, currently back on Eliquis,  dose was adjusted to renal function, will be on 2.5 mg p.o. twice daily. -Continue with amiodarone for heart rate control, as well on Coreg  Essential hypertension -Cozaar has been discontinued in setting of AK I and low blood pressure, he is on lower dose Coreg for systolic CHF .  Coronary artery disease -Currently chest pain-free.  Continue home meds  History of heart block status post [employer] Jude dual-chamber pacemaker  GERD -PPI    Discharge Condition:  stable   Follow UP   Contact information for follow-up providers    Frank Bridgeton, MD Follow up in 1 week(s).   Specialty: Orthopedic Surgery Contact information: [place] [address] [phone]            Contact information for after-discharge care    Destination    [name]erred [facility] .   Service: Skilled Nursing Contact information: [address] [place] [phone]                    Discharge Instructions  and  Discharge Medications    Discharge Instructions    Discharge instructions   Complete by: As directed    Follow with Primary MD Frank Melter, MD   Get CBC, CMP,checked  by Primary MD next visit.    Disposition snf   Diet: Heart Healthy  , with feeding assistance and aspiration precautions.  For Heart failure patients - Check your Weight same time everyday, if you gain over 2 pounds, or you develop in leg swelling, experience more shortness of breath or chest pain, call your Primary MD immediately. Follow Cardiac Low Salt Diet and 1.5 lit/day fluid restriction.   On your next visit with your primary care physician please Get Medicines reviewed and adjusted.   Please request your  Prim.MD to go over all Hospital Tests and Procedure/Radiological results at the follow up, please get all Hospital records sent to your Prim MD by signing hospital release before you go home.   If you experience worsening of your admission symptoms, develop shortness of breath, life threatening emergency, suicidal or homicidal thoughts you must seek medical attention immediately by calling 911 or calling your MD immediately  if symptoms less severe.  You Must read complete instructions/literature along with all the possible adverse reactions/side effects for all the Medicines you take and that have been prescribed to you. Take any new Medicines after you have completely understood and accpet all the possible adverse reactions/side effects.   Do not drive, operating heavy machinery, perform activities at heights, swimming or participation in water activities or provide baby sitting services if your were admitted for syncope or siezures until you have seen by Primary MD or a Neurologist and advised to do so again.  Do not drive when taking Pain medications.    Do not take more than prescribed Pain, Sleep and Anxiety Medications  Special Instructions: If you have smoked or chewed Tobacco  in the last 2 yrs please stop smoking, stop any regular Alcohol  and or any Recreational drug use.  Wear Seat belts while driving.   Please note  You were cared for by a hospitalist during your hospital stay. If you have any questions about your discharge medications or the care you received while you were in the hospital after you are discharged, you can call the unit and asked to speak with the hospitalist on call if the hospitalist that took care of you is not available.  Once you are discharged, your primary care physician will handle any further medical issues. Please note that NO REFILLS for any discharge medications will be authorized once you are discharged, as it is imperative that you return to your  primary care physician (or establish a relationship with a primary care physician if you do not have one) for your aftercare needs so that they can reassess your need for medications and monitor your lab values.   Increase activity slowly   Complete by: As directed      Allergies as of 01/28/2019      Reactions   Aspirin Anaphylaxis, Hives, Swelling   Whiskey [alcohol] Anaphylaxis, Hives   Amoxicillin Other (See Comments)   Causes sore throat   Avelox [moxifloxacin Hcl In Nacl] Diarrhea, Other (See Comments)   Constipation, also   Lisinopril Cough   Sulfa Antibiotics Hives   Tape Itching   Also causes redness--Paper tape okay   Zithromax [azithromycin] Other (See Comments)   Sore throat   Penicillins Rash   Did it involve swelling of the face/tongue/throat, SOB, or low BP? Yes Did it involve sudden or severe rash/hives, skin peeling, or any reaction on the inside of your mouth or nose? Unk Did you need to seek medical attention at a hospital or doctor's office? Unk When did it last happen?Years ago If all above answers are NO, may proceed with cephalosporin use.      Medication List    STOP taking these medications   gabapentin 300 MG capsule Commonly known as: NEURONTIN   losartan 100 MG tablet Commonly known as: COZAAR   traMADol 50 MG tablet Commonly known as: ULTRAM     TAKE these medications   ALIGN PO Take 1 capsule by mouth daily.   amiodarone 200 MG tablet Commonly known as: PACERONE Take 1 tablet (200 mg total) twice a day for one month and then reduce to one tablet daily What changed:   how much to take  how to take this  when to take this  additional instructions   apixaban 2.5 MG Tabs tablet Commonly known as: ELIQUIS Take 1 tablet (2.5 mg total) by mouth 2 (two) times daily. What changed:   medication strength  how much to take   betamethasone dipropionate 0.05 % cream Commonly known as: DIPROLENE Apply 1 application topically 2  (two) times daily as needed (as directed).   calcitonin (salmon) 200 UNIT/ACT nasal spray Commonly known as: MIACALCIN/FORTICAL Place 1 spray into alternate nostrils every evening. 7 PM   carvedilol 3.125 MG tablet Commonly known as: Coreg Take 1 tablet (3.125 mg total) by mouth 2 (two) times daily. What changed:   medication strength  how much to take   cephALEXin 500 MG capsule Commonly known as: KEFLEX Take 1 capsule (500 mg total) by mouth 3 (three) times daily for 7 days.   feeding supplement (ENSURE ENLIVE) Liqd Take 237 mLs by mouth daily at 3 pm.   fluticasone 50 MCG/ACT nasal spray Commonly known as: FLONASE Place 1 spray into both nostrils daily as needed for allergies.   furosemide 20 MG tablet Commonly known as: LASIX Take as directed 1 tablet tues, wed, fri-Sunday 2 tablets on mon & thursdays May have 1 extra tablet daily as needed for swelling What changed:   how much to take  how to take this  when to take this  additional instructions   HYDROcodone-acetaminophen 5-325 MG tablet Commonly known as: NORCO/VICODIN Take 2 tablets by mouth every 6 (  six) hours as needed for moderate pain.   ketoconazole 2 % cream Commonly known as: NIZORAL Apply 1 application topically See admin instructions. Apply as directed daily   omeprazole 20 MG capsule Commonly known as: PRILOSEC Take 20 mg by mouth daily before breakfast.   senna-docusate 8.6-50 MG tablet Commonly known as: Senokot-S Take 3 tablets by mouth 2 (two) times daily.   tamsulosin 0.4 MG Caps capsule Commonly known as: Flomax Take 1 capsule (0.4 mg total) by mouth daily after supper.   VITAMIN B-12 PO Take 1 tablet by mouth daily.   Vitamin D3 25 MCG tablet Commonly known as: Vitamin D Take 2 tablets (2,000 Units total) by mouth 2 (two) times daily.         Diet and Activity recommendation: See Discharge Instructions above   Consults obtained -    Orthopedic  Cardiology  PCCM   Major procedures and Radiology Reports - PLEASE review detailed and final reports for all details, in brief -   PROCEDURE:  Procedure(s): ARTHROPLASTY UNIPOLAR HIP (HEMIARTHROPLASTY) (Left) with #7 femoral stem, 0 neck, and 50 mm head using [facility] [employer] by Dr. Janalyn Rouse on 01/19/2019   Dg Knee 1-2 Views Left  Result Date: 01/17/2019 CLINICAL DATA:  Left femoral neck fracture. EXAM: LEFT KNEE - 1-2 VIEW COMPARISON:  None FINDINGS: Diminished exam detail due to limited views and nonanatomic orientation of lateral radiograph. Mild diffuse soft tissue swelling noted without joint effusion. Sharpening the tibial spines and medial compartment narrowing identified. No fractures identified. IMPRESSION: 1. Limited exam.  No fracture identified. 2. Degenerative joint disease. 3. Soft tissue swelling. Electronically Signed   By: Kerby Moors M.D.   On: 01/17/2019 14:40   Ct Head Wo Contrast  Result Date: 01/22/2019 CLINICAL DATA:  Altered mental status, unresponsive EXAM: CT HEAD WITHOUT CONTRAST TECHNIQUE: Contiguous axial images were obtained from the base of the skull through the vertex without intravenous contrast. COMPARISON:  01/17/2019 FINDINGS: Brain: No evidence of acute infarction, hemorrhage, hydrocephalus, extra-axial collection or mass lesion/mass effect. There is extensive periventricular and deep white matter hypodensity with nonacute lacunar infarction of the anterior limb of the left internal capsule and the posterior right corona radiata. Vascular: No hyperdense vessel or unexpected calcification. Skull: Normal. Negative for fracture or focal lesion. Sinuses/Orbits: No acute finding. Other: None. IMPRESSION: No acute intracranial pathology. Advanced small-vessel white matter disease and nonacute lacunar infarctions. Electronically Signed   By: Eddie Candle M.D.   On: 01/22/2019 09:41   Ct Head Wo Contrast  Result Date: 01/17/2019 CLINICAL DATA:   Recurrent falls.  Increasing weakness. EXAM: CT HEAD WITHOUT CONTRAST TECHNIQUE: Contiguous axial images were obtained from the base of the skull through the vertex without intravenous contrast. COMPARISON:  Head CT scan 02/03/2018. FINDINGS: Brain: No evidence of acute infarction, hemorrhage, hydrocephalus, extra-axial collection or mass lesion/mass effect. Remote left basal ganglia lacunar infarction is new since the prior CT. Chronic microvascular ischemic change and cortical atrophy are stable in appearance. Vascular: No hyperdense vessel or unexpected calcification. Skull: Intact.  No focal lesion. Sinuses/Orbits: Negative. Other: None. IMPRESSION: No acute abnormality. No change in atrophy and chronic microvascular ischemic change. Electronically Signed   By: Inge Rise M.D.   On: 01/17/2019 12:08   Ct Lumbar Spine Wo Contrast  Result Date: 01/17/2019 CLINICAL DATA:  Recurrent falls, most recently 01/09/2019. Low back pain. EXAM: CT LUMBAR SPINE WITHOUT CONTRAST TECHNIQUE: Multidetector CT imaging of the lumbar spine was performed without intravenous contrast administration. Multiplanar  CT image reconstructions were also generated. COMPARISON:  MRI lumbar spine 05/11/2008. Plain films lumbar spine 01/11/2019. FINDINGS: Segmentation: Standard. Alignment: Maintained with straightening of lordosis noted. Vertebrae: Remote L4 superior endplate compression fracture is unchanged. No acute fracture. Right hip replacement noted. Paraspinal and other soft tissues: Atherosclerosis and sigmoid diverticulosis are seen. Disc levels: T12-L1: Facet degenerative disease. Vacuum disc phenomenon and a minimal bulge. No stenosis. L1-2: Partial autologous fusion across the disc interspace. Disc bulge, facet arthropathy and ligamentum flavum thickening cause mild central canal narrowing. Foramina appear open. L2-3: Facet arthropathy, ligamentum flavum thickening and a shallow disc bulge. Mild central canal narrowing.  Foramina open. L3-4: Status post posterior decompression. There is a shallow disc bulge and endplate spur. Facet arthropathy. There is mild central canal stenosis. Right worse than left subarticular recess narrowing is also present. Moderate bilateral foraminal narrowing is worse on the right. L4-5: Status post decompression. Advanced facet arthropathy. The right facets are ankylosed. The central canal is open. Moderately severe to severe bilateral foraminal narrowing. L5-S1: Status post decompression. The central canal is open. Facet arthropathy is present and contributes to moderate to moderately severe bilateral foraminal narrowing. IMPRESSION: No acute abnormality. Remote L4 compression fracture is unchanged since 2009. No marked change in lumbar spondylosis most notable at L4-5 where there is marked bilateral foraminal narrowing due to disc and facet arthropathy. Please see above for descriptions of individual levels. Electronically Signed   By: Inge Rise M.D.   On: 01/17/2019 12:17   Ct Hip Left Wo Contrast  Result Date: 01/17/2019 CLINICAL DATA:  Left hip pain EXAM: CT OF THE LEFT HIP WITHOUT CONTRAST TECHNIQUE: Multidetector CT imaging of the left hip was performed according to the standard protocol. Multiplanar CT image reconstructions were also generated. COMPARISON:  X-ray 12/31/2018 FINDINGS: Bones/Joint/Cartilage Acute, impacted subcapital fracture of the left femoral neck with approximately 2 cm of foreshortening. No intertrochanteric or intra-articular fracture component identified. Femoral head alignment is maintained without dislocation. Moderate left hip osteoarthritis. No additional fracture identified. Ligaments Suboptimally assessed by CT. Muscles and Tendons No intramuscular hematoma.  Tendons grossly intact. Soft tissues Mild soft tissue induration over the lateral aspect of the left hip, likely posttraumatic. Other Extensive sigmoid diverticulosis. IMPRESSION: Acute, impacted,  subcapital fracture of the left femoral neck. Electronically Signed   By: Davina Poke M.D.   On: 01/17/2019 12:21   Dg Chest Port 1 View  Result Date: 01/22/2019 CLINICAL DATA:  Fever EXAM: PORTABLE CHEST 1 VIEW COMPARISON:  01/17/2019 FINDINGS: Left pacer remains in place, unchanged. Heart is normal size. No confluent airspace opacities or effusions. No acute bony abnormality. IMPRESSION: No acute cardiopulmonary disease. Electronically Signed   By: Rolm Baptise M.D.   On: 01/22/2019 03:12   Dg Chest Portable 1 View  Result Date: 01/17/2019 CLINICAL DATA:  Pt has been falling AND needing help faster than he can get medical care (per pt's son), with each past fall occurring, his capabilities has decreased, son is concerned about his fathers lack of usual abilities to get up walk. EXAM: PORTABLE CHEST 1 VIEW COMPARISON:  03/29/2018 FINDINGS: Patient has a LEFT-sided transvenous pacemaker with leads to the RIGHT atrium and RIGHT ventricle. The heart is enlarged and stable in configuration. No pulmonary edema. No consolidations or pleural effusions. Chronic changes in both shoulders. IMPRESSION: Stable cardiomegaly. No evidence for acute abnormality. Electronically Signed   By: Nolon Nations M.D.   On: 01/17/2019 11:09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

## 2019-01-28 NOTE — TOC Transition Note (Signed)
Transition of Care [facility]) - CM/SW Discharge Note   Patient Details  Name: Frank Elliott MRN: [id number] Date of Birth: 03/23/1926  Transition of Care [facility]) CM/SW Contact:  Benard Halsted, [place] Phone Number: 01/28/2019, 9:09 AM   Clinical Narrative:    Patient will DC to: [name] Anticipated DC date: 01/28/19 Family notified: Luvenia Redden at bedside Transport by: Corey Harold   Per MD patient ready for DC to [name]. RN, patient, patient's family, and facility notified of DC. Discharge Summary and FL2 sent to facility. RN to call report prior to discharge ([phone]). DC packet on chart. Ambulance transport requested for patient.   CSW will sign off for now as social work intervention is no longer needed. Please consult [place] again if new needs arise.  Frank Fishman, Frank Elliott Clinical Social Worker [phone]    Final next level of care: Skilled Nursing Facility Barriers to Discharge: No Barriers Identified   Patient Goals and CMS Choice Patient states their goals for this hospitalization and ongoing recovery are:: Pt son is agreeable to SNF. CMS Medicare.gov Compare Post Acute Care list provided to:: Patient Represenative (must comment) Choice offered to / list presented to : [name] POA / Guardian, Adult Children  Discharge Placement   Existing PASRR number confirmed : 01/28/19          Patient chooses bed at: [name] Patient to be transferred to facility by: [name] Name of family member notified: [name], son Patient and family notified of of transfer: 01/28/19  Discharge Plan and Services In-house Referral: Clinical Social Work Discharge Planning Services: NA Post Acute Care Choice: [name]          DME Arranged: N/A DME Agency: NA       HH Arranged: NA HH Agency: NA        Social Determinants of Health (SDOH) Interventions     Readmission Risk Interventions No flowsheet data found.

## 2019-01-29 DIAGNOSIS — K219 Gastro-esophageal reflux disease without esophagitis: Secondary | ICD-10-CM | POA: Diagnosis not present

## 2019-01-29 DIAGNOSIS — K5900 Constipation, unspecified: Secondary | ICD-10-CM | POA: Diagnosis not present

## 2019-01-29 DIAGNOSIS — R339 Retention of urine, unspecified: Secondary | ICD-10-CM | POA: Diagnosis not present

## 2019-01-29 DIAGNOSIS — I4891 Unspecified atrial fibrillation: Secondary | ICD-10-CM | POA: Diagnosis not present

## 2019-01-29 DIAGNOSIS — I2510 Atherosclerotic heart disease of native coronary artery without angina pectoris: Secondary | ICD-10-CM | POA: Diagnosis not present

## 2019-01-29 DIAGNOSIS — D500 Iron deficiency anemia secondary to blood loss (chronic): Secondary | ICD-10-CM | POA: Diagnosis not present

## 2019-01-29 DIAGNOSIS — I5042 Chronic combined systolic (congestive) and diastolic (congestive) heart failure: Secondary | ICD-10-CM | POA: Diagnosis not present

## 2019-01-29 DIAGNOSIS — N390 Urinary tract infection, site not specified: Secondary | ICD-10-CM | POA: Diagnosis not present

## 2019-01-29 DIAGNOSIS — N183 Chronic kidney disease, stage 3 (moderate): Secondary | ICD-10-CM | POA: Diagnosis not present

## 2019-01-29 DIAGNOSIS — M6281 Muscle weakness (generalized): Secondary | ICD-10-CM | POA: Diagnosis not present

## 2019-01-29 DIAGNOSIS — S7292XA Unspecified fracture of left femur, initial encounter for closed fracture: Secondary | ICD-10-CM | POA: Diagnosis not present

## 2019-01-29 DIAGNOSIS — R7881 Bacteremia: Secondary | ICD-10-CM | POA: Diagnosis not present

## 2019-01-29 LAB — CULTURE, BLOOD (ROUTINE X 2)
Culture: NO GROWTH
Culture: NO GROWTH
Special Requests: ADEQUATE
Special Requests: ADEQUATE

## 2019-02-01 DIAGNOSIS — S7292XA Unspecified fracture of left femur, initial encounter for closed fracture: Secondary | ICD-10-CM | POA: Diagnosis not present

## 2019-02-01 DIAGNOSIS — N390 Urinary tract infection, site not specified: Secondary | ICD-10-CM | POA: Diagnosis not present

## 2019-02-01 DIAGNOSIS — D500 Iron deficiency anemia secondary to blood loss (chronic): Secondary | ICD-10-CM | POA: Diagnosis not present

## 2019-02-01 DIAGNOSIS — I2510 Atherosclerotic heart disease of native coronary artery without angina pectoris: Secondary | ICD-10-CM | POA: Diagnosis not present

## 2019-02-01 DIAGNOSIS — N183 Chronic kidney disease, stage 3 (moderate): Secondary | ICD-10-CM | POA: Diagnosis not present

## 2019-02-01 DIAGNOSIS — K5900 Constipation, unspecified: Secondary | ICD-10-CM | POA: Diagnosis not present

## 2019-02-01 DIAGNOSIS — I4891 Unspecified atrial fibrillation: Secondary | ICD-10-CM | POA: Diagnosis not present

## 2019-02-01 DIAGNOSIS — K219 Gastro-esophageal reflux disease without esophagitis: Secondary | ICD-10-CM | POA: Diagnosis not present

## 2019-02-01 DIAGNOSIS — I5042 Chronic combined systolic (congestive) and diastolic (congestive) heart failure: Secondary | ICD-10-CM | POA: Diagnosis not present

## 2019-02-01 DIAGNOSIS — R7881 Bacteremia: Secondary | ICD-10-CM | POA: Diagnosis not present

## 2019-02-01 DIAGNOSIS — M6281 Muscle weakness (generalized): Secondary | ICD-10-CM | POA: Diagnosis not present

## 2019-02-01 DIAGNOSIS — R339 Retention of urine, unspecified: Secondary | ICD-10-CM | POA: Diagnosis not present

## 2019-02-02 DIAGNOSIS — R7881 Bacteremia: Secondary | ICD-10-CM | POA: Diagnosis not present

## 2019-02-02 DIAGNOSIS — M6281 Muscle weakness (generalized): Secondary | ICD-10-CM | POA: Diagnosis not present

## 2019-02-02 DIAGNOSIS — G8911 Acute pain due to trauma: Secondary | ICD-10-CM | POA: Diagnosis not present

## 2019-02-02 DIAGNOSIS — M80052D Age-related osteoporosis with current pathological fracture, left femur, subsequent encounter for fracture with routine healing: Secondary | ICD-10-CM | POA: Diagnosis not present

## 2019-02-02 DIAGNOSIS — R338 Other retention of urine: Secondary | ICD-10-CM | POA: Diagnosis not present

## 2019-02-03 DIAGNOSIS — I5042 Chronic combined systolic (congestive) and diastolic (congestive) heart failure: Secondary | ICD-10-CM | POA: Diagnosis not present

## 2019-02-03 DIAGNOSIS — R609 Edema, unspecified: Secondary | ICD-10-CM | POA: Diagnosis not present

## 2019-02-05 DIAGNOSIS — K5900 Constipation, unspecified: Secondary | ICD-10-CM | POA: Diagnosis not present

## 2019-02-05 DIAGNOSIS — S7292XA Unspecified fracture of left femur, initial encounter for closed fracture: Secondary | ICD-10-CM | POA: Diagnosis not present

## 2019-02-05 DIAGNOSIS — G8911 Acute pain due to trauma: Secondary | ICD-10-CM | POA: Diagnosis not present

## 2019-02-05 DIAGNOSIS — R7881 Bacteremia: Secondary | ICD-10-CM | POA: Diagnosis not present

## 2019-02-05 DIAGNOSIS — I4891 Unspecified atrial fibrillation: Secondary | ICD-10-CM | POA: Diagnosis not present

## 2019-02-05 DIAGNOSIS — M6281 Muscle weakness (generalized): Secondary | ICD-10-CM | POA: Diagnosis not present

## 2019-02-05 DIAGNOSIS — I2510 Atherosclerotic heart disease of native coronary artery without angina pectoris: Secondary | ICD-10-CM | POA: Diagnosis not present

## 2019-02-05 DIAGNOSIS — N183 Chronic kidney disease, stage 3 (moderate): Secondary | ICD-10-CM | POA: Diagnosis not present

## 2019-02-05 DIAGNOSIS — R338 Other retention of urine: Secondary | ICD-10-CM | POA: Diagnosis not present

## 2019-02-05 DIAGNOSIS — N390 Urinary tract infection, site not specified: Secondary | ICD-10-CM | POA: Diagnosis not present

## 2019-02-05 DIAGNOSIS — M80052D Age-related osteoporosis with current pathological fracture, left femur, subsequent encounter for fracture with routine healing: Secondary | ICD-10-CM | POA: Diagnosis not present

## 2019-02-05 DIAGNOSIS — R339 Retention of urine, unspecified: Secondary | ICD-10-CM | POA: Diagnosis not present

## 2019-02-08 DIAGNOSIS — N183 Chronic kidney disease, stage 3 (moderate): Secondary | ICD-10-CM | POA: Diagnosis not present

## 2019-02-08 DIAGNOSIS — I2510 Atherosclerotic heart disease of native coronary artery without angina pectoris: Secondary | ICD-10-CM | POA: Diagnosis not present

## 2019-02-08 DIAGNOSIS — R7881 Bacteremia: Secondary | ICD-10-CM | POA: Diagnosis not present

## 2019-02-08 DIAGNOSIS — N390 Urinary tract infection, site not specified: Secondary | ICD-10-CM | POA: Diagnosis not present

## 2019-02-08 DIAGNOSIS — S72002D Fracture of unspecified part of neck of left femur, subsequent encounter for closed fracture with routine healing: Secondary | ICD-10-CM | POA: Diagnosis not present

## 2019-02-08 DIAGNOSIS — I4891 Unspecified atrial fibrillation: Secondary | ICD-10-CM | POA: Diagnosis not present

## 2019-02-08 DIAGNOSIS — S7292XA Unspecified fracture of left femur, initial encounter for closed fracture: Secondary | ICD-10-CM | POA: Diagnosis not present

## 2019-02-08 DIAGNOSIS — M80052D Age-related osteoporosis with current pathological fracture, left femur, subsequent encounter for fracture with routine healing: Secondary | ICD-10-CM | POA: Diagnosis not present

## 2019-02-08 DIAGNOSIS — M6281 Muscle weakness (generalized): Secondary | ICD-10-CM | POA: Diagnosis not present

## 2019-02-08 DIAGNOSIS — G8911 Acute pain due to trauma: Secondary | ICD-10-CM | POA: Diagnosis not present

## 2019-02-08 DIAGNOSIS — R339 Retention of urine, unspecified: Secondary | ICD-10-CM | POA: Diagnosis not present

## 2019-02-08 DIAGNOSIS — R338 Other retention of urine: Secondary | ICD-10-CM | POA: Diagnosis not present

## 2019-02-08 DIAGNOSIS — K5900 Constipation, unspecified: Secondary | ICD-10-CM | POA: Diagnosis not present
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
## 2019-02-12 DIAGNOSIS — R319 Hematuria, unspecified: Secondary | ICD-10-CM | POA: Diagnosis not present

## 2019-02-12 DIAGNOSIS — R21 Rash and other nonspecific skin eruption: Secondary | ICD-10-CM | POA: Diagnosis not present

## 2019-02-17 DIAGNOSIS — R339 Retention of urine, unspecified: Secondary | ICD-10-CM | POA: Diagnosis not present

## 2019-02-17 DIAGNOSIS — M6281 Muscle weakness (generalized): Secondary | ICD-10-CM | POA: Diagnosis not present

## 2019-02-17 DIAGNOSIS — G8911 Acute pain due to trauma: Secondary | ICD-10-CM | POA: Diagnosis not present

## 2019-02-17 DIAGNOSIS — R21 Rash and other nonspecific skin eruption: Secondary | ICD-10-CM | POA: Diagnosis not present

## 2019-02-17 DIAGNOSIS — R319 Hematuria, unspecified: Secondary | ICD-10-CM | POA: Diagnosis not present

## 2019-02-17 DIAGNOSIS — I2510 Atherosclerotic heart disease of native coronary artery without angina pectoris: Secondary | ICD-10-CM | POA: Diagnosis not present

## 2019-02-17 DIAGNOSIS — M80052D Age-related osteoporosis with current pathological fracture, left femur, subsequent encounter for fracture with routine healing: Secondary | ICD-10-CM | POA: Diagnosis not present

## 2019-02-17 DIAGNOSIS — R7881 Bacteremia: Secondary | ICD-10-CM | POA: Diagnosis not present

## 2019-02-17 DIAGNOSIS — S7292XA Unspecified fracture of left femur, initial encounter for closed fracture: Secondary | ICD-10-CM | POA: Diagnosis not present

## 2019-02-17 DIAGNOSIS — I4891 Unspecified atrial fibrillation: Secondary | ICD-10-CM | POA: Diagnosis not present

## 2019-02-17 DIAGNOSIS — N390 Urinary tract infection, site not specified: Secondary | ICD-10-CM | POA: Diagnosis not present

## 2019-02-17 DIAGNOSIS — R338 Other retention of urine: Secondary | ICD-10-CM | POA: Diagnosis not present

## 2019-02-22 DIAGNOSIS — I4891 Unspecified atrial fibrillation: Secondary | ICD-10-CM | POA: Diagnosis not present

## 2019-02-22 DIAGNOSIS — M80052D Age-related osteoporosis with current pathological fracture, left femur, subsequent encounter for fracture with routine healing: Secondary | ICD-10-CM | POA: Diagnosis not present

## 2019-02-22 DIAGNOSIS — R7881 Bacteremia: Secondary | ICD-10-CM | POA: Diagnosis not present

## 2019-02-22 DIAGNOSIS — R319 Hematuria, unspecified: Secondary | ICD-10-CM | POA: Diagnosis not present

## 2019-02-22 DIAGNOSIS — R338 Other retention of urine: Secondary | ICD-10-CM | POA: Diagnosis not present

## 2019-02-22 DIAGNOSIS — R339 Retention of urine, unspecified: Secondary | ICD-10-CM | POA: Diagnosis not present

## 2019-02-22 DIAGNOSIS — M6281 Muscle weakness (generalized): Secondary | ICD-10-CM | POA: Diagnosis not present

## 2019-02-22 DIAGNOSIS — S7292XA Unspecified fracture of left femur, initial encounter for closed fracture: Secondary | ICD-10-CM | POA: Diagnosis not present

## 2019-02-22 DIAGNOSIS — I2510 Atherosclerotic heart disease of native coronary artery without angina pectoris: Secondary | ICD-10-CM | POA: Diagnosis not present

## 2019-02-22 DIAGNOSIS — R21 Rash and other nonspecific skin eruption: Secondary | ICD-10-CM | POA: Diagnosis not present

## 2019-02-22 DIAGNOSIS — N390 Urinary tract infection, site not specified: Secondary | ICD-10-CM | POA: Diagnosis not present

## 2019-02-22 DIAGNOSIS — G8911 Acute pain due to trauma: Secondary | ICD-10-CM | POA: Diagnosis not present

## 2019-02-22 LAB — CUP PACEART REMOTE DEVICE CHECK
Battery Remaining Longevity: 125 mo
Battery Remaining Percentage: 95.5 %
Battery Voltage: 2.99 V
Brady Statistic AP VP Percent: 0 %
Brady Statistic AP VS Percent: 0 %
Brady Statistic AS VP Percent: 0 %
Brady Statistic AS VS Percent: 0 %
Brady Statistic RA Percent Paced: 1 %
Brady Statistic RV Percent Paced: 90 %
Date Time Interrogation Session: 20201012060015
Implantable Lead Implant Date: 20190927
Implantable Lead Implant Date: 20190927
Implantable Lead Location: 753859
Implantable Lead Location: 753860
Implantable Pulse Generator Implant Date: 20190927
Lead Channel Impedance Value: 460 Ohm
Lead Channel Impedance Value: 530 Ohm
Lead Channel Pacing Threshold Amplitude: 0.5 V
Lead Channel Pacing Threshold Amplitude: 0.625 V
Lead Channel Pacing Threshold Pulse Width: 0.5 ms
Lead Channel Pacing Threshold Pulse Width: 0.5 ms
Lead Channel Sensing Intrinsic Amplitude: 1.5 mV
Lead Channel Sensing Intrinsic Amplitude: 9.5 mV
Lead Channel Setting Pacing Amplitude: 0.875
Lead Channel Setting Pacing Amplitude: 2 V
Lead Channel Setting Pacing Pulse Width: 0.5 ms
Lead Channel Setting Sensing Sensitivity: 2 mV
Pulse Gen Model: 2272
Pulse Gen Serial Number: 9066149

## 2019-02-23 ENCOUNTER — Ambulatory Visit (INDEPENDENT_AMBULATORY_CARE_PROVIDER_SITE_OTHER): Payer: Medicare Other | Admitting: *Deleted

## 2019-02-23 ENCOUNTER — Telehealth: Payer: Self-pay | Admitting: Interventional Cardiology

## 2019-02-23 DIAGNOSIS — I5042 Chronic combined systolic (congestive) and diastolic (congestive) heart failure: Secondary | ICD-10-CM | POA: Diagnosis not present

## 2019-02-23 DIAGNOSIS — N183 Chronic kidney disease, stage 3 unspecified: Secondary | ICD-10-CM | POA: Diagnosis not present

## 2019-02-23 DIAGNOSIS — W19XXXD Unspecified fall, subsequent encounter: Secondary | ICD-10-CM | POA: Diagnosis not present

## 2019-02-23 DIAGNOSIS — G629 Polyneuropathy, unspecified: Secondary | ICD-10-CM | POA: Diagnosis not present

## 2019-02-23 DIAGNOSIS — N400 Enlarged prostate without lower urinary tract symptoms: Secondary | ICD-10-CM | POA: Diagnosis not present

## 2019-02-23 DIAGNOSIS — I5041 Acute combined systolic (congestive) and diastolic (congestive) heart failure: Secondary | ICD-10-CM

## 2019-02-23 DIAGNOSIS — Z950 Presence of cardiac pacemaker: Secondary | ICD-10-CM | POA: Diagnosis not present

## 2019-02-23 DIAGNOSIS — I4819 Other persistent atrial fibrillation: Secondary | ICD-10-CM | POA: Diagnosis not present

## 2019-02-23 DIAGNOSIS — Z96642 Presence of left artificial hip joint: Secondary | ICD-10-CM | POA: Diagnosis not present

## 2019-02-23 DIAGNOSIS — K219 Gastro-esophageal reflux disease without esophagitis: Secondary | ICD-10-CM | POA: Diagnosis not present

## 2019-02-23 DIAGNOSIS — I441 Atrioventricular block, second degree: Secondary | ICD-10-CM

## 2019-02-23 DIAGNOSIS — R338 Other retention of urine: Secondary | ICD-10-CM | POA: Diagnosis not present

## 2019-02-23 DIAGNOSIS — I2510 Atherosclerotic heart disease of native coronary artery without angina pectoris: Secondary | ICD-10-CM | POA: Diagnosis not present

## 2019-02-23 DIAGNOSIS — G8929 Other chronic pain: Secondary | ICD-10-CM | POA: Diagnosis not present

## 2019-02-23 DIAGNOSIS — I130 Hypertensive heart and chronic kidney disease with heart failure and stage 1 through stage 4 chronic kidney disease, or unspecified chronic kidney disease: Secondary | ICD-10-CM | POA: Diagnosis not present

## 2019-02-23 DIAGNOSIS — S72012D Unspecified intracapsular fracture of left femur, subsequent encounter for closed fracture with routine healing: Secondary | ICD-10-CM | POA: Diagnosis not present

## 2019-02-23 DIAGNOSIS — Z466 Encounter for fitting and adjustment of urinary device: Secondary | ICD-10-CM | POA: Diagnosis not present

## 2019-02-23 NOTE — Telephone Encounter (Signed)
° ° ° ° °  Pt c/o medication issue:  1. Name of Medication:  Carvedilol, Losartan, Eliquis, amiodarone  2. How are you currently taking this medication (dosage and times per day)? Clarification needed  3. Are you having a reaction (difficulty breathing--STAT)? no  4. What is your medication issue? Patient's son calling to clarify med list, after patient was discharged from the hospital medication dosages were changed. Please clarify

## 2019-02-23 NOTE — Telephone Encounter (Signed)
Pt recently hospitalized and then went to rehab facility.  Previous Amio instructions were take 200mg  BID until 9/3 and then titrate to one tablet.  Pt was discharged from [facility]ha on 9/17 with instructions to take Amiodarone 200mg  BID until 10/17 and no further instructions given.  Pt's son calling to clarify what pt is to do after 10/17.  Advised I will send message to Dr. [name] for review.

## 2019-02-24 NOTE — Telephone Encounter (Signed)
Left message to call back  

## 2019-02-24 NOTE — Telephone Encounter (Signed)
Decrease amiodarone to 1 tablet daily, 200 mg.

## 2019-02-25 DIAGNOSIS — R338 Other retention of urine: Secondary | ICD-10-CM | POA: Diagnosis not present

## 2019-02-25 DIAGNOSIS — N401 Enlarged prostate with lower urinary tract symptoms: Secondary | ICD-10-CM | POA: Diagnosis not present

## 2019-02-25 MED ORDER — AMIODARONE HCL 200 MG PO TABS: 200.0000 mg | ORAL_TABLET | Freq: Every day | ORAL | 3 refills | Status: AC

## 2019-02-25 NOTE — Telephone Encounter (Signed)
Spoke with pt's son and went over recommendations per Dr. [name].  Son verbalized understanding and was appreciative for call.

## 2019-02-26 DIAGNOSIS — Z466 Encounter for fitting and adjustment of urinary device: Secondary | ICD-10-CM | POA: Diagnosis not present

## 2019-02-26 DIAGNOSIS — G8929 Other chronic pain: Secondary | ICD-10-CM | POA: Diagnosis not present

## 2019-02-26 DIAGNOSIS — I5042 Chronic combined systolic (congestive) and diastolic (congestive) heart failure: Secondary | ICD-10-CM | POA: Diagnosis not present

## 2019-02-26 DIAGNOSIS — S72012D Unspecified intracapsular fracture of left femur, subsequent encounter for closed fracture with routine healing: Secondary | ICD-10-CM | POA: Diagnosis not present

## 2019-02-26 DIAGNOSIS — R338 Other retention of urine: Secondary | ICD-10-CM | POA: Diagnosis not present

## 2019-02-26 DIAGNOSIS — N400 Enlarged prostate without lower urinary tract symptoms: Secondary | ICD-10-CM | POA: Diagnosis not present

## 2019-02-26 DIAGNOSIS — N183 Chronic kidney disease, stage 3 unspecified: Secondary | ICD-10-CM | POA: Diagnosis not present

## 2019-02-26 DIAGNOSIS — I130 Hypertensive heart and chronic kidney disease with heart failure and stage 1 through stage 4 chronic kidney disease, or unspecified chronic kidney disease: Secondary | ICD-10-CM | POA: Diagnosis not present

## 2019-02-26 DIAGNOSIS — I2510 Atherosclerotic heart disease of native coronary artery without angina pectoris: Secondary | ICD-10-CM | POA: Diagnosis not present

## 2019-03-02 DIAGNOSIS — Z466 Encounter for fitting and adjustment of urinary device: Secondary | ICD-10-CM | POA: Diagnosis not present

## 2019-03-02 DIAGNOSIS — I2510 Atherosclerotic heart disease of native coronary artery without angina pectoris: Secondary | ICD-10-CM | POA: Diagnosis not present

## 2019-03-02 DIAGNOSIS — N183 Chronic kidney disease, stage 3 unspecified: Secondary | ICD-10-CM | POA: Diagnosis not present

## 2019-03-02 DIAGNOSIS — G8929 Other chronic pain: Secondary | ICD-10-CM | POA: Diagnosis not present

## 2019-03-02 DIAGNOSIS — I130 Hypertensive heart and chronic kidney disease with heart failure and stage 1 through stage 4 chronic kidney disease, or unspecified chronic kidney disease: Secondary | ICD-10-CM | POA: Diagnosis not present

## 2019-03-02 DIAGNOSIS — I5042 Chronic combined systolic (congestive) and diastolic (congestive) heart failure: Secondary | ICD-10-CM | POA: Diagnosis not present

## 2019-03-02 DIAGNOSIS — R338 Other retention of urine: Secondary | ICD-10-CM | POA: Diagnosis not present

## 2019-03-02 DIAGNOSIS — S72012D Unspecified intracapsular fracture of left femur, subsequent encounter for closed fracture with routine healing: Secondary | ICD-10-CM | POA: Diagnosis not present

## 2019-03-02 DIAGNOSIS — N400 Enlarged prostate without lower urinary tract symptoms: Secondary | ICD-10-CM | POA: Diagnosis not present

## 2019-03-03 DIAGNOSIS — R338 Other retention of urine: Secondary | ICD-10-CM | POA: Diagnosis not present

## 2019-03-03 DIAGNOSIS — I5042 Chronic combined systolic (congestive) and diastolic (congestive) heart failure: Secondary | ICD-10-CM | POA: Diagnosis not present

## 2019-03-03 DIAGNOSIS — N400 Enlarged prostate without lower urinary tract symptoms: Secondary | ICD-10-CM | POA: Diagnosis not present

## 2019-03-03 DIAGNOSIS — Z466 Encounter for fitting and adjustment of urinary device: Secondary | ICD-10-CM | POA: Diagnosis not present

## 2019-03-03 DIAGNOSIS — I130 Hypertensive heart and chronic kidney disease with heart failure and stage 1 through stage 4 chronic kidney disease, or unspecified chronic kidney disease: Secondary | ICD-10-CM | POA: Diagnosis not present

## 2019-03-03 DIAGNOSIS — N183 Chronic kidney disease, stage 3 unspecified: Secondary | ICD-10-CM | POA: Diagnosis not present

## 2019-03-03 DIAGNOSIS — S72012D Unspecified intracapsular fracture of left femur, subsequent encounter for closed fracture with routine healing: Secondary | ICD-10-CM | POA: Diagnosis not present

## 2019-03-03 DIAGNOSIS — G8929 Other chronic pain: Secondary | ICD-10-CM | POA: Diagnosis not present

## 2019-03-03 DIAGNOSIS — I2510 Atherosclerotic heart disease of native coronary artery without angina pectoris: Secondary | ICD-10-CM | POA: Diagnosis not present

## 2019-03-04 DIAGNOSIS — G8929 Other chronic pain: Secondary | ICD-10-CM | POA: Diagnosis not present

## 2019-03-04 DIAGNOSIS — I130 Hypertensive heart and chronic kidney disease with heart failure and stage 1 through stage 4 chronic kidney disease, or unspecified chronic kidney disease: Secondary | ICD-10-CM | POA: Diagnosis not present

## 2019-03-04 DIAGNOSIS — I2510 Atherosclerotic heart disease of native coronary artery without angina pectoris: Secondary | ICD-10-CM | POA: Diagnosis not present

## 2019-03-04 DIAGNOSIS — R338 Other retention of urine: Secondary | ICD-10-CM | POA: Diagnosis not present

## 2019-03-04 DIAGNOSIS — N183 Chronic kidney disease, stage 3 unspecified: Secondary | ICD-10-CM | POA: Diagnosis not present

## 2019-03-04 DIAGNOSIS — I5042 Chronic combined systolic (congestive) and diastolic (congestive) heart failure: Secondary | ICD-10-CM | POA: Diagnosis not present

## 2019-03-04 DIAGNOSIS — Z466 Encounter for fitting and adjustment of urinary device: Secondary | ICD-10-CM | POA: Diagnosis not present

## 2019-03-04 DIAGNOSIS — N400 Enlarged prostate without lower urinary tract symptoms: Secondary | ICD-10-CM | POA: Diagnosis not present

## 2019-03-04 DIAGNOSIS — S72012D Unspecified intracapsular fracture of left femur, subsequent encounter for closed fracture with routine healing: Secondary | ICD-10-CM | POA: Diagnosis not present

## 2019-03-05 ENCOUNTER — Other Ambulatory Visit: Payer: Self-pay | Admitting: Interventional Cardiology

## 2019-03-05 DIAGNOSIS — S50811A Abrasion of right forearm, initial encounter: Secondary | ICD-10-CM | POA: Diagnosis not present

## 2019-03-05 DIAGNOSIS — N400 Enlarged prostate without lower urinary tract symptoms: Secondary | ICD-10-CM | POA: Diagnosis not present

## 2019-03-05 DIAGNOSIS — S72012D Unspecified intracapsular fracture of left femur, subsequent encounter for closed fracture with routine healing: Secondary | ICD-10-CM | POA: Diagnosis not present

## 2019-03-05 DIAGNOSIS — N183 Chronic kidney disease, stage 3 unspecified: Secondary | ICD-10-CM | POA: Diagnosis not present

## 2019-03-05 DIAGNOSIS — I2510 Atherosclerotic heart disease of native coronary artery without angina pectoris: Secondary | ICD-10-CM | POA: Diagnosis not present

## 2019-03-05 DIAGNOSIS — I130 Hypertensive heart and chronic kidney disease with heart failure and stage 1 through stage 4 chronic kidney disease, or unspecified chronic kidney disease: Secondary | ICD-10-CM | POA: Diagnosis not present

## 2019-03-05 DIAGNOSIS — Z466 Encounter for fitting and adjustment of urinary device: Secondary | ICD-10-CM | POA: Diagnosis not present

## 2019-03-05 DIAGNOSIS — I5042 Chronic combined systolic (congestive) and diastolic (congestive) heart failure: Secondary | ICD-10-CM | POA: Diagnosis not present

## 2019-03-05 DIAGNOSIS — R338 Other retention of urine: Secondary | ICD-10-CM | POA: Diagnosis not present

## 2019-03-05 DIAGNOSIS — S1191XA Laceration without foreign body of unspecified part of neck, initial encounter: Secondary | ICD-10-CM | POA: Diagnosis not present

## 2019-03-05 DIAGNOSIS — G8929 Other chronic pain: Secondary | ICD-10-CM | POA: Diagnosis not present

## 2019-03-05 MED ORDER — CARVEDILOL 3.125 MG PO TABS: 3.1250 mg | ORAL_TABLET | Freq: Two times a day (BID) | ORAL | 3 refills | Status: AC

## 2019-03-05 NOTE — Telephone Encounter (Signed)
Pt's medication was sent to pt's pharmacy as requested. Confirmation received.  °

## 2019-03-07 ENCOUNTER — Other Ambulatory Visit: Payer: Self-pay | Admitting: Interventional Cardiology

## 2019-03-07 ENCOUNTER — Other Ambulatory Visit: Payer: Self-pay | Admitting: Cardiology

## 2019-03-08 DIAGNOSIS — I129 Hypertensive chronic kidney disease with stage 1 through stage 4 chronic kidney disease, or unspecified chronic kidney disease: Secondary | ICD-10-CM | POA: Diagnosis not present

## 2019-03-08 DIAGNOSIS — N390 Urinary tract infection, site not specified: Secondary | ICD-10-CM | POA: Diagnosis not present

## 2019-03-08 DIAGNOSIS — R319 Hematuria, unspecified: Secondary | ICD-10-CM | POA: Diagnosis not present

## 2019-03-08 DIAGNOSIS — Z87891 Personal history of nicotine dependence: Secondary | ICD-10-CM | POA: Diagnosis not present

## 2019-03-08 DIAGNOSIS — N189 Chronic kidney disease, unspecified: Secondary | ICD-10-CM | POA: Diagnosis not present

## 2019-03-08 DIAGNOSIS — R338 Other retention of urine: Secondary | ICD-10-CM | POA: Diagnosis not present

## 2019-03-08 DIAGNOSIS — N401 Enlarged prostate with lower urinary tract symptoms: Secondary | ICD-10-CM | POA: Diagnosis not present

## 2019-03-08 DIAGNOSIS — R351 Nocturia: Secondary | ICD-10-CM | POA: Diagnosis not present

## 2019-03-08 NOTE — Progress Notes (Signed)
Remote pacemaker transmission.   

## 2019-03-09 DIAGNOSIS — N400 Enlarged prostate without lower urinary tract symptoms: Secondary | ICD-10-CM | POA: Diagnosis not present

## 2019-03-09 DIAGNOSIS — I130 Hypertensive heart and chronic kidney disease with heart failure and stage 1 through stage 4 chronic kidney disease, or unspecified chronic kidney disease: Secondary | ICD-10-CM | POA: Diagnosis not present

## 2019-03-09 DIAGNOSIS — N401 Enlarged prostate with lower urinary tract symptoms: Secondary | ICD-10-CM | POA: Diagnosis not present

## 2019-03-09 DIAGNOSIS — G8929 Other chronic pain: Secondary | ICD-10-CM | POA: Diagnosis not present

## 2019-03-09 DIAGNOSIS — Z466 Encounter for fitting and adjustment of urinary device: Secondary | ICD-10-CM | POA: Diagnosis not present

## 2019-03-09 DIAGNOSIS — N183 Chronic kidney disease, stage 3 unspecified: Secondary | ICD-10-CM | POA: Diagnosis not present

## 2019-03-09 DIAGNOSIS — I5042 Chronic combined systolic (congestive) and diastolic (congestive) heart failure: Secondary | ICD-10-CM | POA: Diagnosis not present

## 2019-03-09 DIAGNOSIS — I2510 Atherosclerotic heart disease of native coronary artery without angina pectoris: Secondary | ICD-10-CM | POA: Diagnosis not present

## 2019-03-09 DIAGNOSIS — R338 Other retention of urine: Secondary | ICD-10-CM | POA: Diagnosis not present

## 2019-03-09 DIAGNOSIS — S72012D Unspecified intracapsular fracture of left femur, subsequent encounter for closed fracture with routine healing: Secondary | ICD-10-CM | POA: Diagnosis not present

## 2019-03-09 DIAGNOSIS — R310 Gross hematuria: Secondary | ICD-10-CM | POA: Diagnosis not present

## 2019-03-10 DIAGNOSIS — S72002D Fracture of unspecified part of neck of left femur, subsequent encounter for closed fracture with routine healing: Secondary | ICD-10-CM | POA: Diagnosis not present

## 2019-03-11 DIAGNOSIS — I5042 Chronic combined systolic (congestive) and diastolic (congestive) heart failure: Secondary | ICD-10-CM | POA: Diagnosis not present

## 2019-03-11 DIAGNOSIS — G8929 Other chronic pain: Secondary | ICD-10-CM | POA: Diagnosis not present

## 2019-03-11 DIAGNOSIS — N183 Chronic kidney disease, stage 3 unspecified: Secondary | ICD-10-CM | POA: Diagnosis not present

## 2019-03-11 DIAGNOSIS — S72012D Unspecified intracapsular fracture of left femur, subsequent encounter for closed fracture with routine healing: Secondary | ICD-10-CM | POA: Diagnosis not present

## 2019-03-11 DIAGNOSIS — I2510 Atherosclerotic heart disease of native coronary artery without angina pectoris: Secondary | ICD-10-CM | POA: Diagnosis not present

## 2019-03-11 DIAGNOSIS — R338 Other retention of urine: Secondary | ICD-10-CM | POA: Diagnosis not present

## 2019-03-11 DIAGNOSIS — I130 Hypertensive heart and chronic kidney disease with heart failure and stage 1 through stage 4 chronic kidney disease, or unspecified chronic kidney disease: Secondary | ICD-10-CM | POA: Diagnosis not present

## 2019-03-11 DIAGNOSIS — Z466 Encounter for fitting and adjustment of urinary device: Secondary | ICD-10-CM | POA: Diagnosis not present

## 2019-03-11 DIAGNOSIS — N400 Enlarged prostate without lower urinary tract symptoms: Secondary | ICD-10-CM | POA: Diagnosis not present

## 2019-03-12 DIAGNOSIS — N183 Chronic kidney disease, stage 3 unspecified: Secondary | ICD-10-CM | POA: Diagnosis not present

## 2019-03-12 DIAGNOSIS — I130 Hypertensive heart and chronic kidney disease with heart failure and stage 1 through stage 4 chronic kidney disease, or unspecified chronic kidney disease: Secondary | ICD-10-CM | POA: Diagnosis not present

## 2019-03-12 DIAGNOSIS — T07XXXA Unspecified multiple injuries, initial encounter: Secondary | ICD-10-CM | POA: Diagnosis not present

## 2019-03-12 DIAGNOSIS — Z466 Encounter for fitting and adjustment of urinary device: Secondary | ICD-10-CM | POA: Diagnosis not present

## 2019-03-12 DIAGNOSIS — I5042 Chronic combined systolic (congestive) and diastolic (congestive) heart failure: Secondary | ICD-10-CM | POA: Diagnosis not present

## 2019-03-12 DIAGNOSIS — R338 Other retention of urine: Secondary | ICD-10-CM | POA: Diagnosis not present

## 2019-03-12 DIAGNOSIS — S72012D Unspecified intracapsular fracture of left femur, subsequent encounter for closed fracture with routine healing: Secondary | ICD-10-CM | POA: Diagnosis not present

## 2019-03-12 DIAGNOSIS — I2510 Atherosclerotic heart disease of native coronary artery without angina pectoris: Secondary | ICD-10-CM | POA: Diagnosis not present

## 2019-03-12 DIAGNOSIS — N400 Enlarged prostate without lower urinary tract symptoms: Secondary | ICD-10-CM | POA: Diagnosis not present

## 2019-03-12 DIAGNOSIS — G8929 Other chronic pain: Secondary | ICD-10-CM | POA: Diagnosis not present

## 2019-03-12 DIAGNOSIS — N1831 Chronic kidney disease, stage 3a: Secondary | ICD-10-CM | POA: Diagnosis not present

## 2019-03-16 DIAGNOSIS — N183 Chronic kidney disease, stage 3 unspecified: Secondary | ICD-10-CM | POA: Diagnosis not present

## 2019-03-16 DIAGNOSIS — N400 Enlarged prostate without lower urinary tract symptoms: Secondary | ICD-10-CM | POA: Diagnosis not present

## 2019-03-16 DIAGNOSIS — Z466 Encounter for fitting and adjustment of urinary device: Secondary | ICD-10-CM | POA: Diagnosis not present

## 2019-03-16 DIAGNOSIS — I5042 Chronic combined systolic (congestive) and diastolic (congestive) heart failure: Secondary | ICD-10-CM | POA: Diagnosis not present

## 2019-03-16 DIAGNOSIS — R338 Other retention of urine: Secondary | ICD-10-CM | POA: Diagnosis not present

## 2019-03-16 DIAGNOSIS — G8929 Other chronic pain: Secondary | ICD-10-CM | POA: Diagnosis not present

## 2019-03-16 DIAGNOSIS — I2510 Atherosclerotic heart disease of native coronary artery without angina pectoris: Secondary | ICD-10-CM | POA: Diagnosis not present

## 2019-03-16 DIAGNOSIS — S72012D Unspecified intracapsular fracture of left femur, subsequent encounter for closed fracture with routine healing: Secondary | ICD-10-CM | POA: Diagnosis not present

## 2019-03-16 DIAGNOSIS — I130 Hypertensive heart and chronic kidney disease with heart failure and stage 1 through stage 4 chronic kidney disease, or unspecified chronic kidney disease: Secondary | ICD-10-CM | POA: Diagnosis not present

## 2019-03-17 DIAGNOSIS — R338 Other retention of urine: Secondary | ICD-10-CM | POA: Diagnosis not present

## 2019-03-17 DIAGNOSIS — R310 Gross hematuria: Secondary | ICD-10-CM | POA: Diagnosis not present

## 2019-03-18 DIAGNOSIS — G8929 Other chronic pain: Secondary | ICD-10-CM | POA: Diagnosis not present

## 2019-03-18 DIAGNOSIS — I5042 Chronic combined systolic (congestive) and diastolic (congestive) heart failure: Secondary | ICD-10-CM | POA: Diagnosis not present

## 2019-03-18 DIAGNOSIS — Z466 Encounter for fitting and adjustment of urinary device: Secondary | ICD-10-CM | POA: Diagnosis not present

## 2019-03-18 DIAGNOSIS — I130 Hypertensive heart and chronic kidney disease with heart failure and stage 1 through stage 4 chronic kidney disease, or unspecified chronic kidney disease: Secondary | ICD-10-CM | POA: Diagnosis not present

## 2019-03-18 DIAGNOSIS — S72012D Unspecified intracapsular fracture of left femur, subsequent encounter for closed fracture with routine healing: Secondary | ICD-10-CM | POA: Diagnosis not present

## 2019-03-18 DIAGNOSIS — N400 Enlarged prostate without lower urinary tract symptoms: Secondary | ICD-10-CM | POA: Diagnosis not present

## 2019-03-18 DIAGNOSIS — R338 Other retention of urine: Secondary | ICD-10-CM | POA: Diagnosis not present

## 2019-03-18 DIAGNOSIS — N183 Chronic kidney disease, stage 3 unspecified: Secondary | ICD-10-CM | POA: Diagnosis not present

## 2019-03-18 DIAGNOSIS — I2510 Atherosclerotic heart disease of native coronary artery without angina pectoris: Secondary | ICD-10-CM | POA: Diagnosis not present

## 2019-03-19 ENCOUNTER — Telehealth: Payer: Self-pay | Admitting: *Deleted

## 2019-03-19 DIAGNOSIS — N183 Chronic kidney disease, stage 3 unspecified: Secondary | ICD-10-CM | POA: Diagnosis not present

## 2019-03-19 DIAGNOSIS — N400 Enlarged prostate without lower urinary tract symptoms: Secondary | ICD-10-CM | POA: Diagnosis not present

## 2019-03-19 DIAGNOSIS — I5022 Chronic systolic (congestive) heart failure: Secondary | ICD-10-CM | POA: Diagnosis not present

## 2019-03-19 DIAGNOSIS — R05 Cough: Secondary | ICD-10-CM | POA: Diagnosis not present

## 2019-03-19 DIAGNOSIS — G8929 Other chronic pain: Secondary | ICD-10-CM | POA: Diagnosis not present

## 2019-03-19 DIAGNOSIS — R5383 Other fatigue: Secondary | ICD-10-CM | POA: Diagnosis not present

## 2019-03-19 DIAGNOSIS — J449 Chronic obstructive pulmonary disease, unspecified: Secondary | ICD-10-CM | POA: Diagnosis not present

## 2019-03-19 DIAGNOSIS — I2510 Atherosclerotic heart disease of native coronary artery without angina pectoris: Secondary | ICD-10-CM | POA: Diagnosis not present

## 2019-03-19 DIAGNOSIS — R338 Other retention of urine: Secondary | ICD-10-CM | POA: Diagnosis not present

## 2019-03-19 DIAGNOSIS — S72012D Unspecified intracapsular fracture of left femur, subsequent encounter for closed fracture with routine healing: Secondary | ICD-10-CM | POA: Diagnosis not present

## 2019-03-19 DIAGNOSIS — R296 Repeated falls: Secondary | ICD-10-CM | POA: Diagnosis not present

## 2019-03-19 DIAGNOSIS — I5042 Chronic combined systolic (congestive) and diastolic (congestive) heart failure: Secondary | ICD-10-CM | POA: Diagnosis not present

## 2019-03-19 DIAGNOSIS — Z466 Encounter for fitting and adjustment of urinary device: Secondary | ICD-10-CM | POA: Diagnosis not present

## 2019-03-19 DIAGNOSIS — R54 Age-related physical debility: Secondary | ICD-10-CM | POA: Diagnosis not present

## 2019-03-19 DIAGNOSIS — I130 Hypertensive heart and chronic kidney disease with heart failure and stage 1 through stage 4 chronic kidney disease, or unspecified chronic kidney disease: Secondary | ICD-10-CM | POA: Diagnosis not present

## 2019-03-19 NOTE — Telephone Encounter (Signed)
lmtcb

## 2019-03-19 NOTE — Telephone Encounter (Signed)
-----   Message from Will [name], MD sent at 02/25/2019 10:24 AM EDT ----- Abnormal device interrogation reviewed.  Lead parameters and battery status stable.  In AF. If symptomatic will need to follow up with AF clinic or EP APP to discuss cardioversion.

## 2019-03-23 DIAGNOSIS — I2510 Atherosclerotic heart disease of native coronary artery without angina pectoris: Secondary | ICD-10-CM | POA: Diagnosis not present

## 2019-03-23 DIAGNOSIS — I130 Hypertensive heart and chronic kidney disease with heart failure and stage 1 through stage 4 chronic kidney disease, or unspecified chronic kidney disease: Secondary | ICD-10-CM | POA: Diagnosis not present

## 2019-03-23 DIAGNOSIS — I5042 Chronic combined systolic (congestive) and diastolic (congestive) heart failure: Secondary | ICD-10-CM | POA: Diagnosis not present

## 2019-03-23 DIAGNOSIS — R338 Other retention of urine: Secondary | ICD-10-CM | POA: Diagnosis not present

## 2019-03-23 DIAGNOSIS — N183 Chronic kidney disease, stage 3 unspecified: Secondary | ICD-10-CM | POA: Diagnosis not present

## 2019-03-23 DIAGNOSIS — G8929 Other chronic pain: Secondary | ICD-10-CM | POA: Diagnosis not present

## 2019-03-23 DIAGNOSIS — N400 Enlarged prostate without lower urinary tract symptoms: Secondary | ICD-10-CM | POA: Diagnosis not present

## 2019-03-23 DIAGNOSIS — S72012D Unspecified intracapsular fracture of left femur, subsequent encounter for closed fracture with routine healing: Secondary | ICD-10-CM | POA: Diagnosis not present

## 2019-03-23 DIAGNOSIS — Z466 Encounter for fitting and adjustment of urinary device: Secondary | ICD-10-CM | POA: Diagnosis not present

## 2019-03-24 DIAGNOSIS — S72002D Fracture of unspecified part of neck of left femur, subsequent encounter for closed fracture with routine healing: Secondary | ICD-10-CM | POA: Diagnosis not present

## 2019-03-24 DIAGNOSIS — I5042 Chronic combined systolic (congestive) and diastolic (congestive) heart failure: Secondary | ICD-10-CM | POA: Diagnosis not present

## 2019-03-24 DIAGNOSIS — G8929 Other chronic pain: Secondary | ICD-10-CM | POA: Diagnosis not present

## 2019-03-24 DIAGNOSIS — Z466 Encounter for fitting and adjustment of urinary device: Secondary | ICD-10-CM | POA: Diagnosis not present

## 2019-03-24 DIAGNOSIS — N400 Enlarged prostate without lower urinary tract symptoms: Secondary | ICD-10-CM | POA: Diagnosis not present

## 2019-03-24 DIAGNOSIS — R338 Other retention of urine: Secondary | ICD-10-CM | POA: Diagnosis not present

## 2019-03-24 DIAGNOSIS — I2510 Atherosclerotic heart disease of native coronary artery without angina pectoris: Secondary | ICD-10-CM | POA: Diagnosis not present

## 2019-03-24 DIAGNOSIS — N183 Chronic kidney disease, stage 3 unspecified: Secondary | ICD-10-CM | POA: Diagnosis not present

## 2019-03-24 DIAGNOSIS — S72012D Unspecified intracapsular fracture of left femur, subsequent encounter for closed fracture with routine healing: Secondary | ICD-10-CM | POA: Diagnosis not present

## 2019-03-24 DIAGNOSIS — I130 Hypertensive heart and chronic kidney disease with heart failure and stage 1 through stage 4 chronic kidney disease, or unspecified chronic kidney disease: Secondary | ICD-10-CM | POA: Diagnosis not present

## 2019-03-25 DIAGNOSIS — K219 Gastro-esophageal reflux disease without esophagitis: Secondary | ICD-10-CM | POA: Diagnosis not present

## 2019-03-25 DIAGNOSIS — N183 Chronic kidney disease, stage 3 unspecified: Secondary | ICD-10-CM | POA: Diagnosis not present

## 2019-03-25 DIAGNOSIS — I2510 Atherosclerotic heart disease of native coronary artery without angina pectoris: Secondary | ICD-10-CM | POA: Diagnosis not present

## 2019-03-25 DIAGNOSIS — I4819 Other persistent atrial fibrillation: Secondary | ICD-10-CM | POA: Diagnosis not present

## 2019-03-25 DIAGNOSIS — N400 Enlarged prostate without lower urinary tract symptoms: Secondary | ICD-10-CM | POA: Diagnosis not present

## 2019-03-25 DIAGNOSIS — G8929 Other chronic pain: Secondary | ICD-10-CM | POA: Diagnosis not present

## 2019-03-25 DIAGNOSIS — G629 Polyneuropathy, unspecified: Secondary | ICD-10-CM | POA: Diagnosis not present

## 2019-03-25 DIAGNOSIS — Z950 Presence of cardiac pacemaker: Secondary | ICD-10-CM | POA: Diagnosis not present

## 2019-03-25 DIAGNOSIS — Z96642 Presence of left artificial hip joint: Secondary | ICD-10-CM | POA: Diagnosis not present

## 2019-03-25 DIAGNOSIS — I130 Hypertensive heart and chronic kidney disease with heart failure and stage 1 through stage 4 chronic kidney disease, or unspecified chronic kidney disease: Secondary | ICD-10-CM | POA: Diagnosis not present

## 2019-03-25 DIAGNOSIS — R338 Other retention of urine: Secondary | ICD-10-CM | POA: Diagnosis not present

## 2019-03-25 DIAGNOSIS — I5042 Chronic combined systolic (congestive) and diastolic (congestive) heart failure: Secondary | ICD-10-CM | POA: Diagnosis not present

## 2019-03-25 DIAGNOSIS — W19XXXD Unspecified fall, subsequent encounter: Secondary | ICD-10-CM | POA: Diagnosis not present

## 2019-03-25 DIAGNOSIS — S72012D Unspecified intracapsular fracture of left femur, subsequent encounter for closed fracture with routine healing: Secondary | ICD-10-CM | POA: Diagnosis not present

## 2019-03-25 DIAGNOSIS — Z466 Encounter for fitting and adjustment of urinary device: Secondary | ICD-10-CM | POA: Diagnosis not present

## 2019-03-26 DIAGNOSIS — I5042 Chronic combined systolic (congestive) and diastolic (congestive) heart failure: Secondary | ICD-10-CM | POA: Diagnosis not present

## 2019-03-26 DIAGNOSIS — Z466 Encounter for fitting and adjustment of urinary device: Secondary | ICD-10-CM | POA: Diagnosis not present

## 2019-03-26 DIAGNOSIS — R443 Hallucinations, unspecified: Secondary | ICD-10-CM | POA: Diagnosis not present

## 2019-03-26 DIAGNOSIS — G8929 Other chronic pain: Secondary | ICD-10-CM | POA: Diagnosis not present

## 2019-03-26 DIAGNOSIS — I2510 Atherosclerotic heart disease of native coronary artery without angina pectoris: Secondary | ICD-10-CM | POA: Diagnosis not present

## 2019-03-26 DIAGNOSIS — N400 Enlarged prostate without lower urinary tract symptoms: Secondary | ICD-10-CM | POA: Diagnosis not present

## 2019-03-26 DIAGNOSIS — Z87440 Personal history of urinary (tract) infections: Secondary | ICD-10-CM | POA: Diagnosis not present

## 2019-03-26 DIAGNOSIS — S72012D Unspecified intracapsular fracture of left femur, subsequent encounter for closed fracture with routine healing: Secondary | ICD-10-CM | POA: Diagnosis not present

## 2019-03-26 DIAGNOSIS — N183 Chronic kidney disease, stage 3 unspecified: Secondary | ICD-10-CM | POA: Diagnosis not present

## 2019-03-26 DIAGNOSIS — R338 Other retention of urine: Secondary | ICD-10-CM | POA: Diagnosis not present

## 2019-03-26 DIAGNOSIS — I130 Hypertensive heart and chronic kidney disease with heart failure and stage 1 through stage 4 chronic kidney disease, or unspecified chronic kidney disease: Secondary | ICD-10-CM | POA: Diagnosis not present

## 2019-03-29 ENCOUNTER — Other Ambulatory Visit: Payer: Self-pay

## 2019-03-29 ENCOUNTER — Ambulatory Visit (INDEPENDENT_AMBULATORY_CARE_PROVIDER_SITE_OTHER): Payer: Medicare Other | Admitting: Cardiology

## 2019-03-29 ENCOUNTER — Encounter: Payer: Self-pay | Admitting: Cardiology

## 2019-03-29 VITALS — BP 106/50 | HR 60 | Ht 69.0 in | Wt 161.4 lb

## 2019-03-29 DIAGNOSIS — I441 Atrioventricular block, second degree: Secondary | ICD-10-CM | POA: Diagnosis not present

## 2019-03-29 DIAGNOSIS — I4819 Other persistent atrial fibrillation: Secondary | ICD-10-CM

## 2019-03-29 DIAGNOSIS — Z01812 Encounter for preprocedural laboratory examination: Secondary | ICD-10-CM | POA: Diagnosis not present

## 2019-03-29 LAB — CUP PACEART INCLINIC DEVICE CHECK
Battery Remaining Longevity: 126 mo
Battery Voltage: 2.98 V
Brady Statistic RA Percent Paced: 0 %
Brady Statistic RV Percent Paced: 91 %
Date Time Interrogation Session: 20201116184442
Implantable Lead Implant Date: 20190927
Implantable Lead Implant Date: 20190927
Implantable Lead Location: 753859
Implantable Lead Location: 753860
Implantable Pulse Generator Implant Date: 20190927
Lead Channel Impedance Value: 462.5 Ohm
Lead Channel Impedance Value: 525 Ohm
Lead Channel Pacing Threshold Amplitude: 0.625 V
Lead Channel Pacing Threshold Pulse Width: 0.5 ms
Lead Channel Sensing Intrinsic Amplitude: 0.8 mV
Lead Channel Sensing Intrinsic Amplitude: 11.4 mV
Lead Channel Setting Pacing Amplitude: 0.875
Lead Channel Setting Pacing Amplitude: 2 V
Lead Channel Setting Pacing Pulse Width: 0.5 ms
Lead Channel Setting Sensing Sensitivity: 2 mV
Pulse Gen Model: 2272
Pulse Gen Serial Number: 9066149

## 2019-03-29 NOTE — Patient Instructions (Addendum)
Medication Instructions:  Your physician has recommended you make the following change in your medication:  1. INCREASE Lasix - double what you usually take for the next 5 days, then return to normal dosing.  * If you need a refill on your cardiac medications before your next appointment, please call your pharmacy.   Labwork: Today: BMET & CBC If you have labs (blood work) drawn today and your tests are completely normal, you will receive your results only by:  [place] (if you have MyChart) OR  A paper copy in the mail If you have any lab test that is abnormal or we need to change your treatment, we will call you to review the results.  Testing/Procedures: Your physician has recommended that you have a Cardioversion (DCCV). Electrical Cardioversion uses a jolt of electricity to your heart either through paddles or wired patches attached to your chest. This is a controlled, usually prescheduled, procedure. Defibrillation is done under light anesthesia in the hospital, and you usually go home the day of the procedure. This is done to get your heart back into a normal rhythm. You are not awake for the procedure. Please see the instruction below.  Follow-Up: At [facility], you and your health needs are our priority.  As part of our continuing mission to provide you with exceptional heart care, we have created designated Provider Care Teams.  These Care Teams include your primary Cardiologist (physician) and Advanced Practice Providers (APPs -  Physician Assistants and Nurse Practitioners) who all work together to provide you with the care you need, when you need it.  You will need a follow up appointment in 3 months.  Please call our office 2 months in advance to schedule this appointment.  You may see Dr Curt Bears or one of the following Advanced Practice Providers on your designated Care Team:    Chanetta Marshall, NP  [name], PA-C  Oda Kilts, [place]  Thank you for choosing [facility]!!   Trinidad Curet, RN [phone]  Any Other Special Instructions Will Be Listed Below (If Applicable).  CARDIOVERSION INSTRUCTIONS:  COVID TEST-- On Friday, 04/02/19 @ 11:00 am - You will go to [facility] hospital ([place]) for your Covid testing.   This is a drive thru test site.  There will be multiple testing areas.  Be sure to share with the first checkpoint that you are there for pre-procedure/surgery testing. This will put you into the right (yellow) lane that leads to the PAT testing team. Stay in your car and the nurse team will come to your car to test you.  After you are tested please go home and self quarantine until the day of your procedure.  .   You are scheduled for a cardioversion on 04/05/19 with Dr. Audie Box or associates. Please go to [facility] at 7:30 am , Enter through the [place] not have any food or drink after midnight the night before.  DO NOT take your Lasix or Flomax the morning of this procedure. You may take your remaining medicines with a sip of water on the day of your procedure.  You will need someone to drive you home following your procedure.   Call the [place] office at [phone] if you have any questions, problems or concerns.      Electrical Cardioversion Electrical cardioversion is the delivery of a jolt of electricity to change the rhythm of the heart. Sticky patches  or metal paddles are placed on the chest to deliver the electricity from a device. This is done to restore a normal rhythm. A rhythm that is too fast or not regular keeps the heart from pumping well. Electrical cardioversion is done in an emergency if:   There is low or no blood pressure as a result of the heart rhythm.    Normal rhythm must be restored as fast as possible to protect the brain and heart from further damage.    It may save a life. Cardioversion may be done for heart rhythms that  are not immediately life threatening, such as atrial fibrillation or flutter, in which:   The heart is beating too fast or is not regular.    Medicine to change the rhythm has not worked.    It is safe to wait in order to allow time for preparation.  Symptoms of the abnormal rhythm are bothersome.  The risk of stroke and other serious problems can be reduced.  LET [facility] CARE PROVIDER KNOW ABOUT:   Any allergies you have.  All medicines you are taking, including vitamins, herbs, eye drops, creams, and over-the-counter medicines.  Previous problems you or members of your family have had with the use of anesthetics.    Any blood disorders you have.    Previous surgeries you have had.    Medical conditions you have.   RISKS AND COMPLICATIONS  Generally, this is a safe procedure. However, problems can occur and include:   Breathing problems related to the anesthetic used.  A blood clot that breaks free and travels to other parts of your body. This could cause a stroke or other problems. The risk of this is lowered by use of blood-thinning medicine (anticoagulant) prior to the procedure.  Cardiac arrest (rare).   BEFORE THE PROCEDURE   You may have tests to detect blood clots in your heart and to evaluate heart function.   You may start taking anticoagulants so your blood does not clot as easily.    Medicines may be given to help stabilize your heart rate and rhythm.   PROCEDURE  You will be given medicine through an IV tube to reduce discomfort and make you sleepy (sedative).    An electrical shock will be delivered.   AFTER THE PROCEDURE Your heart rhythm will be watched to make sure it does not change. You will need someone to drive you home.

## 2019-03-29 NOTE — Addendum Note (Signed)
Addended by: Eulis Foster on: 03/29/2019 03:19 PM   Modules accepted: Orders

## 2019-03-29 NOTE — Progress Notes (Signed)
Electrophysiology Office Note   Date:  03/29/2019   ID:  Frank Elliott, DOB 12-Nov-1925, MRN [id number]  PCP:  Frank Melter, MD  Cardiologist:  [name] Primary Electrophysiologist:  Frank Qin [name], MD    No chief complaint on file.    History of Present Illness: Frank Elliott is a 85 y.o. male who is being seen today for the evaluation of 2:1 AV block at the request of Frank Melter, MD. Presenting today for electrophysiology evaluation.  He has a significant history of chronic systolic heart failure, hypertension, COPD who presented to the hospital September 2019 with syncope and 2-1 AV block.  He is now status post [place] dual-chamber pacemaker implanted 02/06/2018.  Today, denies symptoms of palpitations, chest pain,  orthopnea, PND, claudication, dizziness, presyncope, syncope, bleeding, or neurologic sequela. The patient is tolerating medications without difficulties.  Main symptoms are weakness, fatigue, shortness of breath.  He is not able to do many of his daily activities due to his level of weakness and fatigue.  He has been working with physical therapy, but due to lower extremity edema and increased weight from that, and fatigue from his likely atrial fibrillation, he has not been able to work with him.   Past Medical History:  Diagnosis Date   Arthritis    "just about all over" (02/05/2018)   AV block, Mobitz 2 02/03/2018   PPM placed 01/2018   BPH (benign prostatic hyperplasia)    Chronic combined systolic and diastolic CHF (congestive heart failure) ([place])    Complication of anesthesia    "had hard time waking up a long time ago" (02/05/2018)   Cyst of kidney, acquired    "recently found; ? side" (02/05/2018)   GERD (gastroesophageal reflux disease)    History of hiatal hernia    Hypertension    Mild CAD    a. 30-40% LAD in 2019.   Neuropathy    NICM (nonischemic cardiomyopathy) (HCC)    Persistent atrial fibrillation (HCC)    Pleural  effusion    dx 04/01/12   Skin cancer    "face, nose" (02/05/2018)   Syncope 02/03/2018   Past Surgical History:  Procedure Laterality Date   APPENDECTOMY     BACK SURGERY     CARDIAC CATHETERIZATION  02/05/2018   CATARACT EXTRACTION, BILATERAL Bilateral    CHOLECYSTECTOMY     HIP ARTHROPLASTY Left 01/19/2019   Procedure: ARTHROPLASTY BIPOLAR HIP (HEMIARTHROPLASTY);  Surgeon: Altamese [place]burg, MD;  Location: [facility];  Service: Orthopedics;  Laterality: Left;   JOINT REPLACEMENT     LEFT HEART CATH AND CORONARY ANGIOGRAPHY N/A 02/05/2018   Procedure: LEFT HEART CATH AND CORONARY ANGIOGRAPHY;  Surgeon: Belva Crome, MD;  Location: [facility];  Service: Cardiovascular;  Laterality: N/A;   LUMBAR DISC SURGERY  1980s   PACEMAKER IMPLANT N/A 02/06/2018   Procedure: PACEMAKER IMPLANT;  Surgeon: Constance Haw, MD;  Location: [facility];  Service: Cardiovascular;  Laterality: N/A;   SKIN CANCER EXCISION     "face, nose" (02/05/2018)   TONSILLECTOMY     TOTAL HIP ARTHROPLASTY Right 1990s/2000s   ULTRASOUND GUIDANCE FOR VASCULAR ACCESS  02/05/2018   Procedure: Ultrasound Guidance For Vascular Access;  Surgeon: Belva Crome, MD;  Location: [facility];  Service: Cardiovascular;;     Current Outpatient Medications  Medication Sig Dispense Refill   amiodarone (PACERONE) 200 MG tablet Take 1 tablet (200 mg total) by mouth daily. 90 tablet 3  apixaban (ELIQUIS) 2.5 MG TABS tablet Take 1 tablet (2.5 mg total) by mouth 2 (two) times daily. 60 tablet    betamethasone dipropionate (DIPROLENE) 0.05 % cream Apply 1 application topically 2 (two) times daily as needed (as directed).   1   calcitonin, salmon, (MIACALCIN/FORTICAL) 200 UNIT/ACT nasal spray Place 1 spray into alternate nostrils every evening. 7 PM     carvedilol (COREG) 3.125 MG tablet Take 1 tablet (3.125 mg total) by mouth 2 (two) times daily. 180 tablet 3   cholecalciferol (VITAMIN D) 25 MCG  tablet Take 2 tablets (2,000 Units total) by mouth 2 (two) times daily.     Cyanocobalamin (VITAMIN B-12 PO) Take 1 tablet by mouth daily.     fluticasone (FLONASE) 50 MCG/ACT nasal spray Place 1 spray into both nostrils daily as needed for allergies.      furosemide (LASIX) 20 MG tablet Take as directed 1 tablet tues, wed, fri-Sunday 2 tablets on mon & thursdays May have 1 extra tablet daily as needed for swelling (Patient taking differently: Take 20-40 mg by mouth See admin instructions. Take 20 mg by mouth in the morning every day and a second dose of 20 mg on only Mondays and Thursdays- may take an additional 20 mg once a day as needed for swelling) 45 tablet 11   ketoconazole (NIZORAL) 2 % cream Apply 1 application topically See admin instructions. Apply as directed daily     omeprazole (PRILOSEC) 20 MG capsule Take 20 mg by mouth daily before breakfast.      Probiotic Product (ALIGN PO) Take 1 capsule by mouth daily.     tamsulosin (FLOMAX) 0.4 MG CAPS capsule Take 1 capsule (0.4 mg total) by mouth daily after supper. 30 capsule    No current facility-administered medications for this visit.     Allergies:   Aspirin, Whiskey [alcohol], Amoxicillin, Avelox [moxifloxacin hcl in nacl], Lisinopril, Sulfa antibiotics, Tape, Zithromax [azithromycin], and Penicillins   Social History:  The patient  reports that he has quit smoking. He has never used smokeless tobacco. He reports previous alcohol use. He reports that he does not use drugs.   Family History:  The patient's family history includes Heart disease in his father.    ROS:  Please see the history of present illness.   Otherwise, review of systems is positive for none.   All other systems are reviewed and negative.   PHYSICAL EXAM: VS:  BP (!) 106/50    Pulse 60    Ht 5\' 9"  (1.753 m)    Wt 161 lb 6.4 oz (73.2 kg)    SpO2 97%    BMI 23.83 kg/m  , BMI Body mass index is 23.83 kg/m. GEN: Well nourished, well developed, in no acute  distress  HEENT: normal  Neck: no JVD, carotid bruits, or masses Cardiac: RRR; no murmurs, rubs, or gallops, 2+ edema  Respiratory:  clear to auscultation bilaterally, normal work of breathing GI: soft, nontender, nondistended, + BS MS: no deformity or atrophy  Skin: warm and dry, device site well healed Neuro:  Strength and sensation are intact Psych: euthymic mood, full affect  EKG:  EKG is ordered today. Personal review of the ekg ordered shows atrial fibrillation, ventricular paced  Personal review of the device interrogation today. Results in [facility]: 10/08/2018: NT-Pro BNP 3,397 01/17/2019: ALT 31; B Natriuretic Peptide 385.7 01/18/2019: TSH 0.989 01/23/2019: Magnesium 2.3 01/27/2019: Hemoglobin 10.1; Platelets 302 01/28/2019: BUN 42; Creatinine, Ser 1.51; Potassium 3.7;  Sodium 136    Lipid Panel  No results found for: CHOL, TRIG, HDL, CHOLHDL, VLDL, LDLCALC, LDLDIRECT   Wt Readings from Last 3 Encounters:  03/29/19 161 lb 6.4 oz (73.2 kg)  01/28/19 182 lb 8.7 oz (82.8 kg)  01/13/19 162 lb 1.9 oz (73.5 kg)      Other studies Reviewed: Additional studies/ records that were reviewed today include: TTE 02/04/18  Review of the above records today demonstrates:  - Left ventricle: The cavity size was normal. Wall thickness was   normal. Systolic function was mildly reduced. The estimated   ejection fraction was in the range of 45% to 50%. Basal to mid   inferior hypokinesis. Doppler parameters are consistent with   pseudonormal left ventricular relaxation (grade 2 diastolic   dysfunction). The E/e&' ratio is >15, suggesting elevated LV   filling pressure. - Left atrium: The atrium was normal in size. - Right ventricle: The cavity size was mildly dilated. - Right atrium: Severely dilated. - Tricuspid valve: Could not measure TR jet.  LHC 02/06/18  Right dominant coronary anatomy  Ectatic left main without obstruction.  Luminal irregularities are noted  LAD  contains ectasia proximally and in the mid to distal vessel there is eccentric segmental 30 to 40% narrowing  The circumflex artery gives origin to 3 marginals with the second marginal being the largest.  Luminal irregularities are noted  The RCA is widely patent with luminal irregularities.  Left ventricular end-diastolic pressure is normal at 15 mmHg.  Ventriculography was not performed because of mild chronic kidney disease.  Total contrast, 40 cc   ASSESSMENT AND PLAN:  1.  2-1 AV block: Status post Saint Jude dual-chamber pacemaker.  Device functioning appropriately.  No changes.  2.  Hypertension: Currently well controlled  3.  Chronic diastolic heart failure: No signs of volume overload  4.  Persistent atrial fibrillation: Currently on Eliquis and amiodarone.  Unfortunately he has been in atrial fibrillation and is quite symptomatic.  He has lower extremity edema, weakness, and fatigue.  We Jaleesa Cervi plan for cardioversion.  We Sohana Austell also plan to increase his Lasix dose for the next 5 days.  This patients CHA2DS2-VASc Score and unadjusted Ischemic Stroke Rate (% per year) is equal to 3.2 % stroke rate/year from a score of 3  Above score calculated as 1 point each if present [CHF, HTN, DM, Vascular=MI/PAD/Aortic Plaque, Age if 65-74, or Male] Above score calculated as 2 points each if present [Age > 75, or Stroke/TIA/TE].   Current medicines are reviewed at length with the patient today.   The patient does not have concerns regarding his medicines.  The following changes were made today: None  Labs/ tests ordered today include:  Orders Placed This Encounter  Procedures   Basic metabolic panel   CBC     Disposition:   FU with Devanta Daniel 3 months  Signed, Clint Strupp [name], MD  03/29/2019 3:06 PM     [facility] [address] [place] [address] [phone] (office) [phone] (fax)

## 2019-03-29 NOTE — H&P (View-Only) (Signed)
Electrophysiology Office Note   Date:  03/29/2019   ID:  KENI ARENDT, DOB 1925/12/02, MRN [id number]  PCP:  [name], MD  Cardiologist:  [name] Primary Electrophysiologist:  Verbena Boeding [name], MD    No chief complaint on file.    History of Present Illness: Frank Elliott is a 85 y.o. male who is being seen today for the evaluation of 2:1 AV block at the request of [name], MD. Presenting today for electrophysiology evaluation.  He has a significant history of chronic systolic heart failure, hypertension, COPD who presented to the hospital September 2019 with syncope and 2-1 AV block.  He is now status post [place] dual-chamber pacemaker implanted 02/06/2018.  Today, denies symptoms of palpitations, chest pain,  orthopnea, PND, claudication, dizziness, presyncope, syncope, bleeding, or neurologic sequela. The patient is tolerating medications without difficulties.  Main symptoms are weakness, fatigue, shortness of breath.  He is not able to do many of his daily activities due to his level of weakness and fatigue.  He has been working with physical therapy, but due to lower extremity edema and increased weight from that, and fatigue from his likely atrial fibrillation, he has not been able to work with him.   Past Medical History:  Diagnosis Date   Arthritis    "just about all over" (02/05/2018)   AV block, Mobitz 2 02/03/2018   PPM placed 01/2018   BPH (benign prostatic hyperplasia)    Chronic combined systolic and diastolic CHF (congestive heart failure) ([facility])    Complication of anesthesia    "had hard time waking up a long time ago" (02/05/2018)   Cyst of kidney, acquired    "recently found; ? side" (02/05/2018)   GERD (gastroesophageal reflux disease)    History of hiatal hernia    Hypertension    Mild CAD    a. 30-40% LAD in 2019.   Neuropathy    NICM (nonischemic cardiomyopathy) (HCC)    Persistent atrial fibrillation (HCC)    Pleural  effusion    dx 04/01/12   Skin cancer    "face, nose" (02/05/2018)   Syncope 02/03/2018   Past Surgical History:  Procedure Laterality Date   APPENDECTOMY     BACK SURGERY     CARDIAC CATHETERIZATION  02/05/2018   CATARACT EXTRACTION, BILATERAL Bilateral    CHOLECYSTECTOMY     HIP ARTHROPLASTY Left 01/19/2019   Procedure: ARTHROPLASTY BIPOLAR HIP (HEMIARTHROPLASTY);  Surgeon: Altamese Redland, MD;  Location: [facility];  Service: Orthopedics;  Laterality: Left;   JOINT REPLACEMENT     LEFT HEART CATH AND CORONARY ANGIOGRAPHY N/A 02/05/2018   Procedure: LEFT HEART CATH AND CORONARY ANGIOGRAPHY;  Surgeon: Belva Crome, MD;  Location: [facility];  Service: Cardiovascular;  Laterality: N/A;   LUMBAR DISC SURGERY  1980s   PACEMAKER IMPLANT N/A 02/06/2018   Procedure: PACEMAKER IMPLANT;  Surgeon: Constance Haw, MD;  Location: [facility];  Service: Cardiovascular;  Laterality: N/A;   SKIN CANCER EXCISION     "face, nose" (02/05/2018)   TONSILLECTOMY     TOTAL HIP ARTHROPLASTY Right 1990s/2000s   ULTRASOUND GUIDANCE FOR VASCULAR ACCESS  02/05/2018   Procedure: Ultrasound Guidance For Vascular Access;  Surgeon: Belva Crome, MD;  Location: [facility];  Service: Cardiovascular;;     Current Outpatient Medications  Medication Sig Dispense Refill   amiodarone (PACERONE) 200 MG tablet Take 1 tablet (200 mg total) by mouth daily. 90 tablet 3  apixaban (ELIQUIS) 2.5 MG TABS tablet Take 1 tablet (2.5 mg total) by mouth 2 (two) times daily. 60 tablet    betamethasone dipropionate (DIPROLENE) 0.05 % cream Apply 1 application topically 2 (two) times daily as needed (as directed).   1   calcitonin, salmon, (MIACALCIN/FORTICAL) 200 UNIT/ACT nasal spray Place 1 spray into alternate nostrils every evening. 7 PM     carvedilol (COREG) 3.125 MG tablet Take 1 tablet (3.125 mg total) by mouth 2 (two) times daily. 180 tablet 3   cholecalciferol (VITAMIN D) 25 MCG  tablet Take 2 tablets (2,000 Units total) by mouth 2 (two) times daily.     Cyanocobalamin (VITAMIN B-12 PO) Take 1 tablet by mouth daily.     fluticasone (FLONASE) 50 MCG/ACT nasal spray Place 1 spray into both nostrils daily as needed for allergies.      furosemide (LASIX) 20 MG tablet Take as directed 1 tablet tues, wed, fri-Sunday 2 tablets on mon & thursdays May have 1 extra tablet daily as needed for swelling (Patient taking differently: Take 20-40 mg by mouth See admin instructions. Take 20 mg by mouth in the morning every day and a second dose of 20 mg on only Mondays and Thursdays- may take an additional 20 mg once a day as needed for swelling) 45 tablet 11   ketoconazole (NIZORAL) 2 % cream Apply 1 application topically See admin instructions. Apply as directed daily     omeprazole (PRILOSEC) 20 MG capsule Take 20 mg by mouth daily before breakfast.      Probiotic Product (ALIGN PO) Take 1 capsule by mouth daily.     tamsulosin (FLOMAX) 0.4 MG CAPS capsule Take 1 capsule (0.4 mg total) by mouth daily after supper. 30 capsule    No current facility-administered medications for this visit.     Allergies:   Aspirin, Whiskey [alcohol], Amoxicillin, Avelox [moxifloxacin hcl in nacl], Lisinopril, Sulfa antibiotics, Tape, Zithromax [azithromycin], and Penicillins   Social History:  The patient  reports that he has quit smoking. He has never used smokeless tobacco. He reports previous alcohol use. He reports that he does not use drugs.   Family History:  The patient's family history includes Heart disease in his father.    ROS:  Please see the history of present illness.   Otherwise, review of systems is positive for none.   All other systems are reviewed and negative.   PHYSICAL EXAM: VS:  BP (!) 106/50    Pulse 60    Ht 5\' 9"  (1.753 m)    Wt 161 lb 6.4 oz (73.2 kg)    SpO2 97%    BMI 23.83 kg/m  , BMI Body mass index is 23.83 kg/m. GEN: Well nourished, well developed, in no acute  distress  HEENT: normal  Neck: no JVD, carotid bruits, or masses Cardiac: RRR; no murmurs, rubs, or gallops, 2+ edema  Respiratory:  clear to auscultation bilaterally, normal work of breathing GI: soft, nontender, nondistended, + BS MS: no deformity or atrophy  Skin: warm and dry, device site well healed Neuro:  Strength and sensation are intact Psych: euthymic mood, full affect  EKG:  EKG is ordered today. Personal review of the ekg ordered shows atrial fibrillation, ventricular paced  Personal review of the device interrogation today. Results in [facility]: 10/08/2018: NT-Pro BNP 3,397 01/17/2019: ALT 31; B Natriuretic Peptide 385.7 01/18/2019: TSH 0.989 01/23/2019: Magnesium 2.3 01/27/2019: Hemoglobin 10.1; Platelets 302 01/28/2019: BUN 42; Creatinine, Ser 1.51; Potassium 3.7;  Sodium 136    Lipid Panel  No results found for: CHOL, TRIG, HDL, CHOLHDL, VLDL, LDLCALC, LDLDIRECT   Wt Readings from Last 3 Encounters:  03/29/19 161 lb 6.4 oz (73.2 kg)  01/28/19 182 lb 8.7 oz (82.8 kg)  01/13/19 162 lb 1.9 oz (73.5 kg)      Other studies Reviewed: Additional studies/ records that were reviewed today include: TTE 02/04/18  Review of the above records today demonstrates:  - Left ventricle: The cavity size was normal. Wall thickness was   normal. Systolic function was mildly reduced. The estimated   ejection fraction was in the range of 45% to 50%. Basal to mid   inferior hypokinesis. Doppler parameters are consistent with   pseudonormal left ventricular relaxation (grade 2 diastolic   dysfunction). The E/e&' ratio is >15, suggesting elevated LV   filling pressure. - Left atrium: The atrium was normal in size. - Right ventricle: The cavity size was mildly dilated. - Right atrium: Severely dilated. - Tricuspid valve: Could not measure TR jet.  LHC 02/06/18  Right dominant coronary anatomy  Ectatic left main without obstruction.  Luminal irregularities are noted  LAD  contains ectasia proximally and in the mid to distal vessel there is eccentric segmental 30 to 40% narrowing  The circumflex artery gives origin to 3 marginals with the second marginal being the largest.  Luminal irregularities are noted  The RCA is widely patent with luminal irregularities.  Left ventricular end-diastolic pressure is normal at 15 mmHg.  Ventriculography was not performed because of mild chronic kidney disease.  Total contrast, 40 cc   ASSESSMENT AND PLAN:  1.  2-1 AV block: Status post Saint Jude dual-chamber pacemaker.  Device functioning appropriately.  No changes.  2.  Hypertension: Currently well controlled  3.  Chronic diastolic heart failure: No signs of volume overload  4.  Persistent atrial fibrillation: Currently on Eliquis and amiodarone.  Unfortunately he has been in atrial fibrillation and is quite symptomatic.  He has lower extremity edema, weakness, and fatigue.  We Gailyn Crook plan for cardioversion.  We Zaydenn Balaguer also plan to increase his Lasix dose for the next 5 days.  This patients CHA2DS2-VASc Score and unadjusted Ischemic Stroke Rate (% per year) is equal to 3.2 % stroke rate/year from a score of 3  Above score calculated as 1 point each if present [CHF, HTN, DM, Vascular=MI/PAD/Aortic Plaque, Age if 65-74, or Male] Above score calculated as 2 points each if present [Age > 75, or Stroke/TIA/TE].   Current medicines are reviewed at length with the patient today.   The patient does not have concerns regarding his medicines.  The following changes were made today: None  Labs/ tests ordered today include:  Orders Placed This Encounter  Procedures   Basic metabolic panel   CBC     Disposition:   FU with Maesyn Frisinger 3 months  Signed, Claud Gowan [name], MD  03/29/2019 3:06 PM     [facility] [address] [place] [address] [phone] (office) [phone] (fax)

## 2019-03-30 DIAGNOSIS — I130 Hypertensive heart and chronic kidney disease with heart failure and stage 1 through stage 4 chronic kidney disease, or unspecified chronic kidney disease: Secondary | ICD-10-CM | POA: Diagnosis not present

## 2019-03-30 DIAGNOSIS — Z466 Encounter for fitting and adjustment of urinary device: Secondary | ICD-10-CM | POA: Diagnosis not present

## 2019-03-30 DIAGNOSIS — R338 Other retention of urine: Secondary | ICD-10-CM | POA: Diagnosis not present

## 2019-03-30 DIAGNOSIS — N183 Chronic kidney disease, stage 3 unspecified: Secondary | ICD-10-CM | POA: Diagnosis not present

## 2019-03-30 DIAGNOSIS — I5042 Chronic combined systolic (congestive) and diastolic (congestive) heart failure: Secondary | ICD-10-CM | POA: Diagnosis not present

## 2019-03-30 DIAGNOSIS — G8929 Other chronic pain: Secondary | ICD-10-CM | POA: Diagnosis not present

## 2019-03-30 DIAGNOSIS — N400 Enlarged prostate without lower urinary tract symptoms: Secondary | ICD-10-CM | POA: Diagnosis not present

## 2019-03-30 DIAGNOSIS — I2510 Atherosclerotic heart disease of native coronary artery without angina pectoris: Secondary | ICD-10-CM | POA: Diagnosis not present

## 2019-03-30 DIAGNOSIS — S72012D Unspecified intracapsular fracture of left femur, subsequent encounter for closed fracture with routine healing: Secondary | ICD-10-CM | POA: Diagnosis not present

## 2019-03-30 LAB — CBC
Hematocrit: 32.8 % — ABNORMAL LOW (ref 37.5–51.0)
Hemoglobin: 11.6 g/dL — ABNORMAL LOW (ref 13.0–17.7)
MCH: 32.9 pg (ref 26.6–33.0)
MCHC: 35.4 g/dL (ref 31.5–35.7)
MCV: 93 fL (ref 79–97)
Platelets: 326 10*3/uL (ref 150–450)
RBC: 3.53 x10E6/uL — ABNORMAL LOW (ref 4.14–5.80)
RDW: 13.6 % (ref 11.6–15.4)
WBC: 6.7 10*3/uL (ref 3.4–10.8)

## 2019-03-30 LAB — BASIC METABOLIC PANEL
BUN/Creatinine Ratio: 15 (ref 10–24)
BUN: 25 mg/dL (ref 10–36)
CO2: 25 mmol/L (ref 20–29)
Calcium: 8.5 mg/dL — ABNORMAL LOW (ref 8.6–10.2)
Chloride: 100 mmol/L (ref 96–106)
Creatinine, Ser: 1.62 mg/dL — ABNORMAL HIGH (ref 0.76–1.27)
GFR calc Af Amer: 43 mL/min/{1.73_m2} — ABNORMAL LOW (ref >59)
GFR calc non Af Amer: 37 mL/min/{1.73_m2} — ABNORMAL LOW (ref >59)
Glucose: 112 mg/dL — ABNORMAL HIGH (ref 65–99)
Potassium: 4.1 mmol/L (ref 3.5–5.2)
Sodium: 139 mmol/L (ref 134–144)

## 2019-03-31 DIAGNOSIS — N183 Chronic kidney disease, stage 3 unspecified: Secondary | ICD-10-CM | POA: Diagnosis not present

## 2019-03-31 DIAGNOSIS — R338 Other retention of urine: Secondary | ICD-10-CM | POA: Diagnosis not present

## 2019-03-31 DIAGNOSIS — N400 Enlarged prostate without lower urinary tract symptoms: Secondary | ICD-10-CM | POA: Diagnosis not present

## 2019-03-31 DIAGNOSIS — I2510 Atherosclerotic heart disease of native coronary artery without angina pectoris: Secondary | ICD-10-CM | POA: Diagnosis not present

## 2019-03-31 DIAGNOSIS — S72012D Unspecified intracapsular fracture of left femur, subsequent encounter for closed fracture with routine healing: Secondary | ICD-10-CM | POA: Diagnosis not present

## 2019-03-31 DIAGNOSIS — I130 Hypertensive heart and chronic kidney disease with heart failure and stage 1 through stage 4 chronic kidney disease, or unspecified chronic kidney disease: Secondary | ICD-10-CM | POA: Diagnosis not present

## 2019-03-31 DIAGNOSIS — I5042 Chronic combined systolic (congestive) and diastolic (congestive) heart failure: Secondary | ICD-10-CM | POA: Diagnosis not present

## 2019-03-31 DIAGNOSIS — G8929 Other chronic pain: Secondary | ICD-10-CM | POA: Diagnosis not present

## 2019-03-31 DIAGNOSIS — Z466 Encounter for fitting and adjustment of urinary device: Secondary | ICD-10-CM | POA: Diagnosis not present

## 2019-03-31 NOTE — Addendum Note (Signed)
Addended by: Gar Ponto on: 03/31/2019 06:05 AM   Modules accepted: Orders

## 2019-04-01 ENCOUNTER — Telehealth: Payer: Self-pay | Admitting: Cardiology

## 2019-04-01 DIAGNOSIS — I2510 Atherosclerotic heart disease of native coronary artery without angina pectoris: Secondary | ICD-10-CM | POA: Diagnosis not present

## 2019-04-01 DIAGNOSIS — N183 Chronic kidney disease, stage 3 unspecified: Secondary | ICD-10-CM | POA: Diagnosis not present

## 2019-04-01 DIAGNOSIS — R338 Other retention of urine: Secondary | ICD-10-CM | POA: Diagnosis not present

## 2019-04-01 DIAGNOSIS — G8929 Other chronic pain: Secondary | ICD-10-CM | POA: Diagnosis not present

## 2019-04-01 DIAGNOSIS — Z466 Encounter for fitting and adjustment of urinary device: Secondary | ICD-10-CM | POA: Diagnosis not present

## 2019-04-01 DIAGNOSIS — I130 Hypertensive heart and chronic kidney disease with heart failure and stage 1 through stage 4 chronic kidney disease, or unspecified chronic kidney disease: Secondary | ICD-10-CM | POA: Diagnosis not present

## 2019-04-01 DIAGNOSIS — I5042 Chronic combined systolic (congestive) and diastolic (congestive) heart failure: Secondary | ICD-10-CM | POA: Diagnosis not present

## 2019-04-01 DIAGNOSIS — S72012D Unspecified intracapsular fracture of left femur, subsequent encounter for closed fracture with routine healing: Secondary | ICD-10-CM | POA: Diagnosis not present

## 2019-04-01 DIAGNOSIS — N400 Enlarged prostate without lower urinary tract symptoms: Secondary | ICD-10-CM | POA: Diagnosis not present

## 2019-04-01 NOTE — Telephone Encounter (Signed)
Reviewed procedure instructions w/ son, POA 32mo f/u w/ Dr. Curt Bears scheduled. Patient verbalized understanding and agreeable to plan.

## 2019-04-01 NOTE — Telephone Encounter (Signed)
New Message:  [name], son of the patient wants to know how long his Dad will be in the hospital following his Cardioversion on Monday. Please advise

## 2019-04-02 ENCOUNTER — Other Ambulatory Visit (HOSPITAL_COMMUNITY)
Admission: RE | Admit: 2019-04-02 | Discharge: 2019-04-02 | Disposition: A | Discharge status: Home / Self Care | Payer: Medicare Other | Source: Ambulatory Visit | Attending: Cardiovascular Disease | Admitting: Cardiovascular Disease

## 2019-04-02 DIAGNOSIS — Z01812 Encounter for preprocedural laboratory examination: Secondary | ICD-10-CM | POA: Insufficient documentation

## 2019-04-02 DIAGNOSIS — Z20828 Contact with and (suspected) exposure to other viral communicable diseases: Secondary | ICD-10-CM | POA: Insufficient documentation

## 2019-04-02 LAB — SARS CORONAVIRUS 2 (TAT 6-24 HRS): SARS Coronavirus 2: NEGATIVE

## 2019-04-05 ENCOUNTER — Ambulatory Visit (HOSPITAL_COMMUNITY): Payer: Medicare Other | Admitting: Certified Registered Nurse Anesthetist

## 2019-04-05 ENCOUNTER — Encounter (HOSPITAL_COMMUNITY): Payer: Self-pay | Admitting: Cardiovascular Disease

## 2019-04-05 ENCOUNTER — Other Ambulatory Visit: Payer: Self-pay

## 2019-04-05 ENCOUNTER — Ambulatory Visit (HOSPITAL_COMMUNITY)
Admission: RE | Admit: 2019-04-05 | Discharge: 2019-04-05 | Disposition: A | Discharge status: Home / Self Care | Payer: Medicare Other | Attending: Cardiovascular Disease | Admitting: Cardiovascular Disease

## 2019-04-05 ENCOUNTER — Encounter (HOSPITAL_COMMUNITY)
Admission: RE | Disposition: A | Discharge status: Home / Self Care | Payer: Self-pay | Source: Home / Self Care | Attending: Cardiovascular Disease

## 2019-04-05 DIAGNOSIS — I2510 Atherosclerotic heart disease of native coronary artery without angina pectoris: Secondary | ICD-10-CM | POA: Diagnosis not present

## 2019-04-05 DIAGNOSIS — N189 Chronic kidney disease, unspecified: Secondary | ICD-10-CM | POA: Diagnosis not present

## 2019-04-05 DIAGNOSIS — I110 Hypertensive heart disease with heart failure: Secondary | ICD-10-CM | POA: Insufficient documentation

## 2019-04-05 DIAGNOSIS — M1990 Unspecified osteoarthritis, unspecified site: Secondary | ICD-10-CM | POA: Insufficient documentation

## 2019-04-05 DIAGNOSIS — E1140 Type 2 diabetes mellitus with diabetic neuropathy, unspecified: Secondary | ICD-10-CM | POA: Diagnosis not present

## 2019-04-05 DIAGNOSIS — Z886 Allergy status to analgesic agent status: Secondary | ICD-10-CM | POA: Insufficient documentation

## 2019-04-05 DIAGNOSIS — Z87891 Personal history of nicotine dependence: Secondary | ICD-10-CM | POA: Diagnosis not present

## 2019-04-05 DIAGNOSIS — Z881 Allergy status to other antibiotic agents status: Secondary | ICD-10-CM | POA: Insufficient documentation

## 2019-04-05 DIAGNOSIS — I5043 Acute on chronic combined systolic (congestive) and diastolic (congestive) heart failure: Secondary | ICD-10-CM | POA: Diagnosis not present

## 2019-04-05 DIAGNOSIS — Z880 Allergy status to penicillin: Secondary | ICD-10-CM | POA: Diagnosis not present

## 2019-04-05 DIAGNOSIS — I5042 Chronic combined systolic (congestive) and diastolic (congestive) heart failure: Secondary | ICD-10-CM | POA: Diagnosis not present

## 2019-04-05 DIAGNOSIS — Z466 Encounter for fitting and adjustment of urinary device: Secondary | ICD-10-CM | POA: Diagnosis not present

## 2019-04-05 DIAGNOSIS — J449 Chronic obstructive pulmonary disease, unspecified: Secondary | ICD-10-CM | POA: Diagnosis not present

## 2019-04-05 DIAGNOSIS — I4819 Other persistent atrial fibrillation: Secondary | ICD-10-CM | POA: Diagnosis not present

## 2019-04-05 DIAGNOSIS — S72012D Unspecified intracapsular fracture of left femur, subsequent encounter for closed fracture with routine healing: Secondary | ICD-10-CM | POA: Diagnosis not present

## 2019-04-05 DIAGNOSIS — Z7901 Long term (current) use of anticoagulants: Secondary | ICD-10-CM | POA: Insufficient documentation

## 2019-04-05 DIAGNOSIS — Z79899 Other long term (current) drug therapy: Secondary | ICD-10-CM | POA: Diagnosis not present

## 2019-04-05 DIAGNOSIS — I441 Atrioventricular block, second degree: Secondary | ICD-10-CM | POA: Insufficient documentation

## 2019-04-05 DIAGNOSIS — R338 Other retention of urine: Secondary | ICD-10-CM | POA: Diagnosis not present

## 2019-04-05 DIAGNOSIS — K219 Gastro-esophageal reflux disease without esophagitis: Secondary | ICD-10-CM | POA: Diagnosis not present

## 2019-04-05 DIAGNOSIS — G8929 Other chronic pain: Secondary | ICD-10-CM | POA: Diagnosis not present

## 2019-04-05 DIAGNOSIS — Z882 Allergy status to sulfonamides status: Secondary | ICD-10-CM | POA: Diagnosis not present

## 2019-04-05 DIAGNOSIS — I10 Essential (primary) hypertension: Secondary | ICD-10-CM | POA: Diagnosis not present

## 2019-04-05 DIAGNOSIS — Z888 Allergy status to other drugs, medicaments and biological substances status: Secondary | ICD-10-CM | POA: Insufficient documentation

## 2019-04-05 DIAGNOSIS — I4821 Permanent atrial fibrillation: Secondary | ICD-10-CM | POA: Diagnosis not present

## 2019-04-05 DIAGNOSIS — Z8249 Family history of ischemic heart disease and other diseases of the circulatory system: Secondary | ICD-10-CM | POA: Insufficient documentation

## 2019-04-05 DIAGNOSIS — N400 Enlarged prostate without lower urinary tract symptoms: Secondary | ICD-10-CM | POA: Diagnosis not present

## 2019-04-05 DIAGNOSIS — N183 Chronic kidney disease, stage 3 unspecified: Secondary | ICD-10-CM | POA: Diagnosis not present

## 2019-04-05 DIAGNOSIS — I130 Hypertensive heart and chronic kidney disease with heart failure and stage 1 through stage 4 chronic kidney disease, or unspecified chronic kidney disease: Secondary | ICD-10-CM | POA: Diagnosis not present

## 2019-04-05 HISTORY — PX: CARDIOVERSION: SHX1299

## 2019-04-05 SURGERY — CARDIOVERSION
Anesthesia: General

## 2019-04-05 MED ORDER — LIDOCAINE 2% (20 MG/ML) 5 ML SYRINGE
INTRAMUSCULAR | Status: DC | PRN
  Administered 2019-04-05: 60 mg via INTRAVENOUS

## 2019-04-05 MED ORDER — PROPOFOL 10 MG/ML IV BOLUS
INTRAVENOUS | Status: DC | PRN
  Administered 2019-04-05: 50 mg via INTRAVENOUS

## 2019-04-05 MED ORDER — SODIUM CHLORIDE 0.9 % IV SOLN
INTRAVENOUS | Status: DC | PRN
  Administered 2019-04-05: 08:00:00 via INTRAVENOUS
  Administered 2019-04-05: 500 mL via INTRAVENOUS

## 2019-04-05 NOTE — Anesthesia Procedure Notes (Signed)
Date/Time: 04/05/2019 8:37 AM Performed by: Janene Harvey, CRNA Pre-anesthesia Checklist: Patient identified, Emergency Drugs available, Suction available and Patient being monitored Patient Re-evaluated:Patient Re-evaluated prior to induction Oxygen Delivery Method: Ambu bag Dental Injury: Teeth and Oropharynx as per pre-operative assessment

## 2019-04-05 NOTE — CV Procedure (Signed)
    Cardioversion Note  DAWONE JACKO [id number] 02-15-1926  Procedure: DC Cardioversion Indications: Atrial fib   Procedure Details Consent: Obtained Time Out: Verified patient identification, verified procedure, site/side was marked, verified correct patient position, special equipment/implants available, Radiology Safety Procedures followed,  medications/allergies/relevent history reviewed, required imaging and test results available.  Performed  The patient has been on adequate anticoagulation.  The patient received IV Lidocaine 60 mg followed by Propofol 50 mg  for sedation.  Synchronous cardioversion was performed at 200  joules.  The cardioversion was successful     Complications: No apparent complications Patient did tolerate procedure well.   Thayer Headings, Brooke Bonito., MD, [facility] 04/05/2019, 8:43 AM

## 2019-04-05 NOTE — Discharge Instructions (Signed)

## 2019-04-05 NOTE — Interval H&P Note (Signed)
History and Physical Interval Note:  04/05/2019 7:27 AM  Frank Elliott  has presented today for surgery, with the diagnosis of ATRIAL FIBRILLATION.  The various methods of treatment have been discussed with the patient and family. After consideration of risks, benefits and other options for treatment, the patient has consented to  Procedure(s): CARDIOVERSION (N/A) as a surgical intervention.  The patient's history has been reviewed, patient examined, no change in status, stable for surgery.  I have reviewed the patient's chart and labs.  Questions were answered to the patient's satisfaction.     Mertie Moores

## 2019-04-05 NOTE — Anesthesia Preprocedure Evaluation (Addendum)
Anesthesia Evaluation  Patient identified by MRN, date of birth, ID band Patient awake    Reviewed: Allergy & Precautions, NPO status , Patient's Chart, lab work & pertinent test results, reviewed documented beta blocker date and time   Airway Mallampati: III  TM Distance: >3 FB Neck ROM: Full  Mouth opening: Limited Mouth Opening  Dental no notable dental hx. (+) Teeth Intact, Dental Advisory Given   Pulmonary COPD, former smoker,    Pulmonary exam normal breath sounds clear to auscultation       Cardiovascular hypertension, Pt. on home beta blockers and Pt. 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 The left ventricle has mildly reduced systolic function, with an ejection fraction of 45-50%. The cavity size was normal. Left ventricular diastolic function could not be evaluated secondary to atrial fibrillation. There is abnormal septal motion  consistent with RV pacemaker. Left ventrical global hypokinesis without regional wall motion abnormalities.  2. The right ventricle has mildly reduced systolic function. The cavity was moderately enlarged. There is no increase in right ventricular wall thickness. Right ventricular systolic pressure is mildly elevated with an estimated pressure of 38.7 mmHg.  3. Left atrial size was mildly dilated.  4. Right atrial size was mildly dilated.  5. Trivial pericardial effusion is present.  6. The MR jet is centrally-directed.  7. The aorta is normal unless otherwise noted.  LHC 2019 Right dominant coronary anatomy Ectatic left main without obstruction.  Luminal irregularities are noted LAD contains ectasia proximally and in the mid to distal vessel there is eccentric segmental 30 to 40% narrowing The circumflex artery gives origin to 3 marginals with the  second marginal being the largest.  Luminal irregularities are noted The RCA is widely patent with luminal irregularities. Left ventricular end-diastolic pressure is normal at 15 mmHg.  Ventriculography was not performed because of mild chronic kidney disease.   Neuro/Psych negative neurological ROS  negative psych ROS   GI/Hepatic Neg liver ROS, hiatal hernia, GERD  ,  Endo/Other  negative endocrine ROS  Renal/GU Renal InsufficiencyRenal disease  negative genitourinary   Musculoskeletal  (+) Arthritis ,   Abdominal   Peds  Hematology  (+) Blood dyscrasia (on eliquis ), ,   Anesthesia Other Findings   Reproductive/Obstetrics                            Anesthesia Physical Anesthesia Plan  ASA: III  Anesthesia Plan: General   Post-op Pain Management:    Induction: Intravenous  PONV Risk Score and Plan: Propofol infusion and Treatment may vary due to age or medical condition  Airway Management Planned: Natural Airway  Additional Equipment:   Intra-op Plan:   Post-operative Plan:   Informed Consent: I have reviewed the patients History and Physical, chart, labs and discussed the procedure including the risks, benefits and alternatives for the proposed anesthesia with the patient or authorized representative who has indicated his/her understanding and acceptance.     Dental advisory given  Plan Discussed with: CRNA  Anesthesia Plan Comments:         Anesthesia Quick Evaluation

## 2019-04-05 NOTE — Interval H&P Note (Signed)
History and Physical Interval Note:  04/05/2019 8:34 AM  Frank Elliott  has presented today for surgery, with the diagnosis of ATRIAL FIBRILLATION.  The various methods of treatment have been discussed with the patient and family. After consideration of risks, benefits and other options for treatment, the patient has consented to  Procedure(s): CARDIOVERSION (N/A) as a surgical intervention.  The patient's history has been reviewed, patient examined, no change in status, stable for surgery.  I have reviewed the patient's chart and labs.  Questions were answered to the patient's satisfaction.     Mertie Moores

## 2019-04-05 NOTE — Anesthesia Postprocedure Evaluation (Signed)
Anesthesia Post Note  Patient: Frank Elliott  Procedure(s) Performed: CARDIOVERSION (N/A )     Patient location during evaluation: Endoscopy Anesthesia Type: General Level of consciousness: awake and alert Pain management: pain level controlled Vital Signs Assessment: post-procedure vital signs reviewed and stable Respiratory status: spontaneous breathing, nonlabored ventilation, respiratory function stable and patient connected to nasal cannula oxygen Cardiovascular status: blood pressure returned to baseline and stable Postop Assessment: no apparent nausea or vomiting Anesthetic complications: no    Last Vitals:  Vitals:   04/05/19 0743 04/05/19 0850  BP: 139/65 (!) 131/56  Pulse: 62   Resp: 14 14  Temp: 36.6 C   SpO2: 100% 100%    Last Pain:  Vitals:   04/05/19 0850  TempSrc:   PainSc: 0-No pain                 Chelsey L Woodrum

## 2019-04-05 NOTE — Transfer of Care (Signed)
Immediate Anesthesia Transfer of Care Note  Patient: Frank Elliott  Procedure(s) Performed: CARDIOVERSION (N/A )  Patient Location: Endoscopy Unit  Anesthesia Type:General  Level of Consciousness: drowsy  Airway & Oxygen Therapy: Patient Spontanous Breathing and Patient connected to nasal cannula oxygen  Post-op Assessment: Report given to RN and Post -op Vital signs reviewed and stable  Post vital signs: Reviewed  Last Vitals:  Vitals Value Taken Time  BP    Temp    Pulse    Resp    SpO2      Last Pain:  Vitals:   04/05/19 0743  TempSrc: Oral  PainSc: 0-No pain         Complications: No apparent anesthesia complications

## 2019-04-06 DIAGNOSIS — N400 Enlarged prostate without lower urinary tract symptoms: Secondary | ICD-10-CM | POA: Diagnosis not present

## 2019-04-06 DIAGNOSIS — K224 Dyskinesia of esophagus: Secondary | ICD-10-CM | POA: Diagnosis not present

## 2019-04-06 DIAGNOSIS — I5042 Chronic combined systolic (congestive) and diastolic (congestive) heart failure: Secondary | ICD-10-CM | POA: Diagnosis not present

## 2019-04-06 DIAGNOSIS — S72012D Unspecified intracapsular fracture of left femur, subsequent encounter for closed fracture with routine healing: Secondary | ICD-10-CM | POA: Diagnosis not present

## 2019-04-06 DIAGNOSIS — N183 Chronic kidney disease, stage 3 unspecified: Secondary | ICD-10-CM | POA: Diagnosis not present

## 2019-04-06 DIAGNOSIS — I2510 Atherosclerotic heart disease of native coronary artery without angina pectoris: Secondary | ICD-10-CM | POA: Diagnosis not present

## 2019-04-06 DIAGNOSIS — I130 Hypertensive heart and chronic kidney disease with heart failure and stage 1 through stage 4 chronic kidney disease, or unspecified chronic kidney disease: Secondary | ICD-10-CM | POA: Diagnosis not present

## 2019-04-06 DIAGNOSIS — K449 Diaphragmatic hernia without obstruction or gangrene: Secondary | ICD-10-CM | POA: Diagnosis not present

## 2019-04-06 DIAGNOSIS — Z466 Encounter for fitting and adjustment of urinary device: Secondary | ICD-10-CM | POA: Diagnosis not present

## 2019-04-06 DIAGNOSIS — G8929 Other chronic pain: Secondary | ICD-10-CM | POA: Diagnosis not present

## 2019-04-06 DIAGNOSIS — R338 Other retention of urine: Secondary | ICD-10-CM | POA: Diagnosis not present

## 2019-04-06 DIAGNOSIS — R05 Cough: Secondary | ICD-10-CM | POA: Diagnosis not present

## 2019-04-09 DIAGNOSIS — R338 Other retention of urine: Secondary | ICD-10-CM | POA: Diagnosis not present

## 2019-04-09 DIAGNOSIS — I130 Hypertensive heart and chronic kidney disease with heart failure and stage 1 through stage 4 chronic kidney disease, or unspecified chronic kidney disease: Secondary | ICD-10-CM | POA: Diagnosis not present

## 2019-04-09 DIAGNOSIS — N400 Enlarged prostate without lower urinary tract symptoms: Secondary | ICD-10-CM | POA: Diagnosis not present

## 2019-04-09 DIAGNOSIS — I5042 Chronic combined systolic (congestive) and diastolic (congestive) heart failure: Secondary | ICD-10-CM | POA: Diagnosis not present

## 2019-04-09 DIAGNOSIS — S72012D Unspecified intracapsular fracture of left femur, subsequent encounter for closed fracture with routine healing: Secondary | ICD-10-CM | POA: Diagnosis not present

## 2019-04-09 DIAGNOSIS — G8929 Other chronic pain: Secondary | ICD-10-CM | POA: Diagnosis not present

## 2019-04-09 DIAGNOSIS — I2510 Atherosclerotic heart disease of native coronary artery without angina pectoris: Secondary | ICD-10-CM | POA: Diagnosis not present

## 2019-04-09 DIAGNOSIS — Z466 Encounter for fitting and adjustment of urinary device: Secondary | ICD-10-CM | POA: Diagnosis not present

## 2019-04-09 DIAGNOSIS — N183 Chronic kidney disease, stage 3 unspecified: Secondary | ICD-10-CM | POA: Diagnosis not present

## 2019-04-13 DIAGNOSIS — N400 Enlarged prostate without lower urinary tract symptoms: Secondary | ICD-10-CM | POA: Diagnosis not present

## 2019-04-13 DIAGNOSIS — I5042 Chronic combined systolic (congestive) and diastolic (congestive) heart failure: Secondary | ICD-10-CM | POA: Diagnosis not present

## 2019-04-13 DIAGNOSIS — I130 Hypertensive heart and chronic kidney disease with heart failure and stage 1 through stage 4 chronic kidney disease, or unspecified chronic kidney disease: Secondary | ICD-10-CM | POA: Diagnosis not present

## 2019-04-13 DIAGNOSIS — S72012D Unspecified intracapsular fracture of left femur, subsequent encounter for closed fracture with routine healing: Secondary | ICD-10-CM | POA: Diagnosis not present

## 2019-04-13 DIAGNOSIS — R338 Other retention of urine: Secondary | ICD-10-CM | POA: Diagnosis not present

## 2019-04-13 DIAGNOSIS — N183 Chronic kidney disease, stage 3 unspecified: Secondary | ICD-10-CM | POA: Diagnosis not present

## 2019-04-13 DIAGNOSIS — I2510 Atherosclerotic heart disease of native coronary artery without angina pectoris: Secondary | ICD-10-CM | POA: Diagnosis not present

## 2019-04-13 DIAGNOSIS — Z466 Encounter for fitting and adjustment of urinary device: Secondary | ICD-10-CM | POA: Diagnosis not present

## 2019-04-13 DIAGNOSIS — G8929 Other chronic pain: Secondary | ICD-10-CM | POA: Diagnosis not present

## 2019-04-14 DIAGNOSIS — N183 Chronic kidney disease, stage 3 unspecified: Secondary | ICD-10-CM | POA: Diagnosis not present

## 2019-04-14 DIAGNOSIS — R338 Other retention of urine: Secondary | ICD-10-CM | POA: Diagnosis not present

## 2019-04-14 DIAGNOSIS — G8929 Other chronic pain: Secondary | ICD-10-CM | POA: Diagnosis not present

## 2019-04-14 DIAGNOSIS — I5042 Chronic combined systolic (congestive) and diastolic (congestive) heart failure: Secondary | ICD-10-CM | POA: Diagnosis not present

## 2019-04-14 DIAGNOSIS — N400 Enlarged prostate without lower urinary tract symptoms: Secondary | ICD-10-CM | POA: Diagnosis not present

## 2019-04-14 DIAGNOSIS — Z466 Encounter for fitting and adjustment of urinary device: Secondary | ICD-10-CM | POA: Diagnosis not present

## 2019-04-14 DIAGNOSIS — I130 Hypertensive heart and chronic kidney disease with heart failure and stage 1 through stage 4 chronic kidney disease, or unspecified chronic kidney disease: Secondary | ICD-10-CM | POA: Diagnosis not present

## 2019-04-14 DIAGNOSIS — I2510 Atherosclerotic heart disease of native coronary artery without angina pectoris: Secondary | ICD-10-CM | POA: Diagnosis not present

## 2019-04-14 DIAGNOSIS — S72012D Unspecified intracapsular fracture of left femur, subsequent encounter for closed fracture with routine healing: Secondary | ICD-10-CM | POA: Diagnosis not present

## 2019-04-15 DIAGNOSIS — N400 Enlarged prostate without lower urinary tract symptoms: Secondary | ICD-10-CM | POA: Diagnosis not present

## 2019-04-15 DIAGNOSIS — S72012D Unspecified intracapsular fracture of left femur, subsequent encounter for closed fracture with routine healing: Secondary | ICD-10-CM | POA: Diagnosis not present

## 2019-04-15 DIAGNOSIS — R3914 Feeling of incomplete bladder emptying: Secondary | ICD-10-CM | POA: Diagnosis not present

## 2019-04-15 DIAGNOSIS — R350 Frequency of micturition: Secondary | ICD-10-CM | POA: Diagnosis not present

## 2019-04-15 DIAGNOSIS — R300 Dysuria: Secondary | ICD-10-CM | POA: Diagnosis not present

## 2019-04-15 DIAGNOSIS — I130 Hypertensive heart and chronic kidney disease with heart failure and stage 1 through stage 4 chronic kidney disease, or unspecified chronic kidney disease: Secondary | ICD-10-CM | POA: Diagnosis not present

## 2019-04-15 DIAGNOSIS — R338 Other retention of urine: Secondary | ICD-10-CM | POA: Diagnosis not present

## 2019-04-15 DIAGNOSIS — Z466 Encounter for fitting and adjustment of urinary device: Secondary | ICD-10-CM | POA: Diagnosis not present

## 2019-04-15 DIAGNOSIS — I5042 Chronic combined systolic (congestive) and diastolic (congestive) heart failure: Secondary | ICD-10-CM | POA: Diagnosis not present

## 2019-04-15 DIAGNOSIS — I2510 Atherosclerotic heart disease of native coronary artery without angina pectoris: Secondary | ICD-10-CM | POA: Diagnosis not present

## 2019-04-15 DIAGNOSIS — G8929 Other chronic pain: Secondary | ICD-10-CM | POA: Diagnosis not present

## 2019-04-15 DIAGNOSIS — N401 Enlarged prostate with lower urinary tract symptoms: Secondary | ICD-10-CM | POA: Diagnosis not present

## 2019-04-15 DIAGNOSIS — R3912 Poor urinary stream: Secondary | ICD-10-CM | POA: Diagnosis not present

## 2019-04-15 DIAGNOSIS — R351 Nocturia: Secondary | ICD-10-CM | POA: Diagnosis not present

## 2019-04-15 DIAGNOSIS — N183 Chronic kidney disease, stage 3 unspecified: Secondary | ICD-10-CM | POA: Diagnosis not present

## 2019-04-16 DIAGNOSIS — N400 Enlarged prostate without lower urinary tract symptoms: Secondary | ICD-10-CM | POA: Diagnosis not present

## 2019-04-16 DIAGNOSIS — S72012D Unspecified intracapsular fracture of left femur, subsequent encounter for closed fracture with routine healing: Secondary | ICD-10-CM | POA: Diagnosis not present

## 2019-04-16 DIAGNOSIS — I5042 Chronic combined systolic (congestive) and diastolic (congestive) heart failure: Secondary | ICD-10-CM | POA: Diagnosis not present

## 2019-04-16 DIAGNOSIS — I2510 Atherosclerotic heart disease of native coronary artery without angina pectoris: Secondary | ICD-10-CM | POA: Diagnosis not present

## 2019-04-16 DIAGNOSIS — N183 Chronic kidney disease, stage 3 unspecified: Secondary | ICD-10-CM | POA: Diagnosis not present

## 2019-04-16 DIAGNOSIS — I130 Hypertensive heart and chronic kidney disease with heart failure and stage 1 through stage 4 chronic kidney disease, or unspecified chronic kidney disease: Secondary | ICD-10-CM | POA: Diagnosis not present

## 2019-04-16 DIAGNOSIS — G8929 Other chronic pain: Secondary | ICD-10-CM | POA: Diagnosis not present

## 2019-04-16 DIAGNOSIS — R338 Other retention of urine: Secondary | ICD-10-CM | POA: Diagnosis not present

## 2019-04-16 DIAGNOSIS — Z466 Encounter for fitting and adjustment of urinary device: Secondary | ICD-10-CM | POA: Diagnosis not present

## 2019-04-19 ENCOUNTER — Other Ambulatory Visit (HOSPITAL_COMMUNITY): Payer: Self-pay | Admitting: Urology

## 2019-04-19 DIAGNOSIS — G8929 Other chronic pain: Secondary | ICD-10-CM | POA: Diagnosis not present

## 2019-04-19 DIAGNOSIS — Z466 Encounter for fitting and adjustment of urinary device: Secondary | ICD-10-CM | POA: Diagnosis not present

## 2019-04-19 DIAGNOSIS — I130 Hypertensive heart and chronic kidney disease with heart failure and stage 1 through stage 4 chronic kidney disease, or unspecified chronic kidney disease: Secondary | ICD-10-CM | POA: Diagnosis not present

## 2019-04-19 DIAGNOSIS — I2510 Atherosclerotic heart disease of native coronary artery without angina pectoris: Secondary | ICD-10-CM | POA: Diagnosis not present

## 2019-04-19 DIAGNOSIS — N183 Chronic kidney disease, stage 3 unspecified: Secondary | ICD-10-CM | POA: Diagnosis not present

## 2019-04-19 DIAGNOSIS — R338 Other retention of urine: Secondary | ICD-10-CM | POA: Diagnosis not present

## 2019-04-19 DIAGNOSIS — S72012D Unspecified intracapsular fracture of left femur, subsequent encounter for closed fracture with routine healing: Secondary | ICD-10-CM | POA: Diagnosis not present

## 2019-04-19 DIAGNOSIS — I5042 Chronic combined systolic (congestive) and diastolic (congestive) heart failure: Secondary | ICD-10-CM | POA: Diagnosis not present

## 2019-04-19 DIAGNOSIS — N400 Enlarged prostate without lower urinary tract symptoms: Secondary | ICD-10-CM | POA: Diagnosis not present

## 2019-04-19 DIAGNOSIS — N390 Urinary tract infection, site not specified: Secondary | ICD-10-CM

## 2019-04-20 ENCOUNTER — Other Ambulatory Visit: Payer: Self-pay

## 2019-04-20 ENCOUNTER — Ambulatory Visit (HOSPITAL_COMMUNITY)
Admission: RE | Admit: 2019-04-20 | Discharge: 2019-04-20 | Disposition: A | Discharge status: Home / Self Care | Payer: Medicare Other | Source: Ambulatory Visit | Attending: Urology | Admitting: Urology

## 2019-04-20 ENCOUNTER — Other Ambulatory Visit (HOSPITAL_COMMUNITY): Payer: Self-pay | Admitting: Urology

## 2019-04-20 DIAGNOSIS — I2510 Atherosclerotic heart disease of native coronary artery without angina pectoris: Secondary | ICD-10-CM | POA: Diagnosis not present

## 2019-04-20 DIAGNOSIS — I5042 Chronic combined systolic (congestive) and diastolic (congestive) heart failure: Secondary | ICD-10-CM | POA: Diagnosis not present

## 2019-04-20 DIAGNOSIS — Z466 Encounter for fitting and adjustment of urinary device: Secondary | ICD-10-CM | POA: Diagnosis not present

## 2019-04-20 DIAGNOSIS — R338 Other retention of urine: Secondary | ICD-10-CM | POA: Diagnosis not present

## 2019-04-20 DIAGNOSIS — N390 Urinary tract infection, site not specified: Secondary | ICD-10-CM | POA: Diagnosis not present

## 2019-04-20 DIAGNOSIS — N183 Chronic kidney disease, stage 3 unspecified: Secondary | ICD-10-CM | POA: Diagnosis not present

## 2019-04-20 DIAGNOSIS — Z452 Encounter for adjustment and management of vascular access device: Secondary | ICD-10-CM | POA: Diagnosis not present

## 2019-04-20 DIAGNOSIS — G8929 Other chronic pain: Secondary | ICD-10-CM | POA: Diagnosis not present

## 2019-04-20 DIAGNOSIS — I130 Hypertensive heart and chronic kidney disease with heart failure and stage 1 through stage 4 chronic kidney disease, or unspecified chronic kidney disease: Secondary | ICD-10-CM | POA: Diagnosis not present

## 2019-04-20 DIAGNOSIS — S72012D Unspecified intracapsular fracture of left femur, subsequent encounter for closed fracture with routine healing: Secondary | ICD-10-CM | POA: Diagnosis not present

## 2019-04-20 DIAGNOSIS — N400 Enlarged prostate without lower urinary tract symptoms: Secondary | ICD-10-CM | POA: Diagnosis not present

## 2019-04-20 LAB — VITAMIN D 25 HYDROXY (VIT D DEFICIENCY, FRACTURES): Vit D, 25-Hydroxy: 44.15 ng/mL (ref 30–100)

## 2019-04-20 MED ORDER — HEPARIN SOD (PORK) LOCK FLUSH 100 UNIT/ML IV SOLN
INTRAVENOUS | Status: AC
  Filled 2019-04-20: qty 5

## 2019-04-20 MED ORDER — LIDOCAINE HCL 1 % IJ SOLN
INTRAMUSCULAR | Status: AC
  Filled 2019-04-20: qty 20

## 2019-04-20 MED ORDER — LIDOCAINE HCL 1 % IJ SOLN
INTRAMUSCULAR | Status: DC | PRN
  Administered 2019-04-20: 5 mL

## 2019-04-20 NOTE — Procedures (Signed)
Interventional Radiology Procedure:   Indications: UTI  Procedure: PICC line placement  Findings: Right basilic PICC, length 41 cm.   Tip at [place].  Failed on left arm due to pacemaker.  Complications: None     EBL: less than 20 ml  Plan: PICC line is ready to use.   Hillarie Harrigan R. Anselm Pancoast, MD  Pager: [phone]

## 2019-04-21 DIAGNOSIS — G8929 Other chronic pain: Secondary | ICD-10-CM | POA: Diagnosis not present

## 2019-04-21 DIAGNOSIS — R338 Other retention of urine: Secondary | ICD-10-CM | POA: Diagnosis not present

## 2019-04-21 DIAGNOSIS — Z466 Encounter for fitting and adjustment of urinary device: Secondary | ICD-10-CM | POA: Diagnosis not present

## 2019-04-21 DIAGNOSIS — S72012D Unspecified intracapsular fracture of left femur, subsequent encounter for closed fracture with routine healing: Secondary | ICD-10-CM | POA: Diagnosis not present

## 2019-04-21 DIAGNOSIS — I130 Hypertensive heart and chronic kidney disease with heart failure and stage 1 through stage 4 chronic kidney disease, or unspecified chronic kidney disease: Secondary | ICD-10-CM | POA: Diagnosis not present

## 2019-04-21 DIAGNOSIS — I5042 Chronic combined systolic (congestive) and diastolic (congestive) heart failure: Secondary | ICD-10-CM | POA: Diagnosis not present

## 2019-04-21 DIAGNOSIS — N400 Enlarged prostate without lower urinary tract symptoms: Secondary | ICD-10-CM | POA: Diagnosis not present

## 2019-04-21 DIAGNOSIS — I2510 Atherosclerotic heart disease of native coronary artery without angina pectoris: Secondary | ICD-10-CM | POA: Diagnosis not present

## 2019-04-21 DIAGNOSIS — N183 Chronic kidney disease, stage 3 unspecified: Secondary | ICD-10-CM | POA: Diagnosis not present

## 2019-04-22 DIAGNOSIS — R338 Other retention of urine: Secondary | ICD-10-CM | POA: Diagnosis not present

## 2019-04-22 DIAGNOSIS — S72012D Unspecified intracapsular fracture of left femur, subsequent encounter for closed fracture with routine healing: Secondary | ICD-10-CM | POA: Diagnosis not present

## 2019-04-22 DIAGNOSIS — I130 Hypertensive heart and chronic kidney disease with heart failure and stage 1 through stage 4 chronic kidney disease, or unspecified chronic kidney disease: Secondary | ICD-10-CM | POA: Diagnosis not present

## 2019-04-22 DIAGNOSIS — G8929 Other chronic pain: Secondary | ICD-10-CM | POA: Diagnosis not present

## 2019-04-22 DIAGNOSIS — N183 Chronic kidney disease, stage 3 unspecified: Secondary | ICD-10-CM | POA: Diagnosis not present

## 2019-04-22 DIAGNOSIS — I5042 Chronic combined systolic (congestive) and diastolic (congestive) heart failure: Secondary | ICD-10-CM | POA: Diagnosis not present

## 2019-04-22 DIAGNOSIS — Z466 Encounter for fitting and adjustment of urinary device: Secondary | ICD-10-CM | POA: Diagnosis not present

## 2019-04-22 DIAGNOSIS — N400 Enlarged prostate without lower urinary tract symptoms: Secondary | ICD-10-CM | POA: Diagnosis not present

## 2019-04-22 DIAGNOSIS — I2510 Atherosclerotic heart disease of native coronary artery without angina pectoris: Secondary | ICD-10-CM | POA: Diagnosis not present

## 2019-04-24 DIAGNOSIS — L89322 Pressure ulcer of left buttock, stage 2: Secondary | ICD-10-CM | POA: Diagnosis not present

## 2019-04-24 DIAGNOSIS — S41111D Laceration without foreign body of right upper arm, subsequent encounter: Secondary | ICD-10-CM | POA: Diagnosis not present

## 2019-04-24 DIAGNOSIS — I5042 Chronic combined systolic (congestive) and diastolic (congestive) heart failure: Secondary | ICD-10-CM | POA: Diagnosis not present

## 2019-04-24 DIAGNOSIS — N183 Chronic kidney disease, stage 3 unspecified: Secondary | ICD-10-CM | POA: Diagnosis not present

## 2019-04-24 DIAGNOSIS — Z96642 Presence of left artificial hip joint: Secondary | ICD-10-CM | POA: Diagnosis not present

## 2019-04-24 DIAGNOSIS — Z452 Encounter for adjustment and management of vascular access device: Secondary | ICD-10-CM | POA: Diagnosis not present

## 2019-04-24 DIAGNOSIS — I130 Hypertensive heart and chronic kidney disease with heart failure and stage 1 through stage 4 chronic kidney disease, or unspecified chronic kidney disease: Secondary | ICD-10-CM | POA: Diagnosis not present

## 2019-04-24 DIAGNOSIS — Z792 Long term (current) use of antibiotics: Secondary | ICD-10-CM | POA: Diagnosis not present

## 2019-04-24 DIAGNOSIS — I2510 Atherosclerotic heart disease of native coronary artery without angina pectoris: Secondary | ICD-10-CM | POA: Diagnosis not present

## 2019-04-24 DIAGNOSIS — K219 Gastro-esophageal reflux disease without esophagitis: Secondary | ICD-10-CM | POA: Diagnosis not present

## 2019-04-24 DIAGNOSIS — S72012D Unspecified intracapsular fracture of left femur, subsequent encounter for closed fracture with routine healing: Secondary | ICD-10-CM | POA: Diagnosis not present

## 2019-04-24 DIAGNOSIS — N401 Enlarged prostate with lower urinary tract symptoms: Secondary | ICD-10-CM | POA: Diagnosis not present

## 2019-04-24 DIAGNOSIS — Z950 Presence of cardiac pacemaker: Secondary | ICD-10-CM | POA: Diagnosis not present

## 2019-04-24 DIAGNOSIS — R338 Other retention of urine: Secondary | ICD-10-CM | POA: Diagnosis not present

## 2019-04-24 DIAGNOSIS — I4819 Other persistent atrial fibrillation: Secondary | ICD-10-CM | POA: Diagnosis not present

## 2019-04-24 DIAGNOSIS — G629 Polyneuropathy, unspecified: Secondary | ICD-10-CM | POA: Diagnosis not present

## 2019-04-24 DIAGNOSIS — W19XXXD Unspecified fall, subsequent encounter: Secondary | ICD-10-CM | POA: Diagnosis not present

## 2019-04-24 DIAGNOSIS — N390 Urinary tract infection, site not specified: Secondary | ICD-10-CM | POA: Diagnosis not present

## 2019-04-26 DIAGNOSIS — S72012D Unspecified intracapsular fracture of left femur, subsequent encounter for closed fracture with routine healing: Secondary | ICD-10-CM | POA: Diagnosis not present

## 2019-04-26 DIAGNOSIS — I2510 Atherosclerotic heart disease of native coronary artery without angina pectoris: Secondary | ICD-10-CM | POA: Diagnosis not present

## 2019-04-26 DIAGNOSIS — N401 Enlarged prostate with lower urinary tract symptoms: Secondary | ICD-10-CM | POA: Diagnosis not present

## 2019-04-26 DIAGNOSIS — N390 Urinary tract infection, site not specified: Secondary | ICD-10-CM | POA: Diagnosis not present

## 2019-04-26 DIAGNOSIS — S41111D Laceration without foreign body of right upper arm, subsequent encounter: Secondary | ICD-10-CM | POA: Diagnosis not present

## 2019-04-26 DIAGNOSIS — I130 Hypertensive heart and chronic kidney disease with heart failure and stage 1 through stage 4 chronic kidney disease, or unspecified chronic kidney disease: Secondary | ICD-10-CM | POA: Diagnosis not present

## 2019-04-26 DIAGNOSIS — R338 Other retention of urine: Secondary | ICD-10-CM | POA: Diagnosis not present

## 2019-04-26 DIAGNOSIS — L89322 Pressure ulcer of left buttock, stage 2: Secondary | ICD-10-CM | POA: Diagnosis not present

## 2019-04-26 DIAGNOSIS — Z452 Encounter for adjustment and management of vascular access device: Secondary | ICD-10-CM | POA: Diagnosis not present

## 2019-04-27 DIAGNOSIS — S41111D Laceration without foreign body of right upper arm, subsequent encounter: Secondary | ICD-10-CM | POA: Diagnosis not present

## 2019-04-27 DIAGNOSIS — I2510 Atherosclerotic heart disease of native coronary artery without angina pectoris: Secondary | ICD-10-CM | POA: Diagnosis not present

## 2019-04-27 DIAGNOSIS — N401 Enlarged prostate with lower urinary tract symptoms: Secondary | ICD-10-CM | POA: Diagnosis not present

## 2019-04-27 DIAGNOSIS — Z452 Encounter for adjustment and management of vascular access device: Secondary | ICD-10-CM | POA: Diagnosis not present

## 2019-04-27 DIAGNOSIS — I130 Hypertensive heart and chronic kidney disease with heart failure and stage 1 through stage 4 chronic kidney disease, or unspecified chronic kidney disease: Secondary | ICD-10-CM | POA: Diagnosis not present

## 2019-04-27 DIAGNOSIS — N390 Urinary tract infection, site not specified: Secondary | ICD-10-CM | POA: Diagnosis not present

## 2019-04-27 DIAGNOSIS — R338 Other retention of urine: Secondary | ICD-10-CM | POA: Diagnosis not present

## 2019-04-27 DIAGNOSIS — L89322 Pressure ulcer of left buttock, stage 2: Secondary | ICD-10-CM | POA: Diagnosis not present

## 2019-04-27 DIAGNOSIS — S72012D Unspecified intracapsular fracture of left femur, subsequent encounter for closed fracture with routine healing: Secondary | ICD-10-CM | POA: Diagnosis not present

## 2019-04-28 DIAGNOSIS — I130 Hypertensive heart and chronic kidney disease with heart failure and stage 1 through stage 4 chronic kidney disease, or unspecified chronic kidney disease: Secondary | ICD-10-CM | POA: Diagnosis not present

## 2019-04-28 DIAGNOSIS — N390 Urinary tract infection, site not specified: Secondary | ICD-10-CM | POA: Diagnosis not present

## 2019-04-28 DIAGNOSIS — S41111D Laceration without foreign body of right upper arm, subsequent encounter: Secondary | ICD-10-CM | POA: Diagnosis not present

## 2019-04-28 DIAGNOSIS — Z452 Encounter for adjustment and management of vascular access device: Secondary | ICD-10-CM | POA: Diagnosis not present

## 2019-04-28 DIAGNOSIS — R338 Other retention of urine: Secondary | ICD-10-CM | POA: Diagnosis not present

## 2019-04-28 DIAGNOSIS — I2510 Atherosclerotic heart disease of native coronary artery without angina pectoris: Secondary | ICD-10-CM | POA: Diagnosis not present

## 2019-04-28 DIAGNOSIS — L89322 Pressure ulcer of left buttock, stage 2: Secondary | ICD-10-CM | POA: Diagnosis not present

## 2019-04-28 DIAGNOSIS — N401 Enlarged prostate with lower urinary tract symptoms: Secondary | ICD-10-CM | POA: Diagnosis not present

## 2019-04-28 DIAGNOSIS — S72012D Unspecified intracapsular fracture of left femur, subsequent encounter for closed fracture with routine healing: Secondary | ICD-10-CM | POA: Diagnosis not present

## 2019-04-29 ENCOUNTER — Telehealth: Payer: Self-pay | Admitting: Cardiology

## 2019-04-29 DIAGNOSIS — I130 Hypertensive heart and chronic kidney disease with heart failure and stage 1 through stage 4 chronic kidney disease, or unspecified chronic kidney disease: Secondary | ICD-10-CM | POA: Diagnosis not present

## 2019-04-29 DIAGNOSIS — I2510 Atherosclerotic heart disease of native coronary artery without angina pectoris: Secondary | ICD-10-CM | POA: Diagnosis not present

## 2019-04-29 DIAGNOSIS — S72012D Unspecified intracapsular fracture of left femur, subsequent encounter for closed fracture with routine healing: Secondary | ICD-10-CM | POA: Diagnosis not present

## 2019-04-29 DIAGNOSIS — N401 Enlarged prostate with lower urinary tract symptoms: Secondary | ICD-10-CM | POA: Diagnosis not present

## 2019-04-29 DIAGNOSIS — L89322 Pressure ulcer of left buttock, stage 2: Secondary | ICD-10-CM | POA: Diagnosis not present

## 2019-04-29 DIAGNOSIS — S41111D Laceration without foreign body of right upper arm, subsequent encounter: Secondary | ICD-10-CM | POA: Diagnosis not present

## 2019-04-29 DIAGNOSIS — N390 Urinary tract infection, site not specified: Secondary | ICD-10-CM | POA: Diagnosis not present

## 2019-04-29 DIAGNOSIS — S72002D Fracture of unspecified part of neck of left femur, subsequent encounter for closed fracture with routine healing: Secondary | ICD-10-CM | POA: Diagnosis not present

## 2019-04-29 DIAGNOSIS — Z452 Encounter for adjustment and management of vascular access device: Secondary | ICD-10-CM | POA: Diagnosis not present

## 2019-04-29 DIAGNOSIS — R338 Other retention of urine: Secondary | ICD-10-CM | POA: Diagnosis not present

## 2019-04-29 NOTE — Telephone Encounter (Signed)
Patient with diagnosis of afib on Eliquis for anticoagulation.    Procedure: [facility] -Office Procedure  Date of procedure: 05/24/2019  CHADS2-VASc score of  5 (CHF, HTN, AGE,  CAD, AGE)  CrCl 30 ml/min  Per office protocol, patient can hold Eliquis for 1 day prior to procedure.

## 2019-04-29 NOTE — Telephone Encounter (Signed)
   [facility] Pre-operative Risk Assessment    Request for surgical clearance:  1. What type of surgery is being performed? Revuzum -Office Procedure   2. When is this surgery scheduled? 05-24-19   3. What type of clearance is required (medical clearance vs. Pharmacy clearance to hold med vs. Both)?  Both  4. Are there any medications that need to be held prior to surgery and how long Eliquis   5. Practice name and name of physician performing surgery? Dr Leida Lauth   6. What is your office phone number* [phone]    7.   What is your office fax number [phone]  8.   Anesthesia type (None, local, MAC, general) ? Nitrous  Frank Elliott 04/29/2019, 4:28 PM  _________________________________________________________________   (provider comments below)

## 2019-04-29 NOTE — Telephone Encounter (Signed)
   Primary Cardiologist: Sinclair Grooms, MD  Chart reviewed as part of pre-operative protocol coverage. Given past medical history and time since last visit, based on ACC/AHA guidelines, Aric J Viverito would be at acceptable risk for the planned procedure without further cardiovascular testing.   Patient underwent direct current cardioversion 04/05/2019 and will ned to remain on uninterrupted anticoagulation at least until 05/05/2019. Procedure appears to be scheduled on 05/24/2019.   Patient with diagnosis of afib on Eliquis for anticoagulation.    Procedure: [facility] -Office Procedure Date of procedure: 05/24/2019  CHADS2-VASc score of  5 (CHF, HTN, AGE,  CAD, AGE)  CrCl 30 ml/min  Per office protocol, patient can hold Eliquis for 1 day prior to procedure.    I will route this recommendation to the requesting party via Epic fax function and remove from pre-op pool.  Please call with questions.  Kathyrn Drown, NP 04/29/2019, 5:12 PM

## 2019-05-03 DIAGNOSIS — Z743 Need for continuous supervision: Secondary | ICD-10-CM | POA: Diagnosis not present

## 2019-05-03 DIAGNOSIS — E870 Hyperosmolality and hypernatremia: Secondary | ICD-10-CM | POA: Diagnosis not present

## 2019-05-03 DIAGNOSIS — E871 Hypo-osmolality and hyponatremia: Secondary | ICD-10-CM | POA: Diagnosis not present

## 2019-05-03 DIAGNOSIS — R404 Transient alteration of awareness: Secondary | ICD-10-CM | POA: Diagnosis not present

## 2019-05-03 DIAGNOSIS — I130 Hypertensive heart and chronic kidney disease with heart failure and stage 1 through stage 4 chronic kidney disease, or unspecified chronic kidney disease: Secondary | ICD-10-CM | POA: Diagnosis not present

## 2019-05-03 DIAGNOSIS — M6281 Muscle weakness (generalized): Secondary | ICD-10-CM | POA: Diagnosis not present

## 2019-05-03 DIAGNOSIS — I5042 Chronic combined systolic (congestive) and diastolic (congestive) heart failure: Secondary | ICD-10-CM | POA: Diagnosis not present

## 2019-05-03 DIAGNOSIS — J449 Chronic obstructive pulmonary disease, unspecified: Secondary | ICD-10-CM | POA: Diagnosis not present

## 2019-05-03 DIAGNOSIS — F0150 Vascular dementia without behavioral disturbance: Secondary | ICD-10-CM | POA: Diagnosis not present

## 2019-05-03 DIAGNOSIS — R1311 Dysphagia, oral phase: Secondary | ICD-10-CM | POA: Diagnosis not present

## 2019-05-03 DIAGNOSIS — R443 Hallucinations, unspecified: Secondary | ICD-10-CM | POA: Diagnosis not present

## 2019-05-03 DIAGNOSIS — Z950 Presence of cardiac pacemaker: Secondary | ICD-10-CM | POA: Diagnosis not present

## 2019-05-03 DIAGNOSIS — R440 Auditory hallucinations: Secondary | ICD-10-CM | POA: Diagnosis not present

## 2019-05-03 DIAGNOSIS — T68XXXA Hypothermia, initial encounter: Secondary | ICD-10-CM | POA: Diagnosis not present

## 2019-05-03 DIAGNOSIS — N183 Chronic kidney disease, stage 3 unspecified: Secondary | ICD-10-CM | POA: Diagnosis not present

## 2019-05-03 DIAGNOSIS — I447 Left bundle-branch block, unspecified: Secondary | ICD-10-CM | POA: Diagnosis not present

## 2019-05-03 DIAGNOSIS — J189 Pneumonia, unspecified organism: Secondary | ICD-10-CM | POA: Diagnosis not present

## 2019-05-03 DIAGNOSIS — R1310 Dysphagia, unspecified: Secondary | ICD-10-CM | POA: Diagnosis not present

## 2019-05-03 DIAGNOSIS — F05 Delirium due to known physiological condition: Secondary | ICD-10-CM | POA: Diagnosis not present

## 2019-05-03 DIAGNOSIS — R2681 Unsteadiness on feet: Secondary | ICD-10-CM | POA: Diagnosis not present

## 2019-05-03 DIAGNOSIS — R4182 Altered mental status, unspecified: Secondary | ICD-10-CM | POA: Diagnosis not present

## 2019-05-03 DIAGNOSIS — G9340 Encephalopathy, unspecified: Secondary | ICD-10-CM | POA: Diagnosis not present

## 2019-05-03 DIAGNOSIS — G9341 Metabolic encephalopathy: Secondary | ICD-10-CM | POA: Diagnosis not present

## 2019-05-03 DIAGNOSIS — R918 Other nonspecific abnormal finding of lung field: Secondary | ICD-10-CM | POA: Diagnosis not present

## 2019-05-03 DIAGNOSIS — Z66 Do not resuscitate: Secondary | ICD-10-CM | POA: Diagnosis not present

## 2019-05-03 DIAGNOSIS — I4821 Permanent atrial fibrillation: Secondary | ICD-10-CM | POA: Diagnosis not present

## 2019-05-04 DIAGNOSIS — R443 Hallucinations, unspecified: Secondary | ICD-10-CM | POA: Diagnosis not present

## 2019-05-04 NOTE — Telephone Encounter (Signed)
lmtcb

## 2019-05-05 DIAGNOSIS — Z950 Presence of cardiac pacemaker: Secondary | ICD-10-CM | POA: Diagnosis not present

## 2019-05-05 DIAGNOSIS — R443 Hallucinations, unspecified: Secondary | ICD-10-CM | POA: Diagnosis not present

## 2019-05-05 DIAGNOSIS — I447 Left bundle-branch block, unspecified: Secondary | ICD-10-CM | POA: Diagnosis not present

## 2019-05-17 DIAGNOSIS — G9341 Metabolic encephalopathy: Secondary | ICD-10-CM | POA: Diagnosis not present

## 2019-05-17 DIAGNOSIS — R1310 Dysphagia, unspecified: Secondary | ICD-10-CM | POA: Diagnosis not present

## 2019-05-17 DIAGNOSIS — Z66 Do not resuscitate: Secondary | ICD-10-CM | POA: Diagnosis not present

## 2019-05-17 DIAGNOSIS — J449 Chronic obstructive pulmonary disease, unspecified: Secondary | ICD-10-CM | POA: Diagnosis not present

## 2019-05-17 DIAGNOSIS — R443 Hallucinations, unspecified: Secondary | ICD-10-CM | POA: Diagnosis not present

## 2019-05-18 DIAGNOSIS — R1310 Dysphagia, unspecified: Secondary | ICD-10-CM | POA: Diagnosis not present

## 2019-05-18 DIAGNOSIS — Z66 Do not resuscitate: Secondary | ICD-10-CM | POA: Diagnosis not present

## 2019-05-18 DIAGNOSIS — G9341 Metabolic encephalopathy: Secondary | ICD-10-CM | POA: Diagnosis not present

## 2019-05-18 DIAGNOSIS — J449 Chronic obstructive pulmonary disease, unspecified: Secondary | ICD-10-CM | POA: Diagnosis not present

## 2019-05-18 DIAGNOSIS — R443 Hallucinations, unspecified: Secondary | ICD-10-CM | POA: Diagnosis not present

## 2019-05-19 ENCOUNTER — Telehealth: Payer: Self-pay | Admitting: Cardiology

## 2019-05-19 DIAGNOSIS — G9340 Encephalopathy, unspecified: Secondary | ICD-10-CM | POA: Diagnosis not present

## 2019-05-19 DIAGNOSIS — R1310 Dysphagia, unspecified: Secondary | ICD-10-CM | POA: Diagnosis not present

## 2019-05-19 DIAGNOSIS — R443 Hallucinations, unspecified: Secondary | ICD-10-CM | POA: Diagnosis not present

## 2019-05-19 DIAGNOSIS — J449 Chronic obstructive pulmonary disease, unspecified: Secondary | ICD-10-CM | POA: Diagnosis not present

## 2019-05-19 NOTE — Telephone Encounter (Signed)
New Message     Pts son is calling and says the pt is not doing well. He is at a medical center and he thinks tomorrow he will be transferred to a nursing facility.  He says the patient may pass in 1 week- a month. He is wondering what he needs to do with the patients device    Please call

## 2019-05-19 NOTE — Telephone Encounter (Signed)
Patient being transferred to facility per son [name], who is listed on DPR. Asked recommendations concerning remote monitor and if it should be taken to facility. Informed familiy that remote transmitter could be sent to facility and we would be able to monitor transmissions. Patient has stopped eating and is suffering from vascular dementia and family reports he may not have more than a few weeks to live. [name] reported patient will be unable to attend appointment in February. I assured family Iwill notify Dr Curt Bears of patient's condition. Son asked if pacemaker could be deactivated and son informed that the patient's pacemaker will not be deactivated and  that it did not have the capabilities to shock the heart or prolong life if the heart stopped.

## 2019-05-19 NOTE — Telephone Encounter (Signed)
LMOM to call clinic and device clinic # provided.

## 2019-05-20 DIAGNOSIS — E43 Unspecified severe protein-calorie malnutrition: Secondary | ICD-10-CM | POA: Diagnosis not present

## 2019-05-20 DIAGNOSIS — S72002D Fracture of unspecified part of neck of left femur, subsequent encounter for closed fracture with routine healing: Secondary | ICD-10-CM | POA: Diagnosis not present

## 2019-05-20 DIAGNOSIS — R1311 Dysphagia, oral phase: Secondary | ICD-10-CM | POA: Diagnosis not present

## 2019-05-20 DIAGNOSIS — G9341 Metabolic encephalopathy: Secondary | ICD-10-CM | POA: Diagnosis not present

## 2019-05-20 DIAGNOSIS — R443 Hallucinations, unspecified: Secondary | ICD-10-CM | POA: Diagnosis not present

## 2019-05-20 DIAGNOSIS — R2681 Unsteadiness on feet: Secondary | ICD-10-CM | POA: Diagnosis not present

## 2019-05-20 DIAGNOSIS — Z23 Encounter for immunization: Secondary | ICD-10-CM | POA: Diagnosis not present

## 2019-05-20 DIAGNOSIS — Z743 Need for continuous supervision: Secondary | ICD-10-CM | POA: Diagnosis not present

## 2019-05-20 DIAGNOSIS — G9340 Encephalopathy, unspecified: Secondary | ICD-10-CM | POA: Diagnosis not present

## 2019-05-20 DIAGNOSIS — J449 Chronic obstructive pulmonary disease, unspecified: Secondary | ICD-10-CM | POA: Diagnosis not present

## 2019-05-20 DIAGNOSIS — Z03818 Encounter for observation for suspected exposure to other biological agents ruled out: Secondary | ICD-10-CM | POA: Diagnosis not present

## 2019-05-20 DIAGNOSIS — R404 Transient alteration of awareness: Secondary | ICD-10-CM | POA: Diagnosis not present

## 2019-05-20 DIAGNOSIS — Z20828 Contact with and (suspected) exposure to other viral communicable diseases: Secondary | ICD-10-CM | POA: Diagnosis not present

## 2019-05-20 DIAGNOSIS — I5042 Chronic combined systolic (congestive) and diastolic (congestive) heart failure: Secondary | ICD-10-CM | POA: Diagnosis not present

## 2019-05-20 DIAGNOSIS — N183 Chronic kidney disease, stage 3 unspecified: Secondary | ICD-10-CM | POA: Diagnosis not present

## 2019-05-20 DIAGNOSIS — M6281 Muscle weakness (generalized): Secondary | ICD-10-CM | POA: Diagnosis not present

## 2019-05-20 DIAGNOSIS — R4702 Dysphasia: Secondary | ICD-10-CM | POA: Diagnosis not present

## 2019-05-20 DIAGNOSIS — I509 Heart failure, unspecified: Secondary | ICD-10-CM | POA: Diagnosis not present

## 2019-05-21 DIAGNOSIS — S72002D Fracture of unspecified part of neck of left femur, subsequent encounter for closed fracture with routine healing: Secondary | ICD-10-CM | POA: Diagnosis not present

## 2019-05-21 DIAGNOSIS — E43 Unspecified severe protein-calorie malnutrition: Secondary | ICD-10-CM | POA: Diagnosis not present

## 2019-05-21 DIAGNOSIS — I509 Heart failure, unspecified: Secondary | ICD-10-CM | POA: Diagnosis not present

## 2019-05-21 DIAGNOSIS — J449 Chronic obstructive pulmonary disease, unspecified: Secondary | ICD-10-CM | POA: Diagnosis not present

## 2019-05-21 DIAGNOSIS — R4702 Dysphasia: Secondary | ICD-10-CM | POA: Diagnosis not present

## 2019-05-29 ENCOUNTER — Other Ambulatory Visit: Payer: Self-pay | Admitting: Interventional Cardiology

## 2019-06-04 ENCOUNTER — Other Ambulatory Visit: Payer: Self-pay | Admitting: *Deleted

## 2019-06-04 NOTE — Patient Outreach (Signed)
Late entry for 06/03/19  Screened for potential [facility] Care [facility] needs as a benefit of  [employer].  Frank Elliott is currently receiving skilled therapy at [facility].   Writer attended telephonic interdisciplinary team meeting to assess for disposition needs and transition plan for resident.   Facility staff reports member will stay at facility for long term care with palliative follow up. Per facility, family previously declined hospice services.   Will continue to follow while receiving skilled care at SNF.   Marthenia Rolling, MSN-Ed, RN,BSN [place] Acute Care Coordinator [phone] [facility]) [phone]  (Toll free office)

## 2019-06-14 DIAGNOSIS — Z20828 Contact with and (suspected) exposure to other viral communicable diseases: Secondary | ICD-10-CM | POA: Diagnosis not present

## 2019-06-16 DIAGNOSIS — I4891 Unspecified atrial fibrillation: Secondary | ICD-10-CM | POA: Diagnosis not present

## 2019-06-16 DIAGNOSIS — E43 Unspecified severe protein-calorie malnutrition: Secondary | ICD-10-CM | POA: Diagnosis not present

## 2019-06-16 DIAGNOSIS — Z515 Encounter for palliative care: Secondary | ICD-10-CM | POA: Diagnosis not present

## 2019-06-16 DIAGNOSIS — N1831 Chronic kidney disease, stage 3a: Secondary | ICD-10-CM | POA: Diagnosis not present

## 2019-06-18 DIAGNOSIS — R634 Abnormal weight loss: Secondary | ICD-10-CM | POA: Diagnosis not present

## 2019-06-18 DIAGNOSIS — R4182 Altered mental status, unspecified: Secondary | ICD-10-CM | POA: Diagnosis not present

## 2019-06-18 DIAGNOSIS — I129 Hypertensive chronic kidney disease with stage 1 through stage 4 chronic kidney disease, or unspecified chronic kidney disease: Secondary | ICD-10-CM | POA: Diagnosis not present

## 2019-06-18 DIAGNOSIS — R1310 Dysphagia, unspecified: Secondary | ICD-10-CM | POA: Diagnosis not present

## 2019-06-18 DIAGNOSIS — I442 Atrioventricular block, complete: Secondary | ICD-10-CM | POA: Diagnosis not present

## 2019-06-18 DIAGNOSIS — J449 Chronic obstructive pulmonary disease, unspecified: Secondary | ICD-10-CM | POA: Diagnosis not present

## 2019-06-18 DIAGNOSIS — F0150 Vascular dementia without behavioral disturbance: Secondary | ICD-10-CM | POA: Diagnosis not present

## 2019-06-18 DIAGNOSIS — I679 Cerebrovascular disease, unspecified: Secondary | ICD-10-CM | POA: Diagnosis not present

## 2019-06-18 DIAGNOSIS — N183 Chronic kidney disease, stage 3 unspecified: Secondary | ICD-10-CM | POA: Diagnosis not present

## 2019-06-18 DIAGNOSIS — Z8781 Personal history of (healed) traumatic fracture: Secondary | ICD-10-CM | POA: Diagnosis not present

## 2019-06-18 DIAGNOSIS — Z87891 Personal history of nicotine dependence: Secondary | ICD-10-CM | POA: Diagnosis not present

## 2019-06-21 ENCOUNTER — Ambulatory Visit: Payer: Medicare Other | Admitting: Cardiology

## 2019-06-21 NOTE — Progress Notes (Addendum)
Patient deceased. Note deleted as this visit did not occur. This encounter was created in error - please disregard.

## 2019-06-30 ENCOUNTER — Telehealth: Payer: Self-pay

## 2019-06-30 LAB — CUP PACEART REMOTE DEVICE CHECK
Battery Remaining Longevity: 70 mo
Battery Remaining Percentage: 95.5 %
Battery Voltage: 2.96 V
Brady Statistic AP VP Percent: 14 %
Brady Statistic AP VS Percent: 1 %
Brady Statistic AS VP Percent: 78 %
Brady Statistic AS VS Percent: 3.6 %
Brady Statistic RA Percent Paced: 10 %
Brady Statistic RV Percent Paced: 92 %
Date Time Interrogation Session: 20210205120903
Implantable Lead Implant Date: 20190927
Implantable Lead Implant Date: 20190927
Implantable Lead Location: 753859
Implantable Lead Location: 753860
Implantable Pulse Generator Implant Date: 20190927
Lead Channel Impedance Value: 450 Ohm
Lead Channel Impedance Value: 460 Ohm
Lead Channel Pacing Threshold Amplitude: 0.5 V
Lead Channel Pacing Threshold Amplitude: 0.625 V
Lead Channel Pacing Threshold Pulse Width: 0.5 ms
Lead Channel Pacing Threshold Pulse Width: 0.5 ms
Lead Channel Sensing Intrinsic Amplitude: 1.6 mV
Lead Channel Sensing Intrinsic Amplitude: 7.6 mV
Lead Channel Setting Pacing Amplitude: 2 V
Lead Channel Setting Pacing Amplitude: 5 V
Lead Channel Setting Pacing Pulse Width: 0.5 ms
Lead Channel Setting Sensing Sensitivity: 2 mV
Pulse Gen Model: 2272
Pulse Gen Serial Number: 9066149

## 2019-06-30 NOTE — Telephone Encounter (Signed)
The pt son [name] let me know that the pt passed away on June 26, 2019. I apologized for his lost and cancelled all the upcoming home remote appointments. I also let him know [facility] do not take their monitors back so he can discard the pt monitor.

## 2019-07-06 NOTE — Addendum Note (Signed)
Addended by: Allegra Lai M on: 07/06/2019 12:07 PM   Modules accepted: Level of Service, SmartSet

## 2019-07-12 DEATH — deceased
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
## 2020-05-30 IMAGING — CT CT HIP*L* W/O CM
3 of 5 series · 15 of 36 positions shown, 17 images · non-contrast
Comparison: X-ray 12/31/2018

CLINICAL DATA: Left hip pain

EXAM:
CT OF THE LEFT HIP WITHOUT CONTRAST
TECHNIQUE: Multidetector CT imaging of the left hip was performed according to
the standard protocol. Multiplanar CT image reconstructions were
also generated.

[Series 3: soft tissue · axial · 0.47mm/px · z∈[-937,-717]mm · 8 of 53 slices shown, 10 images]
[im 5/53  soft-tissue]
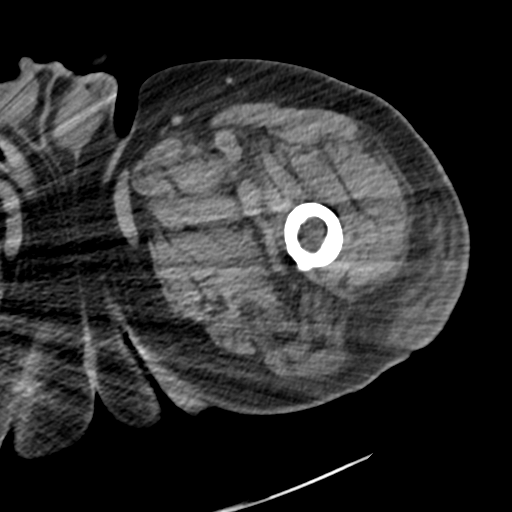
[im 5/53  bone]
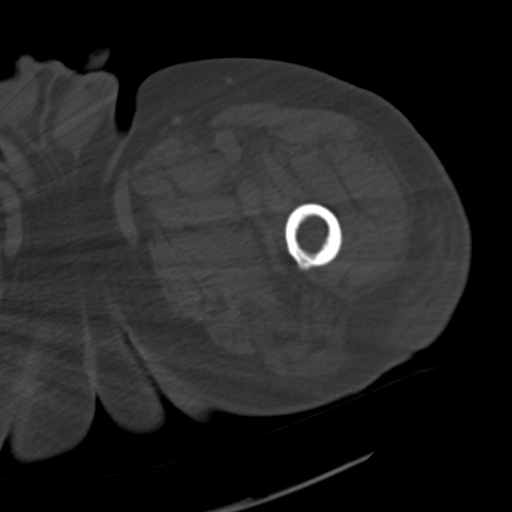
[im 13/53  bone]
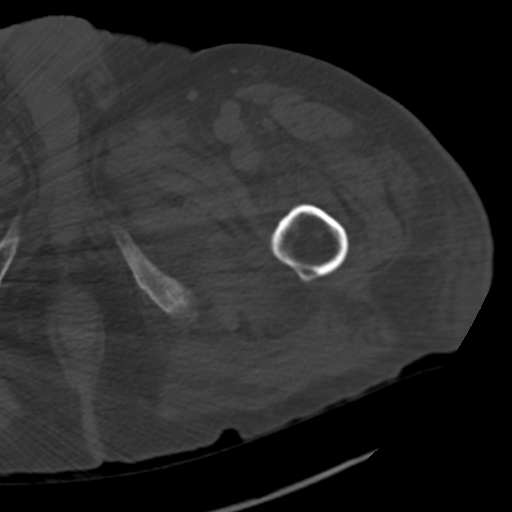
[im 17/53  bone]
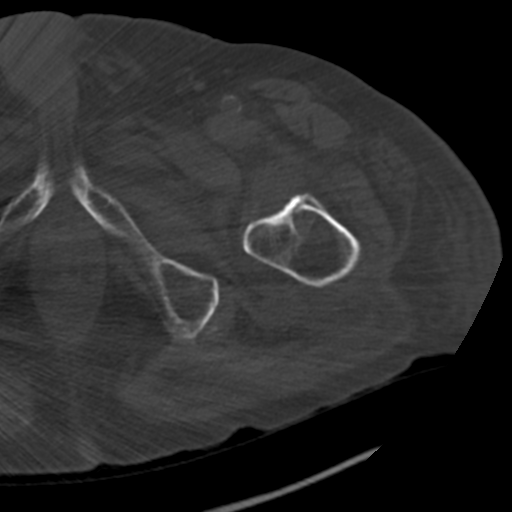
[im 25/53  bone]
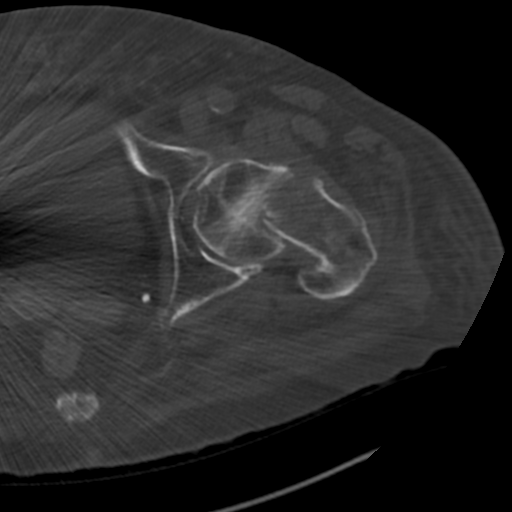
[im 29/53  soft-tissue]
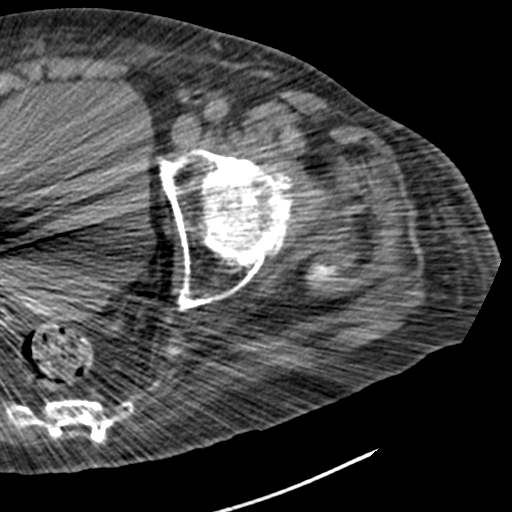
[im 29/53  bone]
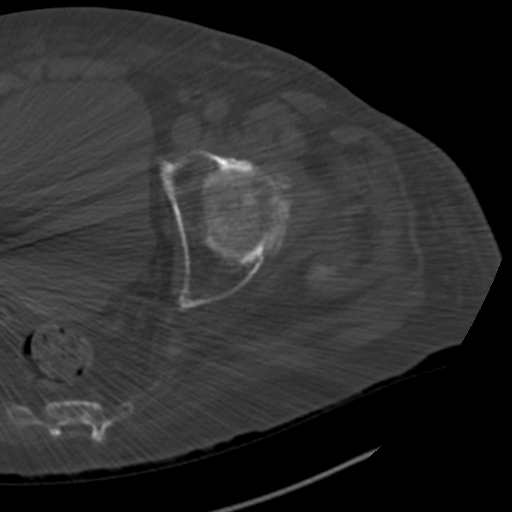
[im 37/53  bone]
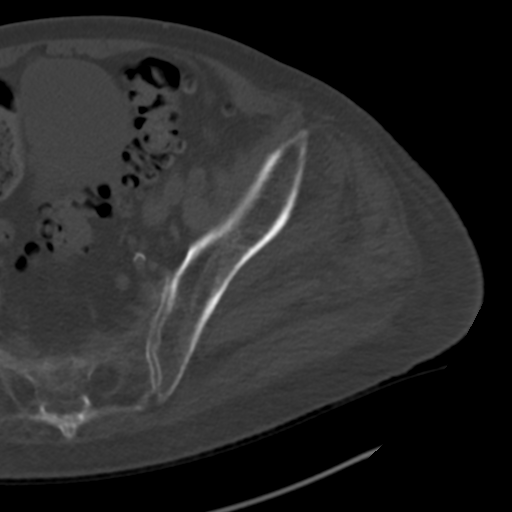
[im 41/53  bone]
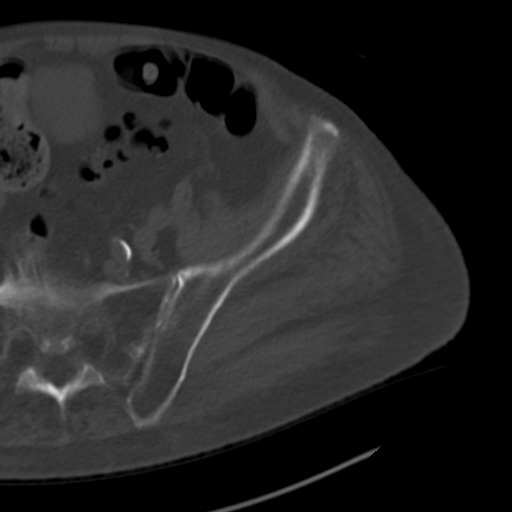
[im 49/53  bone]
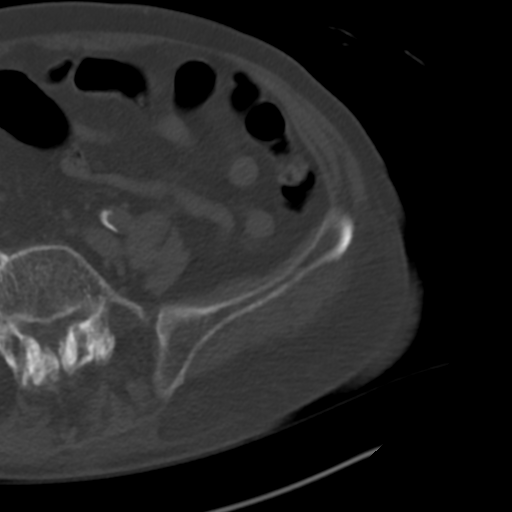

[Series 5: cor st · coronal · 0.42mm/px · 1 of 115 slices shown]
[im 58/115  bone]
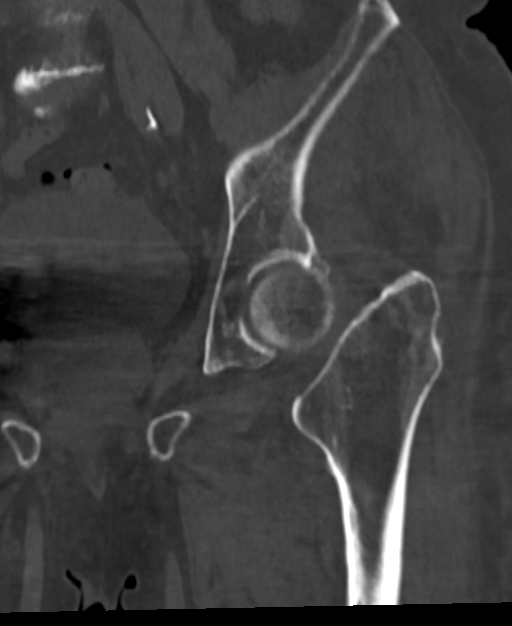

[Series 6: sag st · sagittal · 0.39mm/px · 6 of 102 slices shown]
[im 17/102  bone]
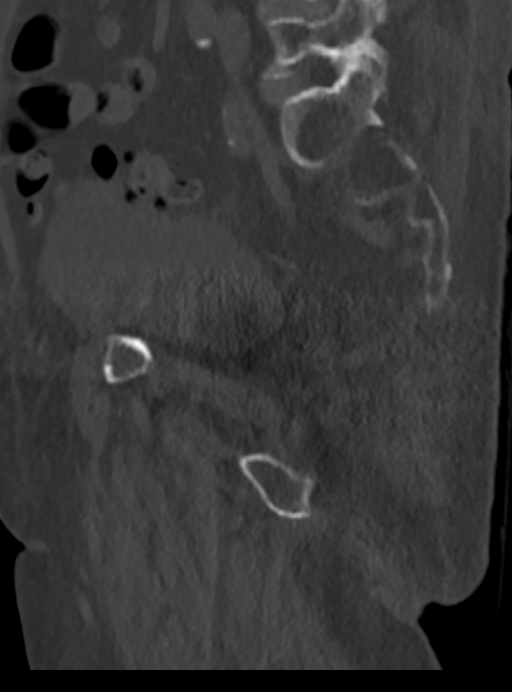
[im 34/102  bone]
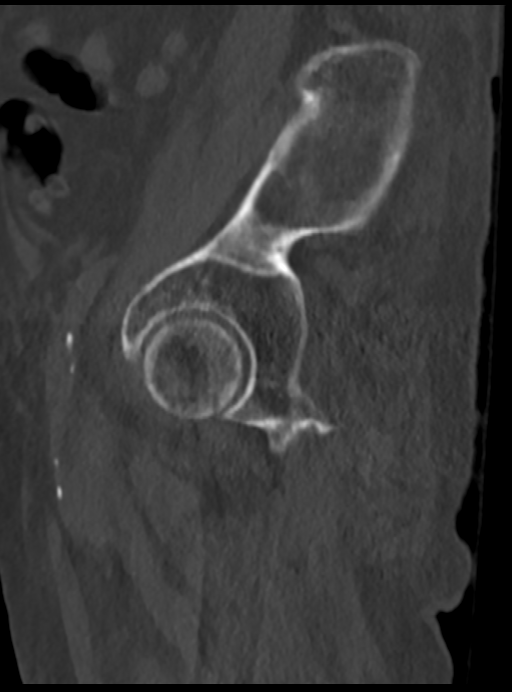
[im 36/102  soft-tissue]
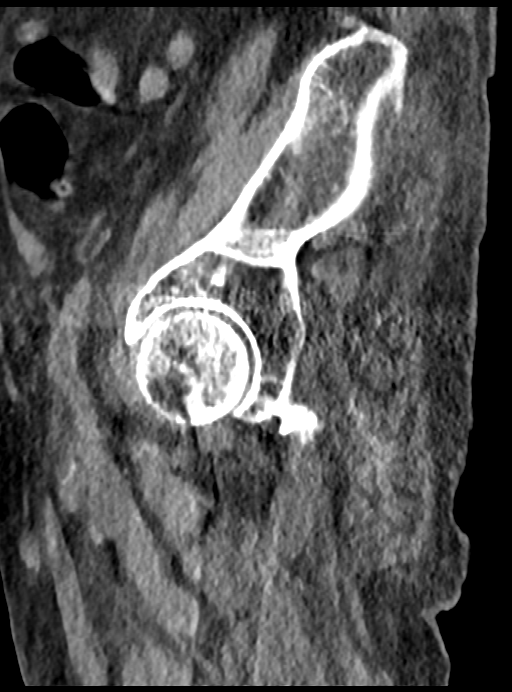
[im 51/102  bone]
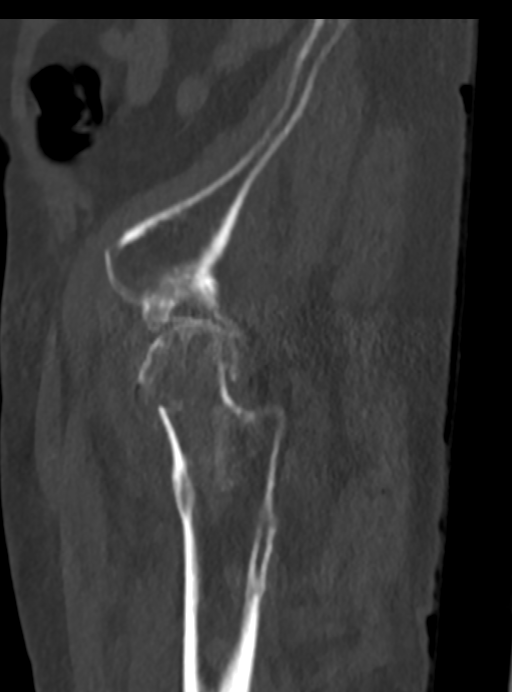
[im 68/102  bone]
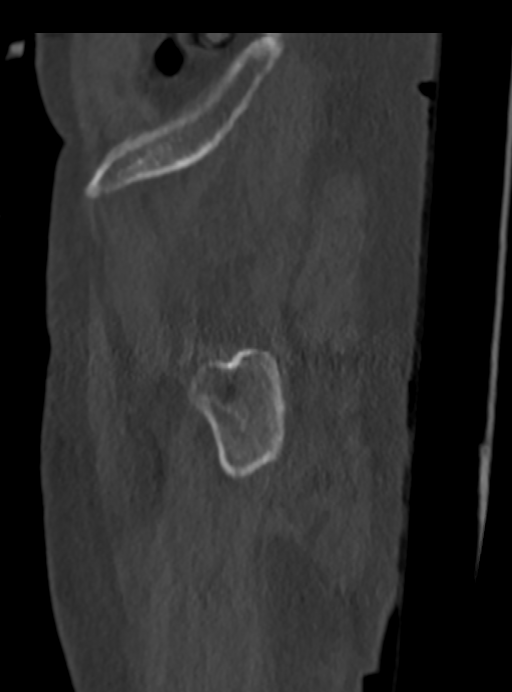
[im 85/102  bone]
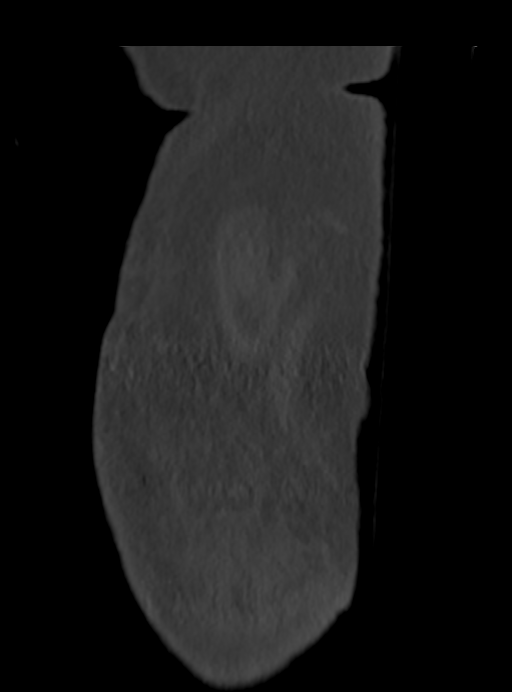

[15 of 36 positions shown; findings below may reference images not displayed]

FINDINGS: Bones/Joint/Cartilage

Acute, impacted subcapital fracture of the left femoral neck with
approximately 2 cm of foreshortening. No intertrochanteric or
intra-articular fracture component identified. Femoral head
alignment is maintained without dislocation. Moderate left hip
osteoarthritis. No additional fracture identified.

Ligaments

Suboptimally assessed by CT.

Muscles and Tendons

No intramuscular hematoma.  Tendons grossly intact.

Soft tissues

Mild soft tissue induration over the lateral aspect of the left hip,
likely posttraumatic.

Other

Extensive sigmoid diverticulosis.
IMPRESSION: Acute, impacted, subcapital fracture of the left femoral neck.

## 2020-06-01 IMAGING — DX DG HIP (WITH OR WITHOUT PELVIS) 1V PORT*L*
2 series · 2 of 2 positions shown · non-contrast
Comparison: None.

CLINICAL DATA: Left hip replacement.

EXAM:
DG HIP (WITH OR WITHOUT PELVIS) 1V PORT LEFT

[hip lat (1 of 2)]
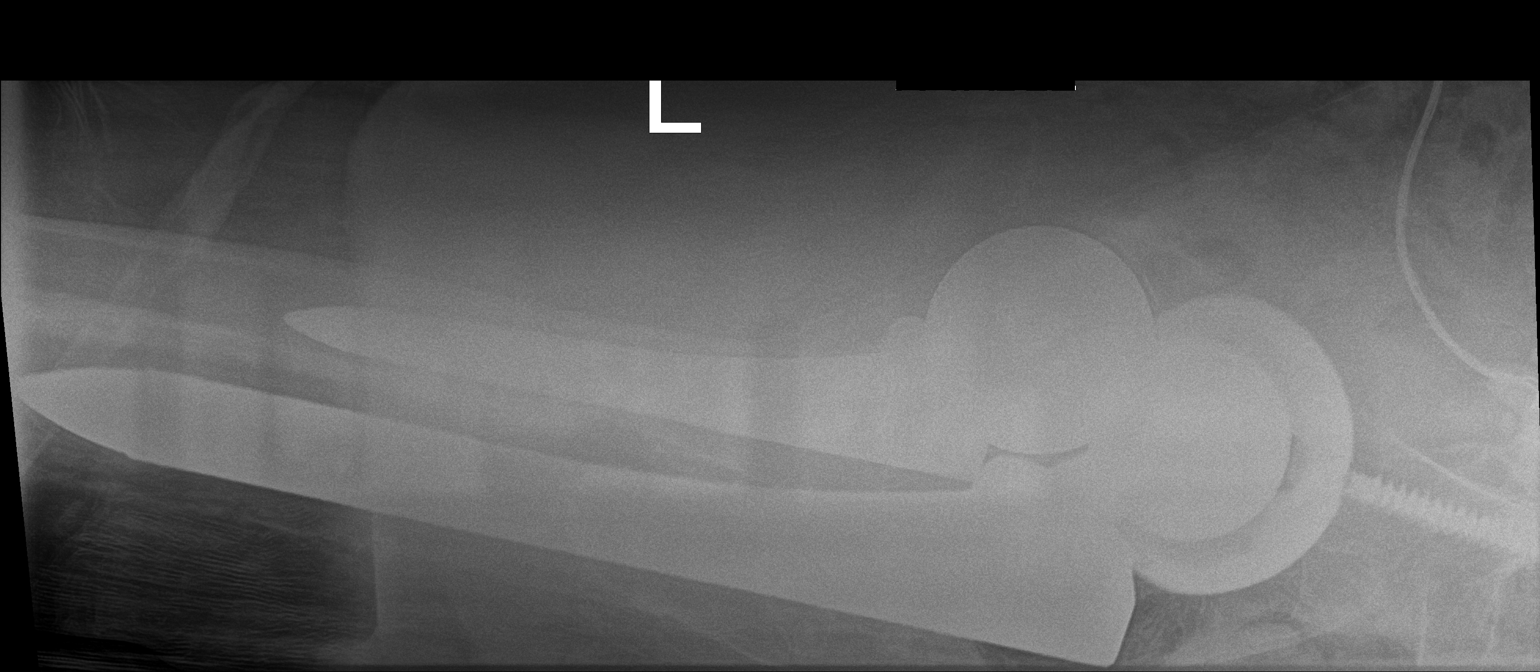

[hip lat (2 of 2)]
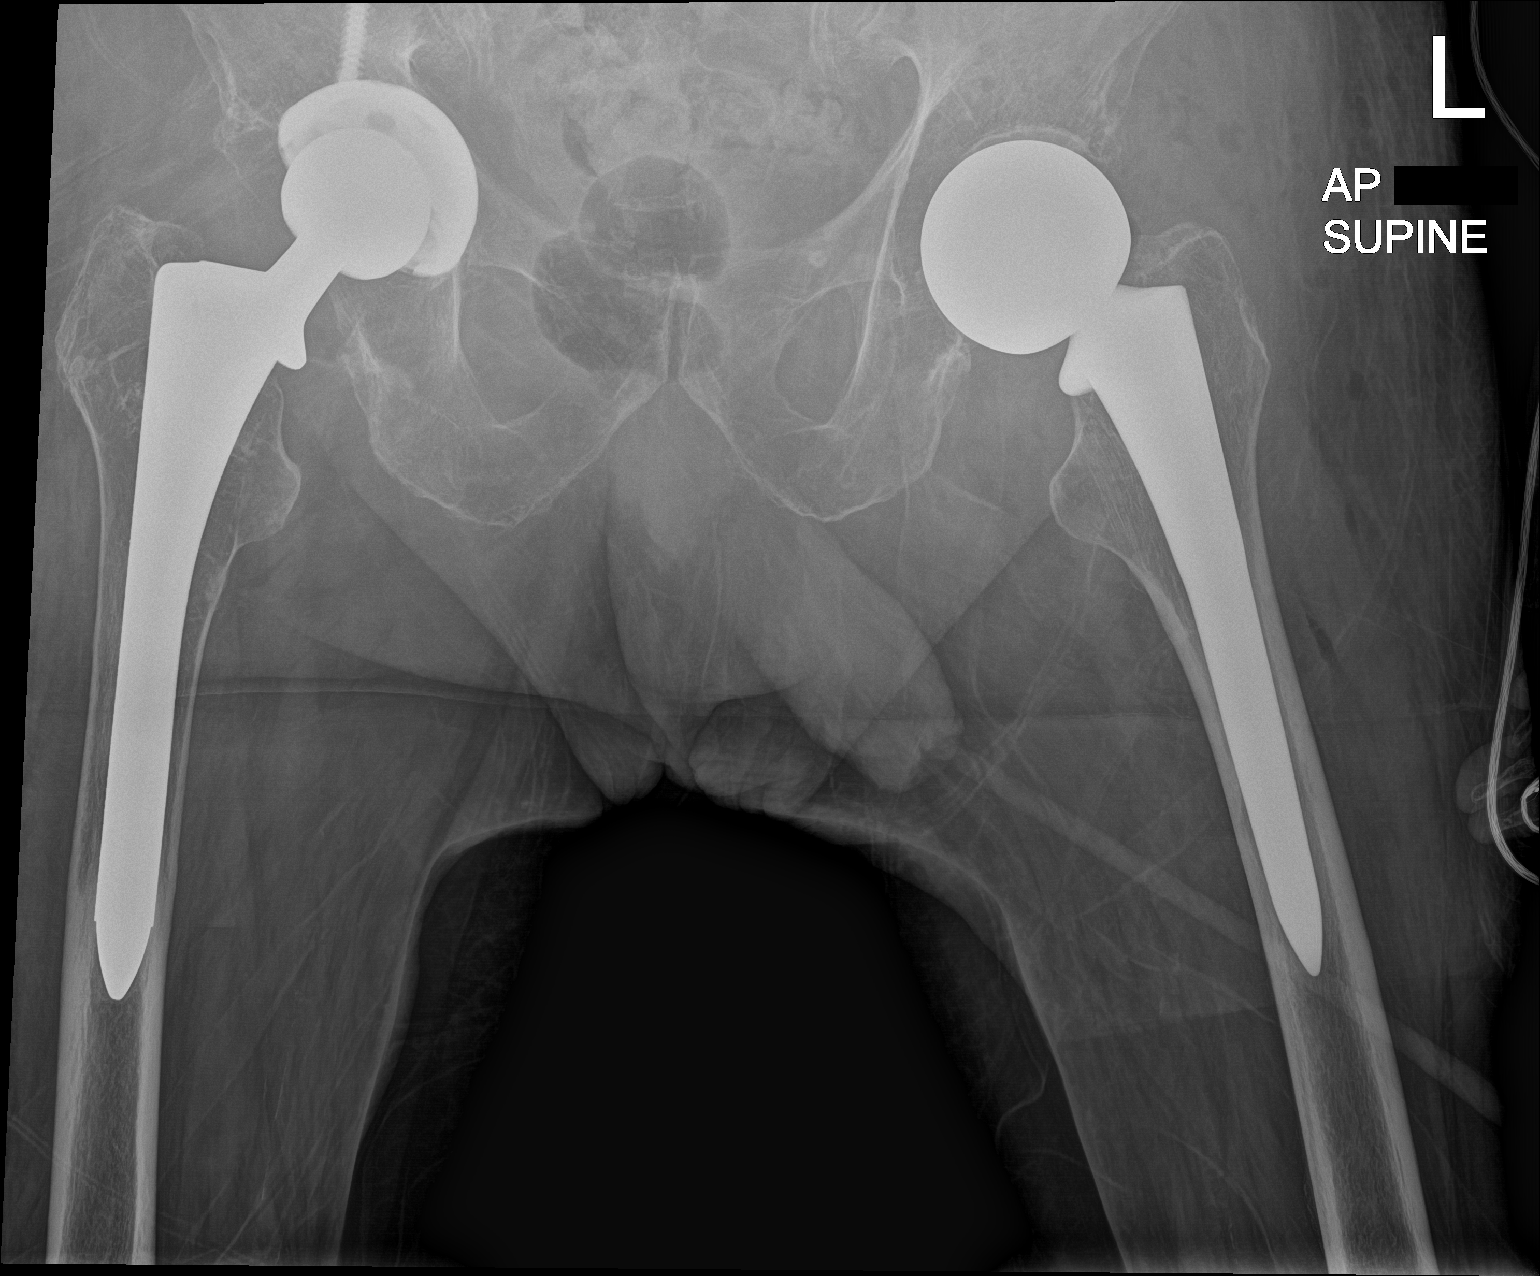

[2 of 2 positions shown; findings below may reference images not displayed]

FINDINGS: The left femoral prosthesis appears to be well situated. No fracture
or dislocation is noted. Expected postoperative changes are seen in
the surrounding soft tissues.
IMPRESSION: Status post left hip arthroplasty.
